# Patient Record
Sex: Female | Born: 1955 | Race: White | Hispanic: No | Marital: Married | Smoking: Former smoker
Health system: Southern US, Community
[De-identification: ages and names within clinical notes are randomized; demographics above are authoritative.]

## PROBLEM LIST (undated history)

## (undated) DIAGNOSIS — Z8719 Personal history of other diseases of the digestive system: Secondary | ICD-10-CM

## (undated) DIAGNOSIS — K529 Noninfective gastroenteritis and colitis, unspecified: Secondary | ICD-10-CM

## (undated) DIAGNOSIS — J40 Bronchitis, not specified as acute or chronic: Secondary | ICD-10-CM

## (undated) DIAGNOSIS — E785 Hyperlipidemia, unspecified: Secondary | ICD-10-CM

## (undated) HISTORY — PX: MOHS SURGERY: SUR867

## (undated) HISTORY — DX: Bronchitis, not specified as acute or chronic: J40

## (undated) HISTORY — DX: Personal history of other diseases of the digestive system: Z87.19

## (undated) HISTORY — DX: Hyperlipidemia, unspecified: E78.5

## (undated) HISTORY — PX: APPENDECTOMY: SHX54

---
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
## 1981-04-11 HISTORY — PX: HERNIA REPAIR: SHX51

## 1994-04-11 HISTORY — PX: EYE SURGERY: SHX253

## 1999-06-09 ENCOUNTER — Other Ambulatory Visit: Admission: RE | Admit: 1999-06-09 | Discharge: 1999-06-09 | Payer: Self-pay | Admitting: Obstetrics and Gynecology

## 2000-06-14 ENCOUNTER — Other Ambulatory Visit: Admission: RE | Admit: 2000-06-14 | Discharge: 2000-06-14 | Payer: Self-pay | Admitting: Obstetrics and Gynecology

## 2003-01-29 ENCOUNTER — Encounter: Admission: RE | Admit: 2003-01-29 | Discharge: 2003-01-29 | Payer: Self-pay

## 2004-04-19 ENCOUNTER — Encounter: Admission: RE | Admit: 2004-04-19 | Discharge: 2004-04-19 | Payer: Self-pay

## 2004-07-28 ENCOUNTER — Encounter (INDEPENDENT_AMBULATORY_CARE_PROVIDER_SITE_OTHER): Payer: Self-pay | Admitting: Specialist

## 2004-07-28 ENCOUNTER — Ambulatory Visit (HOSPITAL_BASED_OUTPATIENT_CLINIC_OR_DEPARTMENT_OTHER): Admission: RE | Admit: 2004-07-28 | Discharge: 2004-07-28 | Payer: Self-pay | Admitting: General Surgery

## 2004-07-28 ENCOUNTER — Ambulatory Visit (HOSPITAL_COMMUNITY): Admission: RE | Admit: 2004-07-28 | Discharge: 2004-07-28 | Payer: Self-pay | Admitting: General Surgery

## 2004-10-13 ENCOUNTER — Other Ambulatory Visit: Admission: RE | Admit: 2004-10-13 | Discharge: 2004-10-13 | Payer: Self-pay | Admitting: *Deleted

## 2005-05-11 ENCOUNTER — Encounter: Admission: RE | Admit: 2005-05-11 | Discharge: 2005-05-11 | Payer: Self-pay | Admitting: *Deleted

## 2005-11-15 ENCOUNTER — Other Ambulatory Visit: Admission: RE | Admit: 2005-11-15 | Discharge: 2005-11-15 | Payer: Self-pay | Admitting: *Deleted

## 2006-04-11 HISTORY — PX: DILATION AND CURETTAGE OF UTERUS: SHX78

## 2006-08-04 ENCOUNTER — Ambulatory Visit: Payer: Self-pay | Admitting: Internal Medicine

## 2006-08-04 LAB — CONVERTED CEMR LAB
ALT: 14 units/L (ref 0–40)
AST: 18 units/L (ref 0–37)
Basophils Relative: 2.1 % — ABNORMAL HIGH (ref 0.0–1.0)
Bilirubin, Direct: 0.1 mg/dL (ref 0.0–0.3)
CO2: 31 meq/L (ref 19–32)
Chloride: 108 meq/L (ref 96–112)
Eosinophils Absolute: 0.1 10*3/uL (ref 0.0–0.6)
Eosinophils Relative: 1.7 % (ref 0.0–5.0)
GFR calc Af Amer: 113 mL/min
GFR calc non Af Amer: 93 mL/min
Glucose, Bld: 90 mg/dL (ref 70–99)
HCT: 36.9 % (ref 36.0–46.0)
Lymphocytes Relative: 30.8 % (ref 12.0–46.0)
MCV: 84.2 fL (ref 78.0–100.0)
Neutro Abs: 3.6 10*3/uL (ref 1.4–7.7)
Neutrophils Relative %: 58.4 % (ref 43.0–77.0)
Sodium: 144 meq/L (ref 135–145)
TSH: 1.24 microintl units/mL (ref 0.35–5.50)
Tissue Transglutaminase Ab, IgA: <3 units (ref <5)
Total Protein: 6.7 g/dL (ref 6.0–8.3)
WBC: 6.1 10*3/uL (ref 4.5–10.5)

## 2006-08-09 ENCOUNTER — Ambulatory Visit: Payer: Self-pay | Admitting: Internal Medicine

## 2006-08-09 ENCOUNTER — Encounter: Payer: Self-pay | Admitting: Internal Medicine

## 2006-08-15 ENCOUNTER — Ambulatory Visit: Payer: Self-pay | Admitting: Cardiology

## 2006-08-25 ENCOUNTER — Ambulatory Visit: Payer: Self-pay | Admitting: Internal Medicine

## 2007-11-25 ENCOUNTER — Emergency Department (HOSPITAL_COMMUNITY): Admission: EM | Admit: 2007-11-25 | Discharge: 2007-11-25 | Payer: Self-pay | Admitting: Emergency Medicine

## 2007-11-26 ENCOUNTER — Encounter (INDEPENDENT_AMBULATORY_CARE_PROVIDER_SITE_OTHER): Payer: Self-pay | Admitting: Surgery

## 2007-11-26 ENCOUNTER — Ambulatory Visit (HOSPITAL_COMMUNITY): Admission: EM | Admit: 2007-11-26 | Discharge: 2007-11-27 | Payer: Self-pay | Admitting: Emergency Medicine

## 2007-11-26 ENCOUNTER — Encounter: Admission: RE | Admit: 2007-11-26 | Discharge: 2007-11-26 | Payer: Self-pay | Admitting: Gastroenterology

## 2007-12-31 ENCOUNTER — Encounter: Admission: RE | Admit: 2007-12-31 | Discharge: 2007-12-31 | Payer: Self-pay | Admitting: Gastroenterology

## 2009-05-12 ENCOUNTER — Encounter: Admission: RE | Admit: 2009-05-12 | Discharge: 2009-05-12 | Payer: Self-pay | Admitting: Internal Medicine

## 2010-05-02 ENCOUNTER — Encounter: Payer: Self-pay | Admitting: *Deleted

## 2010-05-03 ENCOUNTER — Encounter: Payer: Self-pay | Admitting: *Deleted

## 2010-08-24 NOTE — Op Note (Signed)
Nancy Smith, Nancy Smith NO.:  [id number]   MEDICAL RECORD NO.:  [id number]          PATIENT TYPE:  OIB   LOCATION:  5123                         FACILITY:  [facility]   PHYSICIAN:  Ardeth Sportsman, MD     DATE OF BIRTH:  1956-01-14   DATE OF PROCEDURE:  DATE OF DISCHARGE:  11/27/2007                               OPERATIVE REPORT   PRIMARY CARE PHYSICIAN:  None.   GASTROENTEROLOGIST:  Jordan Hawks. Elnoria Howard, MD   SURGEON:  Ardeth Sportsman, MD   ASSISTANT:  None.   PREOPERATIVE DIAGNOSIS:  Acute appendicitis.   SECONDARY PREOPERATIVE DIAGNOSES:  1. Chronic upper abdominal pain.  2. Benign liver cysts.   POSTOPERATIVE DIAGNOSIS:  Acute perforated appendicitis with  suppuration.   SECONDARY POSTOPERATIVE DIAGNOSES:  1. Chronic upper abdominal pain.  2. Benign liver cysts.   PROCEDURE PERFORMED:  1. Diagnostic laparoscopy.  2. Laparoscopic appendectomy.   ANESTHESIA:  1. General anesthesia.  2. Local anesthetic and a field block on all port sites.   SPECIMENS:  Appendix.   DRAINS:  None.   ESTIMATED BLOOD LOSS:  Less than 5 mL.   COMPLICATIONS:  None.   INDICATIONS:  Ms. Gao is a 55 year old female with chronic upper  abdominal pain, history of lymphocytic colitis in the past.  She had  some worsening abdominal pain and went to the emergency room.  Her  workup was initially negative including an negative upper ultrasound.  She was sent over to see Dr. Jeani Hawking with Gastroenterology.  He  evaluated her and was more concerned about how her pain seemed more  severe.  He sent for a CT scan of the abdomen, which was consistent with  appendicitis, and therefore a surgical consultation was made.   Her pain seems more focal in the right lower quadrant.  She is rather  anxious, but we have been able to control her pain and anxiety.  The  anatomy and physiology of appendix function was explained.  Pathophysiology of appendicitis was explained.  Risks,  benefits, and  alternatives were discussed and recommendation was made for diagnostic  laparoscopy with appendectomy.  Risks, benefits, and alternatives were  discussed.  Questions were answered and she agreed to proceed.   OPERATIVE FINDINGS:  She had obvious appendicitis with suppuration  concerned with perforation.  She had some murky fluid down in her pelvis  but not in her right upper quadrant.  Her ovaries, fallopian tube,  adnexa and uterus were normal.  Her small bowel was completely normal.  The colon was normal.  Her liver did have some benign hepatic cyst, but  these have been noted in the past and were stable.  She had no evidence  of any hiatal hernia.  There is no evidence of any adhesions or hernias.   DESCRIPTION OF PROCEDURE:  Informed consent was confirmed.  The patient  was already on IV Unasyn.  She received IV pain medications and IV  fluids.  She underwent general anesthesia without any difficulty.  She  had a Foley catheter sterilely placed.  She was positioned supine, both  arms tucked.  Her abdomen is clipped, prepped, and draped in sterile  fashion.   A 5-mm port was placed in right upper quadrant with the patient's deep  reversed in Trendelenburg and right side up using optical entry  technique.  Capnoperitoneum on 15 mmHg provided a good intraabdominal  insufflation.  Under direct visualization, the 5-mm port was placed  periumbilically and a 12-mm port was placed in the suprapubic region  above the tunneled cephalad.   Camera inspection revealed findings as above.  I was able to free some  omental adhesions that were over the right lower quadrant.  I was able  to roll the right colon in the lateral to medial fashion.  The appendix  could be seen.  I was able to elevate it.  The appendiceal mesentery was  isolated and divided using ultrasonic dissection till I got to a good  healthy base.  I went ahead and stapled off the appendix using a  laparoscopic  stapler taking a healthy cuff of cecum.   Copious irrigation was done with over 4 liters, especially in the right  lower quadrant, pelvis, and right upper quadrant, with clear return at  the end.  I ran the small bowel from the terminal ileum all the way to  the ligament of Treitz.  It was normal except for some mild inflammation  of the ileum in the right lower quadrant from the periappendicitis .  Small bowel mesentery appeared normal as well.  Camera inspection  revealed up in the upper quadrant I could see some obvious benign simple  liver cysts.  There was no evidence of any other liver lesions.  The  gallbladder appeared normal.  There was no evidence of any gallbladder  wall thickening or other abnormalities.  I elevated her liver anteriorly  and I was able to see esophageal hiatus with no evidence of any hiatal  hernia.  Her stomach appeared normal with no obvious masses.  There is  no anterior loop adhesions or intra-abdominal adhesions.  Her adnexa  looked completely normal including bilateral ovaries, fallopian tubes,  and uterus as well, and otherwise normal.  There was no Meckel  diverticulum.  There was no inflamed terminal ileum.  There was no  obvious colitis.   Capnoperitoneum was evacuated and ports removed.  The suprapubic fascial  defect barely allowed my finger to pass and closed with a 0 Vicryl  stitch to the fascia.  The skin was closed using 4-0 Monocryl stitch.  Sterile dressing was applied.  The patient was extubated and was taken  to recovery room in stable condition.   I explained the operative findings to the patient's husband.      Ardeth Sportsman, MD  Electronically Signed     SCG/MEDQ  D:  11/26/2007  T:  11/27/2007  Job:  [id number]   cc:   Jordan Hawks. Elnoria Howard, MD

## 2010-08-24 NOTE — H&P (Signed)
NAMEALBERTIA, CARVIN NO.:  [id number]   MEDICAL RECORD NO.:  [id number]          PATIENT TYPE:  INP   LOCATION:  5123                         FACILITY:  [facility]   PHYSICIAN:  Ardeth Sportsman, MD     DATE OF BIRTH:  1956-01-27   DATE OF ADMISSION:  11/26/2007  DATE OF DISCHARGE:                              HISTORY & PHYSICAL   CHIEF COMPLAINT:  Right lower quadrant abdominal pain.   HISTORY OF PRESENT ILLNESS:  Ms. Caputi is a 55 year old white lady who  has a history of chronic epigastric and left upper quadrant abdominal  pain.  She states that she has had this for years.  She came to the  emergency department yesterday due to this pain, however, had a negative  workup with a negative abdominal ultrasound and negative lab.  At that  time, it was felt that she needed an upper endoscopy, and therefore, was  sent to see Dr. Elnoria Howard, to see with first GI doctor they could get her in  to see for an EGD.  However, today when she presented to his office  apparently, she was having quite a bit more abdominal pain, and at this  time, it was located more at her right lower quadrant.  Therefore, at  this time, he felt that she needed to have CT scan done to rule out  appendicitis.  The patient went to have the CT scan done, and at that  time, she was found to have acute appendicitis with no perforation at  that time.  Therefore, she was sent over here to [facility] Emergency  Department again, and we were asked to see the patient.  Currently at  this time, the patient has labs pending.  Also of note, while here on  initial exam, the patient was having some right lower quadrant abdominal  pain, however, was not in very much distress.  However, throughout the  interview process and after the abdominal exam, the patient began to  have severe pain and became hyperventilatory and severe nausea.   REVIEW OF SYSTEMS:  See HPI.  Otherwise, the patient is currently  complaining of headache  and severe nausea.  She also complains of some  chronic epigastric pain and left upper quadrant abdominal pain with an  unknown etiology.  Otherwise, all other systems are currently negative.   FAMILY HISTORY:  Noncontributory.   PAST MEDICAL HISTORY:  1. Lymphocytic colitis.  2. Melanoma of the leg and arm.  3. Chronic epigastric/left upper quadrant abdominal pain.   PAST SURGICAL HISTORY:  1. The patient has had a recent D&C performed.  2. Right inguinal hernia repair in 1986, I believe.  3. Tonsillectomy.  4. Excision of melanoma on the patient's left upper extremity.   SOCIAL HISTORY:  The patient is married.  She does not have any children  of her own; however, she does have step kids, as well as grandchildren.  Otherwise, she is a retired Engineer, site; however, she has foregone  retirement, and is now teaching again.  Otherwise, she denies any  alcohol, tobacco, or illicit drug  use.   ALLERGIES:  NKDA.   MEDICATIONS:  None.   PHYSICAL EXAMINATION:  GENERAL:  Ms. Ellett is a 55 year old white  female who originally was in no acute distress; however, as patient's  interview continued, she began to get in moderate distress and  hyperventilating due to pain and nausea.  HEENT:  Eyes, sclerae noninjected.  Pupils are equal, round, and  reactive to light.  Ears, nose, mouth, and throat:  Ears and nose with  no obvious masses or lesions.  No rhinorrhea.  Mouth is pink and moist.  Throat shows no exudate.  NECK:  Supple.  Trachea is midline.  No thyromegaly.  HEART:  Regular rate and rhythm.  Normal S1 and S2.  No murmurs,  gallops, or rubs were noted.  Plus 2 carotid, pedal, and regular pulses  bilaterally.  LUNGS:  Clear to auscultation bilaterally with no wheezes, rhonchi, or  rales noted.  Respiratory effort at that time on exam was nonlabored and  is currently still unlabored; however, she is somewhat tachypneic due to  pain and anxiety.  CHEST:  Symmetrical.   ABDOMEN:  Soft, nondistended, however, is very tender in the right lower  quadrant with positive rebound tenderness.  She has currently decreased  bowel sounds with a negative psoas and obturator signs.  Otherwise, she  does not have any obvious scars, masses, or hernias on her abdomen.  MUSCULOSKELETAL:  All 4 extremities are symmetrical with no cyanosis,  clubbing, or edema.  SKIN:  Warm and dry with no obvious masses, lesions, or rashes.  NEUROLOGIC:  Cranial nerves II through XII appeared to be grossly  intact.  Deep tendon reflex exam is deferred at this time.  PSYCH:  The patient is alert and oriented x3 with an appropriate affect.   LABORATORY DATA AND DIAGNOSTICS:  All labs are currently pending.  We  have ordered a CBC with this and a BMET.  CT scan of the abdomen and  pelvis shows acute appendicitis with periappendiceal inflammatory  stranding; however, it does not appear to be ruptured at this time.   IMPRESSION:  1. Acute appendicitis.  2. Lymphocytic colitis.  3. Melanoma, which has been excised.   PLAN:  At this time, we will plan to admit this patient and start her on  p.r.n. medicines such as Zofran, Phenergan, and morphine, as well as  Unasyn 3 grams IV q.6 h.  Otherwise, we will also start her on IV  fluids.  At this time, the patient has been informed that she will need  her appendix out.  I have explained to her about the laparoscopic  approach for an  appendectomy.  She understands this and wishes to  proceed.  I have also at this time discussed this patient with Dr.  Michaell Cowing, and he feels as well that this patient will need her appendix  out.  Otherwise, we have ordered labs to be drawn, and we will further  await those results.     Letha Cape, PA      Ardeth Sportsman, MD  Electronically Signed   KEO/MEDQ  D:  11/26/2007  T:  11/27/2007  Job:  [id number]

## 2010-08-24 NOTE — Assessment & Plan Note (Signed)
[facility]                         GASTROENTEROLOGY OFFICE NOTE   Nancy Smith, Nancy Smith                       MRN:          [id number]  DATE:08/25/2006                            DOB:          [birth year]    HISTORY:  Nancy Smith presents today for a followup. Nancy Smith is accompanied  by Nancy Smith husband. Nancy Smith is a 55 year old with a history of superficial  melanoma who was evaluated as a new patient August 04, 2006 regarding  chronic diarrhea and persistent right upper quadrant abdominal  discomfort (please see that dictation for details). Nancy Smith underwent  multiple laboratories including CBC, comprehensive metabolic panel,  thyroid stimulation hormone, and tissue transglutaminase antibody. These  were all normal. Nancy Smith also underwent stool studies which were negative  for enteric pathogens, ova and parasites, white blood cells and fecal  fat. Also, no evidence of clostridium difficile. Colonoscopy and upper  endoscopy were then performed. Colonoscopy including intubation of the  terminal ileum was entirely normal. However random biopsies of the colon  revealed evidence of lymphocytic colitis. I reviewed these with Dr. Jimmy Picket. Upper endoscopy was entirely normal. Duodenal biopsies were  normal. Due to the pathology results being known, Nancy Smith was placed on  metronidazole 250 mg q.i.d. for 1 week. Nancy Smith completed therapy. Nancy Smith was  set up for office followup at this time. It should also be noted, that  Nancy Smith underwent contrast enhanced CT scan of the abdomen and pelvis to  evaluate Nancy Smith persistent right upper quadrant pain as well as followup on  liver lesions seen on an outside ultrasound in October 2007.  Parenthetically, there was no evidence of gallbladder disease at that  time. 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 The hemangioma  measured 2.4 cm while the cyst measured up to 2.6 cm. It  was also noted  that Nancy Smith had a 1.1 cm left renal lesion that could not be characterized  further. MRI recommended. No intraabdominal pathology to explain pain.  The patient is accompanied today by Nancy Smith husband. Nancy Smith is quite anxious  and worried. First Nancy Smith tells me that after completing a course of  antibiotics Nancy Smith felt that maybe Nancy Smith diarrhea was better. Nancy Smith describes  three loose bowel movements in the morning, none at night. Cramping  seems to be better as well though Nancy Smith did say Nancy Smith was having more  diarrhea this morning. Nancy Smith also reports to me that Nancy Smith had vaginal  spotting today. Nancy Smith states Nancy Smith has not had a period for 2 years. This  concerns Nancy Smith. I reviewed the above findings with Nancy Smith. With regards to  the kidney lesion, Nancy Smith tells me that Nancy Smith has seen Dr. Laverle Patter before and  apparently is noted to have some lesion on Nancy Smith left kidney. I told Nancy Smith  that we will ask Nancy Smith to followup with him regarding this question.  Finally, a long discussion today regarding problems with anxieties and  worries. Nancy Smith has apparently been on Paxil and Lexapro in the past and  done well in terms of how they made Nancy Smith  feel. However, there was some  concern that Nancy Smith maybe gained weight on these agents is petrified of  gaining weight. This is interesting in that one of Nancy Smith chief complaints  is ongoing weight loss that concerns Nancy Smith. Nancy Smith has fears about all sorts  of different problems and continues to ask if this could be a problem  or that could be a problem. Nancy Smith continues with the fullness discomfort  in the right side. Nancy Smith is currently on no medications. Nancy Smith takes Xanax  at night rarely to help with insomnia.   PHYSICAL EXAMINATION:  GENERAL:  Anxious, depressed-appearing female in  no acute distress.  VITAL SIGNS:  Blood pressure is 104/64, heart rate 72, weight is 119.8  pounds (decreased 1.4 pounds).  HEENT:  Sclera anicteric, conjunctiva pink. Oral mucosa intact. No  adenopathy.  LUNGS:  Clear.  HEART:   Regular.  ABDOMEN:  Soft without tenderness, mass or hernia. Good bowel sounds  heard.  EXTREMITIES:  Without edema.   IMPRESSION:  1. A 6 month history of diarrhea due to mild lymphocytic (a variety of      microscopic colitis). Workup for other problems entirely negative.  2. Chronic right upper quadrant discomfort. No organic cause found.      Ongoing.  3. Lesion on left kidney as described.  4. New problem with vaginal spotting.  5. Significant anxiety with probable element of depression.   RECOMMENDATIONS:  1. Prescribed Levbid 1/2 to 1 p.o. b.i.d. as needed for cramping and      loose stools.  2. Prescribed Zoloft 50 mg daily for anxiety with depression. We      discussed the pros and cons. Nancy Smith agreed.  3. See Dr. Randell Patient regarding vaginal spotting.  4. See Dr. Laverle Patter regarding left kidney lesion. A copy of Nancy Smith CT scan      report was handed to Nancy Smith.  5. Reassurance.  6. GI office followup in 6 weeks. Nancy Smith may contact the office in the      interim for questions or problems.     [name]. Marina Goodell, MD     JNP/MedQ  DD: 08/25/2006  DT: 08/25/2006  Job #: [id number]   cc:   Almedia Balls. Randell Patient, M.D.  Ronald Pippins, MD  Crecencio Mc, M.D.

## 2010-08-27 NOTE — Op Note (Signed)
NAMETALYIA, ALLENDE              ACCOUNT NO.:  [id number]   MEDICAL RECORD NO.:  [id number]          PATIENT TYPE:  AMB   LOCATION:  DSC                          FACILITY:  [facility]   PHYSICIAN:  Rose Phi. Maple Hudson, M.D.   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   OPERATION PERFORMED:  Excision of melanoma with primary closure.   SURGEON:  Rose Phi. Maple Hudson, M.D.   ANESTHESIA:  Local.   DESCRIPTION OF PROCEDURE:  Patient placed on the operating table and left  upper arm prepped and draped in the usual fashion.  An elliptical incision  around the previous biopsy site was then outlined with a marking pencil and  the area thoroughly infiltrated with 1% Xylocaine with Adrenalin.  Incision  was made and it was excised.  This left a defect of about 3 x 1.5 cm.  It  was closed in a single layer of interrupted 4-0 nylon.  Dressing applied.  Patient then allowed to go home.      PRY/MEDQ  D:  07/28/2004  T:  07/28/2004  Job:  [id number]

## 2010-08-27 NOTE — Assessment & Plan Note (Signed)
[facility]                         GASTROENTEROLOGY OFFICE NOTE   Smith, Nancy                       MRN:          [id number]  DATE:08/04/2006                            DOB:          May 29, 1955    REFERRING PHYSICIAN:  Almedia Balls. Randell Patient, M.D.   REASON FOR CONSULTATION:  1. Chronic diarrhea.  2. Persistent right upper quadrant abdominal discomfort.   HISTORY:  This is a 55 year old white female with history of superficial  melanoma who is otherwise healthy and was in her usual state of good  health until late November of 2007, when after eating a Lance Muss sandwich  developed moderately severe mid abdominal pain.  Thereafter, she had  symptoms of early satiety with mild weight loss.  In December she  developed recurrent epigastric and mid abdominal pain.  Subsequently,  problems with daily diarrhea which is described as watery.  Occasionally  oily, occasionally floating, mostly sinking stools.  Symptoms may or may  not be exacerbated by food.  She has awoken at least once per week  during the night with cramping and the need to defecate.  She describes  the abdominal cramping as severe as well as the diarrhea.  Overall, she  has lost approximately 7 pounds.  She denies presence of blood.  She  also reports constant chronic nagging discomfort in the right upper  quadrant which is not affected by meals, activities or change in body  position.  Her only evaluation and workup was an abdominal ultrasound  obtained in 2007.  This revealed several small lesions in the liver said  to be consistent with cysts, as well as a 1.3 cm lesion in the right  liver said to be hemangioma.  Followup in 3-4 months recommended.  Gallbladder was said to be normal.  No otherwise abnormalities noted.  The patient denies fevers, chills, rash, joint aches.  No perirectal  complaints.  No prior history of similar problems.  No travel history  outside of the [place].  She  cannot tell that dietary change affects  her symptoms.  The patient did report taking Imodium on one occasion.  This did stop her diarrhea but resulted in significant bloating with  discomfort.   PAST MEDICAL HISTORY:  1. Superficial melanoma on the arm and leg on 2 separate occasions      within the past several years.  2. Status post repair of femoral hernia in 1986.  3. Tonsillectomy as a child.   ALLERGIES:  No known drug allergies.   CURRENT MEDICATIONS:  None.   FAMILY HISTORY:  Negative for gastrointestinal malignancy or  inflammatory bowel disease.  Parents with heart disease, mother with  alcoholism.   SOCIAL HISTORY:  The patient is married.  She has stepchildren and step-  grandchildren but no children of her own.  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  She does no smoke or use alcohol.   REVIEW OF SYSTEMS:  Per diagnostic evaluation form.   PHYSICAL EXAMINATION:  Well-appearing female in no acute distress.  Blood pressure 118/62, heart rate 72 and regular.  Weight is 121.2  pound.  She is 5 feet 5 inches in height.  HEENT:  Sclerae are anicteric, conjunctiva are pink, oral mucosa intact,  no adenopathy.  LUNGS:  Clear.  HEART:  Regular.  ABDOMEN:  Soft, with mild discomfort to palpation in the epigastric and  right upper quadrant region.  Good bowel sounds heard, no mass felt.  The extremities without edema.  RECTAL:  Deferred.  NEUROLOGICALLY:  She is intact.  SKIN:  Unremarkable except for solar injury.   IMPRESSION:  1. Chronic diarrhea with weight loss.  This persists now for 6 months.      No workup to date.  Multiple possible etiologies including      infectious, malabsorptive or infiltrative processes.  2. Chronic right upper quadrant pain.  3. Reported abnormalities on abdominal ultrasound in October of 2007      as discussed above.  This needs followup.    RECOMMENDATIONS:  1. Multiple laboratories today including CBC, thyroid studies,      comprehensive metabolic panel, and tissue transaminase antibody.  2. Multiple stool studies including stool for fat, leukocytes,      infectious agents, and occult blood.  3. Schedule colonoscopy with biopsies to rule out microscopic colitis,      and upper endoscopy with biopsies to rule out villous atrophy.  The      nature of the procedures as well as the risks, benefits, and      alternatives were discussed in detail.  She understood and agreed      to proceed.  4. We will plan followup CT scan of the abdomen and pelvis after the      above workup initiated.  This to evaluate pain but also follow up      hepatic abnormalities as discussed above.     Nancy Smith. Marina Goodell, MD  Electronically Signed    JNP/MedQ  DD: 08/04/2006  DT: 08/04/2006  Job #: [id number]   cc:   Almedia Balls. Randell Patient, M.D.  [address], [address], [place], [place] [address] Dr. Ronald Pippins,  [address]

## 2011-06-01 ENCOUNTER — Other Ambulatory Visit: Payer: Self-pay | Admitting: Dermatology

## 2012-06-05 ENCOUNTER — Other Ambulatory Visit: Payer: Self-pay | Admitting: Dermatology

## 2012-12-17 ENCOUNTER — Other Ambulatory Visit: Payer: Self-pay | Admitting: Dermatology

## 2013-04-24 ENCOUNTER — Other Ambulatory Visit: Payer: Self-pay

## 2013-04-24 ENCOUNTER — Ambulatory Visit
Admission: RE | Admit: 2013-04-24 | Discharge: 2013-04-24 | Disposition: A | Discharge status: Home / Self Care | Payer: BC Managed Care – PPO | Source: Ambulatory Visit | Attending: Nurse Practitioner | Admitting: Nurse Practitioner

## 2013-04-24 ENCOUNTER — Other Ambulatory Visit: Payer: Self-pay | Admitting: Nurse Practitioner

## 2013-04-24 DIAGNOSIS — Z1231 Encounter for screening mammogram for malignant neoplasm of breast: Secondary | ICD-10-CM

## 2014-05-01 ENCOUNTER — Other Ambulatory Visit: Payer: Self-pay | Admitting: Dermatology

## 2015-05-25 ENCOUNTER — Other Ambulatory Visit: Payer: Self-pay

## 2015-05-25 DIAGNOSIS — Z1231 Encounter for screening mammogram for malignant neoplasm of breast: Secondary | ICD-10-CM

## 2015-05-27 ENCOUNTER — Ambulatory Visit
Admission: RE | Admit: 2015-05-27 | Discharge: 2015-05-27 | Disposition: A | Discharge status: Home / Self Care | Payer: BC Managed Care – PPO | Source: Ambulatory Visit

## 2015-05-27 DIAGNOSIS — Z1231 Encounter for screening mammogram for malignant neoplasm of breast: Secondary | ICD-10-CM

## 2015-05-29 ENCOUNTER — Other Ambulatory Visit: Payer: Self-pay | Admitting: Internal Medicine

## 2015-05-29 DIAGNOSIS — R928 Other abnormal and inconclusive findings on diagnostic imaging of breast: Secondary | ICD-10-CM

## 2015-06-04 ENCOUNTER — Ambulatory Visit
Admission: RE | Admit: 2015-06-04 | Discharge: 2015-06-04 | Disposition: A | Discharge status: Home / Self Care | Payer: BC Managed Care – PPO | Source: Ambulatory Visit | Attending: Internal Medicine | Admitting: Internal Medicine

## 2015-06-04 DIAGNOSIS — R928 Other abnormal and inconclusive findings on diagnostic imaging of breast: Secondary | ICD-10-CM

## 2016-06-07 ENCOUNTER — Encounter: Payer: Self-pay | Admitting: Internal Medicine

## 2017-11-30 ENCOUNTER — Ambulatory Visit
Admission: RE | Admit: 2017-11-30 | Discharge: 2017-11-30 | Disposition: A | Discharge status: Home / Self Care | Payer: BC Managed Care – PPO | Source: Ambulatory Visit | Attending: Internal Medicine | Admitting: Internal Medicine

## 2017-11-30 ENCOUNTER — Other Ambulatory Visit: Payer: Self-pay | Admitting: Internal Medicine

## 2017-11-30 DIAGNOSIS — Z1231 Encounter for screening mammogram for malignant neoplasm of breast: Secondary | ICD-10-CM

## 2018-01-02 ENCOUNTER — Encounter: Payer: Self-pay | Admitting: Family Medicine

## 2018-01-02 ENCOUNTER — Ambulatory Visit (INDEPENDENT_AMBULATORY_CARE_PROVIDER_SITE_OTHER): Payer: BC Managed Care – PPO

## 2018-01-02 ENCOUNTER — Ambulatory Visit: Payer: BC Managed Care – PPO | Admitting: Family Medicine

## 2018-01-02 ENCOUNTER — Other Ambulatory Visit: Payer: Self-pay

## 2018-01-02 VITALS — BP 116/70 | HR 66 | Temp 99.2°F | Ht 65.0 in | Wt 112.8 lb

## 2018-01-02 DIAGNOSIS — R05 Cough: Secondary | ICD-10-CM

## 2018-01-02 DIAGNOSIS — J22 Unspecified acute lower respiratory infection: Secondary | ICD-10-CM

## 2018-01-02 DIAGNOSIS — R059 Cough, unspecified: Secondary | ICD-10-CM

## 2018-01-02 DIAGNOSIS — J9801 Acute bronchospasm: Secondary | ICD-10-CM | POA: Diagnosis not present

## 2018-01-02 MED ORDER — AZITHROMYCIN 250 MG PO TABS: ORAL_TABLET | ORAL | 0 refills | Status: DC

## 2018-01-02 MED ORDER — PREDNISONE 20 MG PO TABS: 40.0000 mg | ORAL_TABLET | Freq: Every day | ORAL | 0 refills | Status: DC

## 2018-01-02 MED ORDER — ALBUTEROL SULFATE (2.5 MG/3ML) 0.083% IN NEBU
2.5000 mg | INHALATION_SOLUTION | Freq: Once | RESPIRATORY_TRACT | Status: AC
  Administered 2018-01-02: 2.5 mg via RESPIRATORY_TRACT

## 2018-01-02 MED ORDER — ALBUTEROL SULFATE 108 (90 BASE) MCG/ACT IN AEPB: 1.0000 | INHALATION_SPRAY | RESPIRATORY_TRACT | 0 refills | Status: DC | PRN

## 2018-01-02 NOTE — Patient Instructions (Addendum)
Start prednisone and azithromycin for cough and possible bronchitis.  Albuterol inhaler up to every 4 hours as needed for wheezing shortness of breath.  If you are not improving in the next 48 to 72 hours, recheck.  Return to the clinic or go to the nearest emergency room if any of your symptoms worsen or new symptoms occur.  Thank you for coming in today.   Bronchospasm, Adult Bronchospasm is a tightening of the airways going into the lungs. During an episode, it may be harder to breathe. You may cough, and you may make a whistling sound when you breathe (wheeze). This condition often affects people with asthma. What are the causes? This condition is caused by swelling and irritation in the airways. It can be triggered by:  An infection (common).  Seasonal allergies.  An allergic reaction.  Exercise.  Irritants. These include pollution, cigarette smoke, strong odors, aerosol sprays, and paint fumes.  Weather changes. Winds increase molds and pollens in the air. Cold air may cause swelling.  Stress and emotional upset.  What are the signs or symptoms? Symptoms of this condition include:  Wheezing. If the episode was triggered by an allergy, wheezing may start right away or hours later.  Nighttime coughing.  Frequent or severe coughing with a simple cold.  Chest tightness.  Shortness of breath.  Decreased ability to exercise.  How is this diagnosed? This condition is usually diagnosed with a review of your medical history and a physical exam. Tests, such as lung function tests, are sometimes done to look for other conditions. The need for a chest X-ray depends on where the wheezing occurs and whether it is the first time you have wheezed. How is this treated? This condition may be treated with:  Inhaled medicines. These open up the airways and help you breathe. They can be taken with an inhaler or a nebulizer device.  Corticosteroid medicines. These may be given for  severe bronchospasm, usually when it is associated with asthma.  Avoiding triggers, such as irritants, infection, or allergies.  Follow these instructions at home: Medicines  Take over-the-counter and prescription medicines only as told by your health care provider.  If you need to use an inhaler or nebulizer to take your medicine, ask your health care provider to explain how to use it correctly. If you were given a spacer, always use it with your inhaler. Lifestyle  Reduce the number of triggers in your home. To do this: ? Change your heating and air conditioning filter at least once a month. ? Limit your use of fireplaces and wood stoves. ? Do not smoke. Do not allow smoking in your home. ? Avoid using perfumes and fragrances. ? Get rid of pests, such as roaches and mice, and their droppings. ? Remove any mold from your home. ? Keep your house clean and dust free. Use unscented cleaning products. ? Replace carpet with wood, tile, or vinyl flooring. Carpet can trap dander and dust. ? Use allergy-proof pillows, mattress covers, and box spring covers. ? Wash bed sheets and blankets every week in hot water. Dry them in a dryer. ? Use blankets that are made of polyester or cotton. ? Wash your hands often. ? Do not allow pets in your bedroom.  Avoid breathing in cold air when you exercise. General instructions  Have a plan for seeking medical care. Know when to call your health care provider and local emergency services, and where to get emergency care.  Stay up to date  on your immunizations.  When you have an episode of bronchospasm, stay calm. Try to relax and breathe more slowly.  If you have asthma, make sure you have an asthma action plan.  Keep all follow-up visits as told by your health care provider. This is important. Contact a health care provider if:  You have muscle aches.  You have chest pain.  The mucus that you cough up (sputum) changes from clear or white to  yellow, green, gray, or bloody.  You have a fever.  Your sputum gets thicker. Get help right away if:  Your wheezing and coughing get worse, even after you take your prescribed medicines.  It gets even harder to breathe.  You develop severe chest pain. Summary  Bronchospasm is a tightening of the airways going into the lungs.  During an episode of bronchospasm, you may have a harder time breathing. You may cough and make a whistling sound when you breathe (wheeze).  Avoid exposure to triggers such as smoke, dust, mold, animal dander, and fragrances.  When you have an episode of bronchospasm, stay calm. Try to relax and breathe more slowly. This information is not intended to replace advice given to you by your health care provider. Make sure you discuss any questions you have with your health care provider. Document Released: 03/31/2003 Document Revised: 03/24/2016 Document Reviewed: 03/24/2016 Elsevier Interactive Patient Education  2017 [employer]  Cough, Adult Coughing is a reflex that clears your throat and your airways. Coughing helps to heal and protect your lungs. It is normal to cough occasionally, but a cough that happens with other symptoms or lasts a long time may be a sign of a condition that needs treatment. A cough may last only 2-3 weeks (acute), or it may last longer than 8 weeks (chronic). What are the causes? Coughing is commonly caused by:  Breathing in substances that irritate your lungs.  A viral or bacterial respiratory infection.  Allergies.  Asthma.  Postnasal drip.  Smoking.  Acid backing up from the stomach into the esophagus (gastroesophageal reflux).  Certain medicines.  Chronic lung problems, including COPD (or rarely, lung cancer).  Other medical conditions such as heart failure.  Follow these instructions at home: Pay attention to any changes in your symptoms. Take these actions to help with your discomfort:  Take medicines only  as told by your health care provider. ? If you were prescribed an antibiotic medicine, take it as told by your health care provider. Do not stop taking the antibiotic even if you start to feel better. ? Talk with your health care provider before you take a cough suppressant medicine.  Drink enough fluid to keep your urine clear or pale yellow.  If the air is dry, use a cold steam vaporizer or humidifier in your bedroom or your home to help loosen secretions.  Avoid anything that causes you to cough at work or at home.  If your cough is worse at night, try sleeping in a semi-upright position.  Avoid cigarette smoke. If you smoke, quit smoking. If you need help quitting, ask your health care provider.  Avoid caffeine.  Avoid alcohol.  Rest as needed.  Contact a health care provider if:  You have new symptoms.  You cough up pus.  Your cough does not get better after 2-3 weeks, or your cough gets worse.  You cannot control your cough with suppressant medicines and you are losing sleep.  You develop pain that is getting worse or pain  that is not controlled with pain medicines.  You have a fever.  You have unexplained weight loss.  You have night sweats. Get help right away if:  You cough up blood.  You have difficulty breathing.  Your heartbeat is very fast. This information is not intended to replace advice given to you by your health care provider. Make sure you discuss any questions you have with your health care provider. Document Released: 09/24/2010 Document Revised: 09/03/2015 Document Reviewed: 06/04/2014 Elsevier Interactive Patient Education  [employer].    If you have lab work done today you will be contacted with your lab results within the next 2 weeks.  If you have not heard from [place] then please contact us. The fastest way to get your results is to register for My Chart.   IF you received an x-ray today, you will receive an invoice from [facility]  Radiology. Please contact [facility] Radiology at [phone] with questions or concerns regarding your invoice.   IF you received labwork today, you will receive an invoice from [place]. Please contact [facility] at [phone] with questions or concerns regarding your invoice.   Our billing staff will not be able to assist you with questions regarding bills from these companies.  You will be contacted with the lab results as soon as they are available. The fastest way to get your results is to activate your My Chart account. Instructions are located on the last page of this paperwork. If you have not heard from [place] regarding the results in 2 weeks, please contact this office.

## 2018-01-02 NOTE — Progress Notes (Signed)
Subjective:  By signing my name below, I, Essence Howell, attest that this documentation has been prepared under the direction and in the presence of Nancy Agreste, MD Electronically Signed: Ladene Artist, ED Scribe 01/02/2018 at 12:33 PM.   Patient ID: Nancy Smith, female    DOB: 1955-06-15, 62 y.o.   MRN: [id number]  Chief Complaint  Patient presents with  . Cough  . Nasal Congestion    since saturday a week ago (10 days)   HPI [name] is a 62 y.o. female who presents to Primary Care at [facility] complaining of nasal congestion x 10 days. Pt reports associated symptoms of cough that has improved, wheezing, rhinorrhea, sinus pressure, HA, hot and cold spells today. She has tried Mucinex and hot showers without significant relief. Denies fever, sore throat, cp, h/o asthma, h/o cardiac issues. No sick contacts.  There are no active problems to display for this patient.  No past medical history on file.  No Known Allergies Prior to Admission medications   Not on File   Social History   Socioeconomic History  . Marital status: Married    Spouse name: Not on file  . Number of children: Not on file  . Years of education: Not on file  . Highest education level: Not on file  Occupational History  . Not on file  Social Needs  . Financial resource strain: Not on file  . Food insecurity:    Worry: Not on file    Inability: Not on file  . Transportation needs:    Medical: Not on file    Non-medical: Not on file  Tobacco Use  . Smoking status: Never Smoker  . Smokeless tobacco: Never Used  Substance and Sexual Activity  . Alcohol use: Not on file  . Drug use: Not on file  . Sexual activity: Not on file  Lifestyle  . Physical activity:    Days per week: Not on file    Minutes per session: Not on file  . Stress: Not on file  Relationships  . Social connections:    Talks on phone: Not on file    Gets together: Not on file    Attends religious service: Not on file     Active member of club or organization: Not on file    Attends meetings of clubs or organizations: Not on file    Relationship status: Not on file  . Intimate partner violence:    Fear of current or ex partner: Not on file    Emotionally abused: Not on file    Physically abused: Not on file    Forced sexual activity: Not on file  Other Topics Concern  . Not on file  Social History Narrative  . Not on file   Review of Systems  Constitutional: Positive for chills and diaphoresis. Negative for fever.  HENT: Positive for congestion, rhinorrhea and sinus pressure. Negative for sore throat.   Respiratory: Positive for cough and wheezing.   Cardiovascular: Negative for chest pain.  Neurological: Positive for headaches.      Objective:   Physical Exam  Constitutional: She is oriented to person, place, and time. She appears well-developed and well-nourished. No distress.  HENT:  Head: Normocephalic and atraumatic.  Right Ear: Hearing, tympanic membrane, external ear and ear canal normal.  Left Ear: Hearing, tympanic membrane, external ear and ear canal normal.  Nose: Left sinus exhibits frontal sinus tenderness.  Mouth/Throat: Oropharynx is clear and moist. No oropharyngeal exudate.  Eyes: Pupils are equal, round, and reactive to light. Conjunctivae and EOM are normal.  Cardiovascular: Normal rate, regular rhythm, normal heart sounds and intact distal pulses.  No murmur heard. Pulmonary/Chest: Effort normal. No respiratory distress. She has wheezes. She has no rhonchi.  Diffuse expiratory wheeze.  Neurological: She is alert and oriented to person, place, and time.  Skin: Skin is warm and dry. No rash noted.  Psychiatric: She has a normal mood and affect. Her behavior is normal.  Vitals reviewed.  Vitals:   01/02/18 1158  BP: 116/70  Pulse: 66  Temp: 99.2 F (37.3 C)  TempSrc: Oral  SpO2: 95%  Weight: 112 lb 12.8 oz (51.2 kg)  Height: 5\' 5"  (1.651 m)   Albuterol 2.5 mg neb  given, some improved aeration with some persistent wheeze after  Dg Chest 2 View  Result Date: 01/02/2018 CLINICAL DATA:  Cough and chills with wheezing EXAM: CHEST - 2 VIEW COMPARISON:  None. FINDINGS: There is no edema or consolidation. Heart size and pulmonary vascularity are normal. No adenopathy. There is lower thoracic dextroscoliosis. IMPRESSION: No edema or consolidation. Electronically Signed   By: Lowella Grip III M.D.   On: 01/02/2018 13:00       Assessment & Plan:    Nancy Smith is a 62 y.o. female Cough - Plan: DG Chest 2 View  Bronchospasm - Plan: albuterol (PROVENTIL) (2.5 MG/3ML) 0.083% nebulizer solution 2.5 mg, Albuterol Sulfate (PROAIR RESPICLICK) 409 (90 Base) MCG/ACT AEPB  LRTI (lower respiratory tract infection) - Plan: predniSONE (DELTASONE) 20 MG tablet, Albuterol Sulfate (PROAIR RESPICLICK) 735 (90 Base) MCG/ACT AEPB, azithromycin (ZITHROMAX) 250 MG tablet  Possible asthmatic bronchitis/reactive airway.  Less likely CAP with reassuring x-ray.  Some improvement with albuterol neb.  Persistent wheeze but vitals reassuring and did have some improvement  -Z-Pak #1 for atypical coverage, prednisone 40 mg daily x5 days, potential side effects and risk discussed, albuterol inhaler every 4 hours as needed with RTC precautions if frequent/persistent need in 48 to 72 hours.  RTC precautions if worsening sooner.   Meds ordered this encounter  Medications  . albuterol (PROVENTIL) (2.5 MG/3ML) 0.083% nebulizer solution 2.5 mg  . predniSONE (DELTASONE) 20 MG tablet    Sig: Take 2 tablets (40 mg total) by mouth daily with breakfast.    Dispense:  10 tablet    Refill:  0  . Albuterol Sulfate (PROAIR RESPICLICK) 329 (90 Base) MCG/ACT AEPB    Sig: Inhale 1 Inhaler into the lungs every 4 (four) hours as needed.    Dispense:  1 each    Refill:  0  . azithromycin (ZITHROMAX) 250 MG tablet    Sig: Take 2 pills by mouth on day 1, then 1 pill by mouth per day on days 2  through 5.    Dispense:  6 tablet    Refill:  0   Patient Instructions    Start prednisone and azithromycin for cough and possible bronchitis.  Albuterol inhaler up to every 4 hours as needed for wheezing shortness of breath.  If you are not improving in the next 48 to 72 hours, recheck.  Return to the clinic or go to the nearest emergency room if any of your symptoms worsen or new symptoms occur.  Thank you for coming in today.   Bronchospasm, Adult Bronchospasm is a tightening of the airways going into the lungs. During an episode, it may be harder to breathe. You may cough, and you may make a whistling sound when  you breathe (wheeze). This condition often affects people with asthma. What are the causes? This condition is caused by swelling and irritation in the airways. It can be triggered by:  An infection (common).  Seasonal allergies.  An allergic reaction.  Exercise.  Irritants. These include pollution, cigarette smoke, strong odors, aerosol sprays, and paint fumes.  Weather changes. Winds increase molds and pollens in the air. Cold air may cause swelling.  Stress and emotional upset.  What are the signs or symptoms? Symptoms of this condition include:  Wheezing. If the episode was triggered by an allergy, wheezing may start right away or hours later.  Nighttime coughing.  Frequent or severe coughing with a simple cold.  Chest tightness.  Shortness of breath.  Decreased ability to exercise.  How is this diagnosed? This condition is usually diagnosed with a review of your medical history and a physical exam. Tests, such as lung function tests, are sometimes done to look for other conditions. The need for a chest X-ray depends on where the wheezing occurs and whether it is the first time you have wheezed. How is this treated? This condition may be treated with:  Inhaled medicines. These open up the airways and help you breathe. They can be taken with an  inhaler or a nebulizer device.  Corticosteroid medicines. These may be given for severe bronchospasm, usually when it is associated with asthma.  Avoiding triggers, such as irritants, infection, or allergies.  Follow these instructions at home: Medicines  Take over-the-counter and prescription medicines only as told by your health care provider.  If you need to use an inhaler or nebulizer to take your medicine, ask your health care provider to explain how to use it correctly. If you were given a spacer, always use it with your inhaler. Lifestyle  Reduce the number of triggers in your home. To do this: ? Change your heating and air conditioning filter at least once a month. ? Limit your use of fireplaces and wood stoves. ? Do not smoke. Do not allow smoking in your home. ? Avoid using perfumes and fragrances. ? Get rid of pests, such as roaches and mice, and their droppings. ? Remove any mold from your home. ? Keep your house clean and dust free. Use unscented cleaning products. ? Replace carpet with wood, tile, or vinyl flooring. Carpet can trap dander and dust. ? Use allergy-proof pillows, mattress covers, and box spring covers. ? Wash bed sheets and blankets every week in hot water. Dry them in a dryer. ? Use blankets that are made of polyester or cotton. ? Wash your hands often. ? Do not allow pets in your bedroom.  Avoid breathing in cold air when you exercise. General instructions  Have a plan for seeking medical care. Know when to call your health care provider and local emergency services, and where to get emergency care.  Stay up to date on your immunizations.  When you have an episode of bronchospasm, stay calm. Try to relax and breathe more slowly.  If you have asthma, make sure you have an asthma action plan.  Keep all follow-up visits as told by your health care provider. This is important. Contact a health care provider if:  You have muscle aches.  You have  chest pain.  The mucus that you cough up (sputum) changes from clear or white to yellow, green, gray, or bloody.  You have a fever.  Your sputum gets thicker. Get help right away if:  Your wheezing  and coughing get worse, even after you take your prescribed medicines.  It gets even harder to breathe.  You develop severe chest pain. Summary  Bronchospasm is a tightening of the airways going into the lungs.  During an episode of bronchospasm, you may have a harder time breathing. You may cough and make a whistling sound when you breathe (wheeze).  Avoid exposure to triggers such as smoke, dust, mold, animal dander, and fragrances.  When you have an episode of bronchospasm, stay calm. Try to relax and breathe more slowly. This information is not intended to replace advice given to you by your health care provider. Make sure you discuss any questions you have with your health care provider. Document Released: 03/31/2003 Document Revised: 03/24/2016 Document Reviewed: 03/24/2016 Elsevier Interactive Patient Education  2017 [employer] Inc.  Cough, Adult Coughing is a reflex that clears your throat and your airways. Coughing helps to heal and protect your lungs. It is normal to cough occasionally, but a cough that happens with other symptoms or lasts a long time may be a sign of a condition that needs treatment. A cough may last only 2-3 weeks (acute), or it may last longer than 8 weeks (chronic). What are the causes? Coughing is commonly caused by:  Breathing in substances that irritate your lungs.  A viral or bacterial respiratory infection.  Allergies.  Asthma.  Postnasal drip.  Smoking.  Acid backing up from the stomach into the esophagus (gastroesophageal reflux).  Certain medicines.  Chronic lung problems, including COPD (or rarely, lung cancer).  Other medical conditions such as heart failure.  Follow these instructions at home: Pay attention to any changes in your  symptoms. Take these actions to help with your discomfort:  Take medicines only as told by your health care provider. ? If you were prescribed an antibiotic medicine, take it as told by your health care provider. Do not stop taking the antibiotic even if you start to feel better. ? Talk with your health care provider before you take a cough suppressant medicine.  Drink enough fluid to keep your urine clear or pale yellow.  If the air is dry, use a cold steam vaporizer or humidifier in your bedroom or your home to help loosen secretions.  Avoid anything that causes you to cough at work or at home.  If your cough is worse at night, try sleeping in a semi-upright position.  Avoid cigarette smoke. If you smoke, quit smoking. If you need help quitting, ask your health care provider.  Avoid caffeine.  Avoid alcohol.  Rest as needed.  Contact a health care provider if:  You have new symptoms.  You cough up pus.  Your cough does not get better after 2-3 weeks, or your cough gets worse.  You cannot control your cough with suppressant medicines and you are losing sleep.  You develop pain that is getting worse or pain that is not controlled with pain medicines.  You have a fever.  You have unexplained weight loss.  You have night sweats. Get help right away if:  You cough up blood.  You have difficulty breathing.  Your heartbeat is very fast. This information is not intended to replace advice given to you by your health care provider. Make sure you discuss any questions you have with your health care provider. Document Released: 09/24/2010 Document Revised: 09/03/2015 Document Reviewed: 06/04/2014 Elsevier Interactive Patient Education  [employer].    If you have lab work done today you will  be contacted with your lab results within the next 2 weeks.  If you have not heard from [place] then please contact us. The fastest way to get your results is to register for My  Chart.   IF you received an x-ray today, you will receive an invoice from [facility] Radiology. Please contact [facility] Radiology at [phone] with questions or concerns regarding your invoice.   IF you received labwork today, you will receive an invoice from [facility]. Please contact [facility] at [phone] with questions or concerns regarding your invoice.   Our billing staff will not be able to assist you with questions regarding bills from these companies.  You will be contacted with the lab results as soon as they are available. The fastest way to get your results is to activate your My Chart account. Instructions are located on the last page of this paperwork. If you have not heard from [place] regarding the results in 2 weeks, please contact this office.       I personally performed the services described in this documentation, which was scribed in my presence. The recorded information has been reviewed and considered for accuracy and completeness, addended by me as needed, and agree with information above.  Signed,   Merri Ray, MD [facility] at [facility].  01/02/18 1:45 PM

## 2018-05-08 ENCOUNTER — Encounter: Payer: Self-pay | Admitting: Physician Assistant

## 2018-05-08 LAB — HM COLONOSCOPY

## 2018-05-10 ENCOUNTER — Other Ambulatory Visit: Payer: Self-pay | Admitting: Gastroenterology

## 2018-05-10 DIAGNOSIS — R1011 Right upper quadrant pain: Secondary | ICD-10-CM

## 2018-05-29 ENCOUNTER — Ambulatory Visit (HOSPITAL_COMMUNITY): Payer: BC Managed Care – PPO

## 2018-05-29 ENCOUNTER — Encounter (HOSPITAL_COMMUNITY): Payer: Self-pay

## 2018-05-30 ENCOUNTER — Ambulatory Visit (HOSPITAL_COMMUNITY)
Admission: RE | Admit: 2018-05-30 | Discharge: 2018-05-30 | Disposition: A | Discharge status: Home / Self Care | Payer: BC Managed Care – PPO | Source: Ambulatory Visit | Attending: Gastroenterology | Admitting: Gastroenterology

## 2018-05-30 DIAGNOSIS — R1011 Right upper quadrant pain: Secondary | ICD-10-CM | POA: Insufficient documentation

## 2018-06-04 ENCOUNTER — Other Ambulatory Visit (HOSPITAL_COMMUNITY): Payer: Self-pay | Admitting: Psychiatry

## 2018-06-04 ENCOUNTER — Other Ambulatory Visit (HOSPITAL_COMMUNITY): Payer: Self-pay | Admitting: Gastroenterology

## 2018-06-04 DIAGNOSIS — K769 Liver disease, unspecified: Secondary | ICD-10-CM

## 2018-06-05 ENCOUNTER — Ambulatory Visit (HOSPITAL_COMMUNITY)
Admission: RE | Admit: 2018-06-05 | Discharge: 2018-06-05 | Disposition: A | Discharge status: Home / Self Care | Payer: BC Managed Care – PPO | Source: Ambulatory Visit | Attending: Gastroenterology | Admitting: Gastroenterology

## 2018-06-05 DIAGNOSIS — R1010 Upper abdominal pain, unspecified: Secondary | ICD-10-CM | POA: Diagnosis not present

## 2018-06-05 MED ORDER — TECHNETIUM TC 99M MEBROFENIN IV KIT
4.9000 | PACK | Freq: Once | INTRAVENOUS | Status: AC | PRN
  Administered 2018-06-05: 4.9 via INTRAVENOUS

## 2018-06-08 ENCOUNTER — Ambulatory Visit (HOSPITAL_COMMUNITY)
Admission: RE | Admit: 2018-06-08 | Discharge: 2018-06-08 | Disposition: A | Discharge status: Home / Self Care | Payer: BC Managed Care – PPO | Source: Ambulatory Visit | Attending: Gastroenterology | Admitting: Gastroenterology

## 2018-06-08 DIAGNOSIS — K769 Liver disease, unspecified: Secondary | ICD-10-CM | POA: Diagnosis not present

## 2018-06-08 DIAGNOSIS — R1010 Upper abdominal pain, unspecified: Secondary | ICD-10-CM | POA: Diagnosis not present

## 2018-06-08 LAB — POCT I-STAT CREATININE: Creatinine, Ser: 0.8 mg/dL (ref 0.44–1.00)

## 2018-06-08 MED ORDER — GADOBUTROL 1 MMOL/ML IV SOLN
5.0000 mL | Freq: Once | INTRAVENOUS | Status: AC | PRN
  Administered 2018-06-08: 5 mL via INTRAVENOUS

## 2018-09-29 NOTE — Progress Notes (Signed)
[facility]  10/01/2018     CHIEF COMPLAINT Patient presents for Flashes/floaters   HISTORY OF PRESENT ILLNESS: Nancy Smith is a 63 y.o. female who presents to the clinic today for:   HPI    Flashes/floaters    In right eye.  This started 4 weeks ago.  Duration Intermittant.  Since onset it is gradually improving.  Associated Symptoms Floaters.  I, the attending physician,  performed the HPI with the patient and updated documentation appropriately.          Comments    Patient states about a month ago, she suddenly noticed a new floater in her right eye.  She denies any change in vision or pain in the eye and denies any flashes of light.  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       Last edited by Bernarda Caffey, MD on 10/01/2018  1:41 PM. (History)    pt states she saw Dr. Glennon Hamilton in [place], [place] for floaters OD, she states her and her husband split their time between [place] and [facility], she states her general ophthalmologist is in [place], [place], she states she saw him prior to seeing Dr. Glennon Hamilton, she states she woke up one morning and her vision was okay, she states as the day went on she started to see a lot of floaters and black spots and her vision was impaired, she states her vision has improved some since she saw Dr. 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 Kirstie Mirza, MD  HISTORICAL INFORMATION:   Selected notes from the MEDICAL RECORD NUMBER Referred by Dr. Kirstie Mirza for hemorrhagic PVD OD LEE: 06.01.20 Jearld Lesch) [BCVA: OD: 20/40+1 OS: 20/20-2] Ocular Hx-pterygium removal OS, cataracts, PVD OD, vitreous heme OD PMH-   CURRENT MEDICATIONS: No current outpatient medications on file.  (Ophthalmic Drugs)   No current facility-administered medications for this visit.  (Ophthalmic Drugs)   Current Outpatient Medications (Other)  Medication Sig  . Albuterol Sulfate (PROAIR RESPICLICK) 185 (90 Base) MCG/ACT AEPB Inhale 1 Inhaler into the lungs every 4 (four) hours as needed.  Marland Kitchen azithromycin (ZITHROMAX) 250 MG tablet Take 2 pills by mouth on day 1, then 1 pill by mouth per day on days 2 through 5.  . predniSONE (DELTASONE) 20 MG tablet Take 2 tablets (40 mg total) by mouth daily with breakfast.  . traZODone (DESYREL) 100 MG tablet TK 1 T PO HS   No current facility-administered medications for this visit.  (Other)      REVIEW OF SYSTEMS: ROS    Positive for: Eyes   Negative for: Constitutional, Gastrointestinal, Neurological, Skin, Genitourinary, Musculoskeletal, HENT, Endocrine, Cardiovascular, Respiratory, Psychiatric, Allergic/Imm, Heme/Lymph   Last edited by Doneen Poisson on 10/01/2018  9:35 AM. (History)       ALLERGIES No Known Allergies  PAST MEDICAL HISTORY History reviewed. No pertinent past medical history. History reviewed. No pertinent surgical history.  FAMILY HISTORY History reviewed. No pertinent family history.  SOCIAL HISTORY Social History   Tobacco Use  . Smoking status: Never Smoker  . Smokeless tobacco: Never Used  Substance Use Topics  . Alcohol use: Not on file  . Drug use: Not on file  OPHTHALMIC EXAM:  Base Eye Exam    Visual Acuity (Snellen - Linear)      Right Left   Dist cc 20/20 -1 20/20   Correction: Glasses       Tonometry (Tonopen, 9:46 AM)      Right Left   Pressure 11 14       Pupils      Dark Light Shape React APD   Right 4 3 Round Brisk 0   Left 4 3 Round Brisk 0       Extraocular Movement      Right Left    Full Full       Neuro/Psych    Oriented x3: Yes   Mood/Affect: Normal       Dilation    Both eyes: 1.0% Mydriacyl, 2.5% Phenylephrine @ 9:46 AM        Slit Lamp and  Fundus Exam    Slit Lamp Exam      Right Left   Lids/Lashes Dermatochalasis - upper lid, stray lashes lateral canthus upper lid Dermatochalasis - upper lid, stray lashes lateral canthus upper lid   Conjunctiva/Sclera Nasal and temporal Pinguecula Regressed pterygium nasal   Cornea 1+ Punctate epithelial erosions 1+ Punctate epithelial erosions   Anterior Chamber Deep and quiet Deep and quiet   Iris Round and dilated Round and dilated   Lens 2+ Nuclear sclerosis, 2+ Cortical cataract 2+ Nuclear sclerosis, 2+ Cortical cataract   Vitreous Vitreous syneresis, no pigment Vitreous syneresis, no pigment       Fundus Exam      Right Left   Disc Pink and Sharp Pink and Sharp   C/D Ratio 0.2 0.3   Macula Flat, Blunted foveal reflex, Retinal pigment epithelial mottling, No heme or edema Flat, Blunted foveal reflex, Retinal pigment epithelial mottling, trace Epiretinal membrane, No heme or edema   Vessels Mild Vascular attenuation, mild Tortuous, mild AV crossing changes Mild Vascular attenuation, mild Tortuous   Periphery Attached; no RT/RD Attached; no RT/RD        Refraction    Wearing Rx      Sphere Cylinder Axis Add   Right +0.00 +0.75 010 +2.50   Left +0.00 +0.75 168 +2.50   Age: 17 yr   Type: PROG       Manifest Refraction      Sphere Cylinder Axis Dist VA Add   Right +0.00 +0.75 010 20/20 +2.50   Left +0.00 +0.50 170 20/20 +2.50          IMAGING AND PROCEDURES  Imaging and Procedures for @TODAY @  OCT, Retina - OU - Both Eyes       Right Eye Quality was good. Central Foveal Thickness: 269. Progression has no prior data. Findings include normal foveal contour, no IRF, no SRF (Trace ERM, trace vitreous opacities).   Left Eye Quality was good. Central Foveal Thickness: 279. Progression has no prior data. Findings include no IRF, normal foveal contour, no SRF, vitreomacular adhesion .   Notes *Images captured and stored on drive  Diagnosis / Impression:  NFP, no IRF/SRF  OU  Clinical management:  See below  Abbreviations: NFP - Normal foveal profile. CME - cystoid macular edema. PED - pigment epithelial detachment. IRF - intraretinal fluid. SRF - subretinal fluid. EZ - ellipsoid zone. ERM - epiretinal membrane. ORA - outer retinal atrophy. ORT - outer retinal tubulation. SRHM - subretinal hyper-reflective material  ASSESSMENT/PLAN:    ICD-10-CM   1. Posterior vitreous detachment of right eye  H43.811   2. Vitreous hemorrhage of right eye ([place])  H43.11   3. Retinal edema  H35.81 OCT, Retina - OU - Both Eyes  4. Combined forms of age-related cataract of both eyes  H25.813     1,2. Hemorrhagic PVD OD  - onset ~September 10, 2018 (saw Dr. Glennon Hamilton in [place], [place])  - VH component appears to be resolved; pt reports persistent symptomatic floaters  - Discussed findings and prognosis  - No RT or RD on 360 peripheral exam  - Reviewed s/s of RT/RD  - Strict return precautions for any such RT/RD signs/symptoms  - f/u 3-4 weeks  3. No retinal edema on exam or OCT  4. Mixed form age related cataract OU  - The symptoms of cataract, surgical options, and treatments and risks were discussed with patient.  - discussed diagnosis and progression  - not yet visually significant  - monitor for now   Ophthalmic Meds Ordered this visit:  No orders of the defined types were placed in this encounter.      Return for f/u 3-4 weeks, hemorrhagic PVD OD, DFE, OCT.  There are no Patient Instructions on file for this visit.   Explained the diagnoses, plan, and follow up with the patient and they expressed understanding.  Patient expressed understanding of the importance of proper follow up care.   This document serves as a record of services personally performed by Gardiner Sleeper, MD, PhD. It was created on their behalf by Ernest Mallick, OA, an ophthalmic assistant. The creation of this record is the provider's dictation and/or activities during the  visit.    Electronically signed by: Ernest Mallick, OA  06.20.2020 5:30 PM    Gardiner Sleeper, M.D., Ph.D. Diseases & Surgery of the Retina and Vitreous [facility]  I have reviewed the above documentation for accuracy and completeness, and I agree with the above. Gardiner Sleeper, M.D., Ph.D. 10/01/18 5:30 PM    Abbreviations: M myopia (nearsighted); A astigmatism; H hyperopia (farsighted); P presbyopia; Mrx spectacle prescription;  CTL contact lenses; OD right eye; OS left eye; OU both eyes  XT exotropia; ET esotropia; PEK punctate epithelial keratitis; PEE punctate epithelial erosions; DES dry eye syndrome; MGD meibomian gland dysfunction; ATs artificial tears; PFAT's preservative free artificial tears; [place] nuclear sclerotic cataract; PSC posterior subcapsular cataract; ERM epi-retinal membrane; PVD posterior vitreous detachment; RD retinal detachment; DM diabetes mellitus; DR diabetic retinopathy; NPDR non-proliferative diabetic retinopathy; PDR proliferative diabetic retinopathy; CSME clinically significant macular edema; DME diabetic macular edema; dbh dot blot hemorrhages; CWS cotton wool spot; POAG primary open angle glaucoma; C/D cup-to-disc ratio; HVF humphrey visual field; GVF goldmann visual field; OCT optical coherence tomography; IOP intraocular pressure; BRVO Branch retinal vein occlusion; CRVO central retinal vein occlusion; CRAO central retinal artery occlusion; BRAO branch retinal artery occlusion; RT retinal tear; SB scleral buckle; PPV pars plana vitrectomy; VH Vitreous hemorrhage; PRP panretinal laser photocoagulation; IVK intravitreal kenalog; VMT vitreomacular traction; MH Macular hole;  NVD neovascularization of the disc; NVE neovascularization elsewhere; AREDS age related eye disease study; ARMD age related macular degeneration; POAG primary open angle glaucoma; EBMD epithelial/anterior basement membrane dystrophy; ACIOL anterior chamber intraocular lens;  IOL intraocular lens; PCIOL posterior chamber intraocular lens; Phaco/IOL phacoemulsification with intraocular lens placement; [place] photorefractive keratectomy; LASIK laser assisted in situ keratomileusis; HTN hypertension; DM diabetes mellitus; COPD chronic obstructive pulmonary disease

## 2018-10-01 ENCOUNTER — Ambulatory Visit (INDEPENDENT_AMBULATORY_CARE_PROVIDER_SITE_OTHER): Payer: BC Managed Care – PPO | Admitting: Ophthalmology

## 2018-10-01 ENCOUNTER — Encounter (INDEPENDENT_AMBULATORY_CARE_PROVIDER_SITE_OTHER): Payer: Self-pay | Admitting: Ophthalmology

## 2018-10-01 ENCOUNTER — Other Ambulatory Visit: Payer: Self-pay

## 2018-10-01 DIAGNOSIS — H3581 Retinal edema: Secondary | ICD-10-CM

## 2018-10-01 DIAGNOSIS — H4311 Vitreous hemorrhage, right eye: Secondary | ICD-10-CM

## 2018-10-01 DIAGNOSIS — H43811 Vitreous degeneration, right eye: Secondary | ICD-10-CM

## 2018-10-01 DIAGNOSIS — H25813 Combined forms of age-related cataract, bilateral: Secondary | ICD-10-CM

## 2018-10-21 NOTE — Progress Notes (Signed)
[facility]  10/22/2018     CHIEF COMPLAINT Patient presents for Retina Follow Up   HISTORY OF PRESENT ILLNESS: Nancy Smith is a 63 y.o. female who presents to the clinic today for:   HPI    Retina Follow Up    Patient presents with  Other.  In right eye.  This started 2 months ago.  Severity is mild.  Since onset it is gradually improving.  I, the attending physician,  performed the HPI with the patient and updated documentation appropriately.          Comments    F/U Hem. PVD OD. Patient states her vision is "getting better, floaters are becoming less" OD. Pt reports having lashes od growing inward that are bothering her, she would like to have them removed.       Last edited by Bernarda Caffey, MD on 10/23/2018 11:55 PM. (History)    pt states her floaters have cleared up, she states she only sees one every once in awhile  Referring physician: Dr. Kirstie Mirza, MD  HISTORICAL INFORMATION:   Selected notes from the MEDICAL RECORD NUMBER Referred by Dr. Kirstie Mirza for hemorrhagic PVD OD LEE: 06.01.20 Jearld Lesch) [BCVA: OD: 20/40+1 OS: 20/20-2] Ocular Hx-pterygium removal OS, cataracts, PVD OD, vitreous heme OD PMH-   CURRENT MEDICATIONS: No current outpatient medications on file. (Ophthalmic Drugs)   No current facility-administered medications for this visit.  (Ophthalmic Drugs)   Current Outpatient Medications (Other)  Medication Sig  . traZODone (DESYREL) 100 MG tablet TK 1 T PO HS  . Albuterol Sulfate (PROAIR RESPICLICK) 573 (90 Base) MCG/ACT AEPB Inhale 1 Inhaler into the lungs every 4 (four) hours as needed. (Patient not taking: Reported on 10/22/2018)  . azithromycin (ZITHROMAX) 250 MG tablet Take 2 pills by mouth on day 1, then 1 pill by mouth per day on days 2 through 5. (Patient not taking: Reported on 10/22/2018)  . predniSONE (DELTASONE) 20 MG tablet Take 2 tablets (40 mg total) by mouth daily with breakfast. (Patient not taking:  Reported on 10/22/2018)   No current facility-administered medications for this visit.  (Other)      REVIEW OF SYSTEMS: ROS    Positive for: Eyes   Negative for: Constitutional, Gastrointestinal, Neurological, Skin, Genitourinary, Musculoskeletal, HENT, Endocrine, Cardiovascular, Respiratory, Psychiatric, Allergic/Imm, Heme/Lymph   Last edited by Zenovia Jordan, LPN on 05/31/2540  7:06 AM. (History)       ALLERGIES No Known Allergies  PAST MEDICAL HISTORY History reviewed. No pertinent past medical history. History reviewed. No pertinent surgical history.  FAMILY HISTORY History reviewed. No pertinent family history.  SOCIAL HISTORY Social History   Tobacco Use  . Smoking status: Never Smoker  . Smokeless tobacco: Never Used  Substance Use Topics  . Alcohol use: Not on file  . Drug use: Not on file         OPHTHALMIC EXAM:  Base Eye Exam    Visual Acuity (Snellen - Linear)      Right Left   Dist cc 20/20 20/20   Dist ph cc NI NI   Correction: Glasses       Tonometry (Tonopen, 8:21 AM)      Right Left   Pressure 12 12       Pupils      Dark Light Shape React APD   Right 3 2 Round Brisk None   Left 3 2 Round Brisk None  Visual Fields (Counting fingers)      Left Right    Full Full       Extraocular Movement      Right Left    Full, Ortho Full, Ortho       Neuro/Psych    Oriented x3: Yes   Mood/Affect: Normal       Dilation    Both eyes: 1.0% Mydriacyl, 2.5% Phenylephrine @ 8:21 AM        Slit Lamp and Fundus Exam    Slit Lamp Exam      Right Left   Lids/Lashes Dermatochalasis - upper lid, stray lashes lateral canthus upper lid Dermatochalasis - upper lid, stray lashes lateral canthus upper lid   Conjunctiva/Sclera Nasal and temporal Pinguecula Regressed pterygium nasal   Cornea 1+ Punctate epithelial erosions 1+ Punctate epithelial erosions   Anterior Chamber Deep and quiet Deep and quiet   Iris Round and dilated Round and  dilated   Lens 2+ Nuclear sclerosis, 2+ Cortical cataract 2+ Nuclear sclerosis, 2+ Cortical cataract   Vitreous Vitreous syneresis, no pigment Vitreous syneresis, no pigment       Fundus Exam      Right Left   Disc Pink and Sharp Pink and Sharp   C/D Ratio 0.2 0.3   Macula Flat, Blunted foveal reflex, Retinal pigment epithelial mottling, No heme or edema Flat, Blunted foveal reflex, Retinal pigment epithelial mottling, trace Epiretinal membrane, No heme or edema   Vessels Mild Vascular attenuation, mild Tortuous, mild AV crossing changes Mild Vascular attenuation, mild Tortuous   Periphery Attached; no RT/RD Attached; no RT/RD          IMAGING AND PROCEDURES  Imaging and Procedures for @TODAY @  OCT, Retina - OU - Both Eyes       Right Eye Quality was good. Central Foveal Thickness: 266. Progression has been stable. Findings include normal foveal contour, no IRF, no SRF (Trace ERM, trace vitreous opacities).   Left Eye Quality was good. Central Foveal Thickness: 270. Progression has been stable. Findings include no IRF, normal foveal contour, no SRF, vitreomacular adhesion .   Notes *Images captured and stored on drive  Diagnosis / Impression:  NFP, no IRF/SRF OU  Clinical management:  See below  Abbreviations: NFP - Normal foveal profile. CME - cystoid macular edema. PED - pigment epithelial detachment. IRF - intraretinal fluid. SRF - subretinal fluid. EZ - ellipsoid zone. ERM - epiretinal membrane. ORA - outer retinal atrophy. ORT - outer retinal tubulation. SRHM - subretinal hyper-reflective material                 ASSESSMENT/PLAN:    ICD-10-CM   1. Posterior vitreous detachment of right eye  H43.811   2. Vitreous hemorrhage of right eye ([place])  H43.11   3. Retinal edema  H35.81 OCT, Retina - OU - Both Eyes  4. 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 Hemorrhagic PVD OD  - onset ~September 10, 2018 (saw Dr. Glennon Hamilton in [place], [place])  - VH component appears to be stably resolved on exam; pt reports persistent symptomatic floaters  - Discussed findings and prognosis  - No RT or RD on 360 peripheral exam  - Reviewed s/s of RT/RD  - Strict return precautions for any  such RT/RD signs/symptoms  - f/u 6 months  3. No retinal edema on exam or OCT  4. Mixed form age related cataract OU  - The symptoms of cataract, surgical options, and treatments and risks were discussed with patient.  - discussed diagnosis and progression  - not yet visually significant  - monitor for now  5. Trichiasis OU  - thicker, misdirected lashes in alteral canthus OU, rubbing and irritating lower lashes OU  - discussed findings and treatment options including forceps epilation  - pt wishes to have   - RBA of procedure discussed, questions answered  - misdirected lashes removed at slit lamp via jeweler's forceps  - f/u 2-4 weeks   Ophthalmic Meds Ordered this visit:  No orders of the defined types were placed in this encounter.      Return for f/u 2-4 weeks, distichiasis , DFE, OCT.  There are no Patient Instructions on file for this visit.   Explained the diagnoses, plan, and follow up with the patient and they expressed understanding.  Patient expressed understanding of the importance of proper follow up care.   This document serves as a record of services personally performed by Gardiner Sleeper, MD, PhD. It was created on their behalf by Ernest Mallick, OA, an ophthalmic assistant. The creation of this record is the provider's dictation and/or activities during the visit.    Electronically signed by: Ernest Mallick, OA 07.12.2020 11:56 PM    Gardiner Sleeper, M.D., Ph.D. Diseases & Surgery of the Retina and Vitreous [facility]  I have reviewed the above  documentation for accuracy and completeness, and I agree with the above. Gardiner Sleeper, M.D., Ph.D. 10/24/18 12:00 AM    Abbreviations: M myopia (nearsighted); A astigmatism; H hyperopia (farsighted); P presbyopia; Mrx spectacle prescription;  CTL contact lenses; OD right eye; OS left eye; OU both eyes  XT exotropia; ET esotropia; PEK punctate epithelial keratitis; PEE punctate epithelial erosions; DES dry eye syndrome; MGD meibomian gland dysfunction; ATs artificial tears; PFAT's preservative free artificial tears; [place] nuclear sclerotic cataract; PSC posterior subcapsular cataract; ERM epi-retinal membrane; PVD posterior vitreous detachment; RD retinal detachment; DM diabetes mellitus; DR diabetic retinopathy; NPDR non-proliferative diabetic retinopathy; PDR proliferative diabetic retinopathy; CSME clinically significant macular edema; DME diabetic macular edema; dbh dot blot hemorrhages; CWS cotton wool spot; POAG primary open angle glaucoma; C/D cup-to-disc ratio; HVF humphrey visual field; GVF goldmann visual field; OCT optical coherence tomography; IOP intraocular pressure; BRVO Branch retinal vein occlusion; CRVO central retinal vein occlusion; CRAO central retinal artery occlusion; BRAO branch retinal artery occlusion; RT retinal tear; SB scleral buckle; PPV pars plana vitrectomy; VH Vitreous hemorrhage; PRP panretinal laser photocoagulation; IVK intravitreal kenalog; VMT vitreomacular traction; MH Macular hole;  NVD neovascularization of the disc; NVE neovascularization elsewhere; AREDS age related eye disease study; ARMD age related macular degeneration; POAG primary open angle glaucoma; EBMD epithelial/anterior basement membrane dystrophy; ACIOL anterior chamber intraocular lens; IOL intraocular lens; PCIOL posterior chamber intraocular lens; Phaco/IOL phacoemulsification with intraocular lens placement; [place] photorefractive keratectomy; LASIK laser assisted in situ keratomileusis; HTN hypertension;  DM diabetes mellitus; COPD chronic obstructive pulmonary disease

## 2018-10-22 ENCOUNTER — Ambulatory Visit (INDEPENDENT_AMBULATORY_CARE_PROVIDER_SITE_OTHER): Payer: BC Managed Care – PPO | Admitting: Ophthalmology

## 2018-10-22 ENCOUNTER — Encounter (INDEPENDENT_AMBULATORY_CARE_PROVIDER_SITE_OTHER): Payer: Self-pay | Admitting: Ophthalmology

## 2018-10-22 ENCOUNTER — Other Ambulatory Visit: Payer: Self-pay

## 2018-10-22 DIAGNOSIS — H43811 Vitreous degeneration, right eye: Secondary | ICD-10-CM | POA: Diagnosis not present

## 2018-10-22 DIAGNOSIS — H4311 Vitreous hemorrhage, right eye: Secondary | ICD-10-CM

## 2018-10-22 DIAGNOSIS — H25813 Combined forms of age-related cataract, bilateral: Secondary | ICD-10-CM | POA: Diagnosis not present

## 2018-10-22 DIAGNOSIS — H02053 Trichiasis without entropian right eye, unspecified eyelid: Secondary | ICD-10-CM

## 2018-10-22 DIAGNOSIS — Q103 Other congenital malformations of eyelid: Secondary | ICD-10-CM

## 2018-10-22 DIAGNOSIS — H3581 Retinal edema: Secondary | ICD-10-CM

## 2018-10-22 DIAGNOSIS — H02056 Trichiasis without entropian left eye, unspecified eyelid: Secondary | ICD-10-CM

## 2018-10-22 DIAGNOSIS — H0289 Other specified disorders of eyelid: Secondary | ICD-10-CM

## 2018-10-30 ENCOUNTER — Telehealth (INDEPENDENT_AMBULATORY_CARE_PROVIDER_SITE_OTHER): Payer: Medicare Other | Admitting: Registered Nurse

## 2018-10-30 ENCOUNTER — Other Ambulatory Visit: Payer: Self-pay

## 2018-10-30 DIAGNOSIS — Z20822 Contact with and (suspected) exposure to covid-19: Secondary | ICD-10-CM

## 2018-10-30 DIAGNOSIS — J029 Acute pharyngitis, unspecified: Secondary | ICD-10-CM

## 2018-10-30 DIAGNOSIS — J302 Other seasonal allergic rhinitis: Secondary | ICD-10-CM

## 2018-10-30 DIAGNOSIS — Z20828 Contact with and (suspected) exposure to other viral communicable diseases: Secondary | ICD-10-CM

## 2018-10-30 MED ORDER — AMOXICILLIN-POT CLAVULANATE 875-125 MG PO TABS: 1.0000 | ORAL_TABLET | Freq: Two times a day (BID) | ORAL | 0 refills | Status: DC

## 2018-10-30 MED ORDER — CETIRIZINE HCL 10 MG PO TABS: 5.0000 mg | ORAL_TABLET | Freq: Every day | ORAL | 11 refills | Status: DC

## 2018-10-30 MED ORDER — FLUTICASONE PROPIONATE 50 MCG/ACT NA SUSP: 2.0000 | Freq: Every day | NASAL | 6 refills | Status: DC

## 2018-10-30 NOTE — Progress Notes (Signed)
Telemedicine Encounter- SOAP NOTE Established Patient  This telephone encounter was conducted with the patient's (or proxy's) verbal consent via audio telecommunications: yes  Patient was instructed to have this encounter in a suitably private space; and to only have persons present to whom they give permission to participate. In addition, patient identity was confirmed by use of name plus two identifiers (DOB and address).  I discussed the limitations, risks, security and privacy concerns of performing an evaluation and management service by telephone and the availability of in person appointments. I also discussed with the patient that there may be a patient responsible charge related to this service. The patient expressed understanding and agreed to proceed.  I spent a total of 12 minutes talking with the patient or their proxy.  No chief complaint on file.   Subjective   Nancy Smith is a 63 y.o. established patient. Telephone visit today for sore throat  HPI Symptoms include sore throat, tonsillar lymph node tenderness, ear fullness/pressure/pain, fatigue  Onset one week prior to visit, constant duration Denies agg/allev factors Has not tried treatment.  Denies fever - highest temp has been 60F. Has been able to maintain ADLs. Denies SHOB. Denies change in taste/smell. Denies GI symptoms.  Pt has history of seasonal allergies.   There are no active problems to display for this patient.   No past medical history on file.  Current Outpatient Medications  Medication Sig Dispense Refill  . buPROPion (WELLBUTRIN XL) 300 MG 24 hr tablet Take 300 mg by mouth daily.    . sertraline (ZOLOFT) 100 MG tablet Take 100 mg by mouth daily.    . traZODone (DESYREL) 100 MG tablet TK 1 T PO HS  0   No current facility-administered medications for this visit.     No Known Allergies  Social History   Socioeconomic History  . Marital status: Married    Spouse name: Not on file  .  Number of children: Not on file  . Years of education: Not on file  . Highest education level: Not on file  Occupational History  . Not on file  Social Needs  . Financial resource strain: Not on file  . Food insecurity    Worry: Not on file    Inability: Not on file  . Transportation needs    Medical: Not on file    Non-medical: Not on file  Tobacco Use  . Smoking status: Never Smoker  . Smokeless tobacco: Never Used  Substance and Sexual Activity  . Alcohol use: Not on file  . Drug use: Not on file  . Sexual activity: Not on file  Lifestyle  . Physical activity    Days per week: Not on file    Minutes per session: Not on file  . Stress: Not on file  Relationships  . Social Herbalist on phone: Not on file    Gets together: Not on file    Attends religious service: Not on file    Active member of club or organization: Not on file    Attends meetings of clubs or organizations: Not on file    Relationship status: Not on file  . Intimate partner violence    Fear of current or ex partner: Not on file    Emotionally abused: Not on file    Physically abused: Not on file    Forced sexual activity: Not on file  Other Topics Concern  . Not on file  Social History  Narrative  . Not on file    ROS Per hpi   Objective   Vitals as reported by the patient: There were no vitals filed for this visit.  Diagnoses and all orders for this visit:  Sore throat  PLAN:  Given duration of symptoms and patient's age, choosing to go ahead and give an antibiotic at this time: Augmentin 875mg  PO bid for 5 days.   Also going to provide treatment for allergies: Certrizine 10mg  PO qd and Fluticasone 1 spray each nostril bid.  Suggested pt proceed to [facility] for COVID 19 testing - if her test is negative, this will allow [place] to see her in the office for future problems.  Pt to return to clinic in 1 week if symptoms worsen or fail to improve.  Patient encouraged to  call clinic with any questions, comments, or concerns.   I discussed the assessment and treatment plan with the patient. The patient was provided an opportunity to ask questions and all were answered. The patient agreed with the plan and demonstrated an understanding of the instructions.   The patient was advised to call back or seek an in-person evaluation if the symptoms worsen or if the condition fails to improve as anticipated.  I provided 12 minutes of non-face-to-face time during this encounter.  Maximiano Coss, NP  Primary [facility] at [facility]

## 2018-10-30 NOTE — Progress Notes (Signed)
Spoke with pt about concerns and symptom she has been having x 1 week. She states she has been having sore throat with hoarseness , headache, with some ear pain on the left side of her jaw. She states she also has been very fatigue x 1 weeks with little to no energy.

## 2018-11-01 LAB — NOVEL CORONAVIRUS, NAA: SARS-CoV-2, NAA: NOT DETECTED

## 2018-11-18 NOTE — Progress Notes (Addendum)
[facility] Note  11/19/2018     CHIEF COMPLAINT Patient presents for Retina Follow Up   HISTORY OF PRESENT ILLNESS: Nancy Smith is a 63 y.o. female who presents to the clinic today for:   HPI    Retina Follow Up    Patient presents with  PVD.  In right eye.  Severity is mild.  Duration of 4 weeks.  Since onset it is stable.  I, the attending physician,  performed the HPI with the patient and updated documentation appropriately.          Comments    Patient states has a few floaters in am OD, not new; occasional flash OD. Vision the same, seems good OU.       Last edited by Bernarda Caffey, MD on 11/19/2018  9:09 AM. (History)    pt states   Referring physician: Dr. Kirstie Mirza, MD  HISTORICAL INFORMATION:   Selected notes from the MEDICAL RECORD NUMBER Referred by Dr. Kirstie Mirza for hemorrhagic PVD OD LEE: 06.01.20 Jearld Lesch) [BCVA: OD: 20/40+1 OS: 20/20-2] Ocular Hx-pterygium removal OS, cataracts, PVD OD, vitreous heme OD PMH-   CURRENT MEDICATIONS: No current outpatient medications on file. (Ophthalmic Drugs)   No current facility-administered medications for this visit.  (Ophthalmic Drugs)   Current Outpatient Medications (Other)  Medication Sig  . amoxicillin-clavulanate (AUGMENTIN) 875-125 MG tablet Take 1 tablet by mouth 2 (two) times daily.  Marland Kitchen buPROPion (WELLBUTRIN XL) 300 MG 24 hr tablet Take 300 mg by mouth daily.  . cetirizine (ZYRTEC) 10 MG tablet Take 0.5 tablets (5 mg total) by mouth daily.  . fluticasone (FLONASE) 50 MCG/ACT nasal spray Place 2 sprays into both nostrils daily.  . sertraline (ZOLOFT) 100 MG tablet Take 100 mg by mouth daily.  . traZODone (DESYREL) 100 MG tablet TK 1 T PO HS   No current facility-administered medications for this visit.  (Other)      REVIEW OF SYSTEMS: ROS    Positive for: Eyes   Negative for: Constitutional, Gastrointestinal, Neurological, Skin, Genitourinary, Musculoskeletal, HENT,  Endocrine, Cardiovascular, Respiratory, Psychiatric, Allergic/Imm, Heme/Lymph   Last edited by Roselee Nova D on 11/19/2018  8:36 AM. (History)       ALLERGIES No Known Allergies  PAST MEDICAL HISTORY History reviewed. No pertinent past medical history. History reviewed. No pertinent surgical history.  FAMILY HISTORY History reviewed. No pertinent family history.  SOCIAL HISTORY Social History   Tobacco Use  . Smoking status: Never Smoker  . Smokeless tobacco: Never Used  Substance Use Topics  . Alcohol use: Not on file  . Drug use: Not on file         OPHTHALMIC EXAM:  Base Eye Exam    Visual Acuity (Snellen - Linear)      Right Left   Dist cc 20/20 -1 20/20   Correction: Glasses       Tonometry (Tonopen, 8:41 AM)      Right Left   Pressure 17 14       Pupils      Dark Light Shape React APD   Right 3 2 Round Brisk None   Left 3 2 Round Brisk None       Visual Fields (Counting fingers)      Left Right    Full Full       Extraocular Movement      Right Left    Full, Ortho Full, Ortho       Neuro/Psych  Oriented x3: Yes   Mood/Affect: Normal       Dilation    Both eyes: 1.0% Mydriacyl, 2.5% Phenylephrine @ 8:41 AM        Slit Lamp and Fundus Exam    Slit Lamp Exam      Right Left   Lids/Lashes Dermatochalasis - upper lid, stray lashes lateral canthus upper lid Dermatochalasis - upper lid, stray lashes lateral canthus upper lid   Conjunctiva/Sclera Nasal and temporal Pinguecula Regressed pterygium nasal   Cornea 1+ Punctate epithelial erosions 1+ Punctate epithelial erosions   Anterior Chamber Deep and quiet Deep and quiet   Iris Round and dilated Round and dilated   Lens 2+ Nuclear sclerosis, 2+ Cortical cataract 2+ Nuclear sclerosis, 2+ Cortical cataract   Vitreous Vitreous syneresis, no pigment Vitreous syneresis, no pigment       Fundus Exam      Right Left   Disc Pink and Sharp Pink and Sharp   C/D Ratio 0.2 0.3   Macula Flat,  Blunted foveal reflex, Retinal pigment epithelial mottling, No heme or edema Flat, Blunted foveal reflex, Retinal pigment epithelial mottling, trace Epiretinal membrane, No heme or edema   Vessels Mild Vascular attenuation, mild Tortuous, mild AV crossing changes Mild Vascular attenuation, mild Tortuous   Periphery Attached; no RT/RD Attached; no RT/RD        Refraction    Wearing Rx      Sphere Cylinder Axis Add   Right +0.00 +0.75 010 +2.50   Left +0.00 +0.75 168 +2.50   Type: PROG          IMAGING AND PROCEDURES  Imaging and Procedures for @TODAY @  OCT, Retina - OU - Both Eyes       Right Eye Quality was good. Central Foveal Thickness: 264. Progression has been stable. Findings include normal foveal contour, no IRF, no SRF (Trace ERM, interval improvement in vitreous opacitites).   Left Eye Quality was good. Central Foveal Thickness: 274. Progression has been stable. Findings include no IRF, normal foveal contour, no SRF, vitreomacular adhesion .   Notes *Images captured and stored on drive  Diagnosis / Impression:  NFP, no IRF/SRF OU  Clinical management:  See below  Abbreviations: NFP - Normal foveal profile. CME - cystoid macular edema. PED - pigment epithelial detachment. IRF - intraretinal fluid. SRF - subretinal fluid. EZ - ellipsoid zone. ERM - epiretinal membrane. ORA - outer retinal atrophy. ORT - outer retinal tubulation. SRHM - subretinal hyper-reflective material                 ASSESSMENT/PLAN:    ICD-10-CM   1. Posterior vitreous detachment of right eye  H43.811   2. Vitreous hemorrhage of right eye ([place])  H43.11   3. Retinal edema  H35.81 OCT, Retina - OU - Both Eyes  4. Combined forms of age-related cataract of both eyes  H25.813   5. Trichiasis of eyelid of both eyes  H02.053    H02.056     1,2. Hemorrhagic PVD OD  - onset ~September 10, 2018 (saw Dr. Glennon Hamilton [place])  - VH component appears to be stably resolved on exam; pt reports tr  residual symptomatic floaters  - Discussed findings and prognosis  - No RT or RD on 360 peripheral exam  - Reviewed s/s of RT/RD  - Strict return precautions for any such RT/RD signs/symptoms  - pt is clear from a retina standpoint for regular f/u with primary eye care provider, Dr. Purcell Nails  3. No retinal edema on exam or OCT  4. Mixed form age related cataract OU  - The symptoms of cataract, surgical options, and treatments and risks were discussed with patient.  - discussed diagnosis and progression  - not yet visually significant  - monitor for now  5. Trichiasis OU  - thicker, misdirected lashes in lateral canthus OU, symptomatic rubbing and irritating lower lashes OD  - s/p forceps epilation OD 7.13.2020 -- symptoms now resolved  - monitor  Ophthalmic Meds Ordered this visit:  No orders of the defined types were placed in this encounter.      Return if symptoms worsen or fail to improve.  There are no Patient Instructions on file for this visit.   Explained the diagnoses, plan, and follow up with the patient and they expressed understanding.  Patient expressed understanding of the importance of proper follow up care.   This document serves as a record of services personally performed by Gardiner Sleeper, MD, PhD. It was created on their behalf by Ernest Mallick, OA, an ophthalmic assistant. The creation of this record is the provider's dictation and/or activities during the visit.    Electronically signed by: Ernest Mallick, OA  08.09.2020 9:42 AM    Gardiner Sleeper, M.D., Ph.D. Diseases & Surgery of the Retina and Vitreous [facility]  I have reviewed the above documentation for accuracy and completeness, and I agree with the above. Gardiner Sleeper, M.D., Ph.D. 11/19/18 9:42 AM   Abbreviations: M myopia (nearsighted); A astigmatism; H hyperopia (farsighted); P presbyopia; Mrx spectacle prescription;  CTL contact lenses; OD right eye; OS left  eye; OU both eyes  XT exotropia; ET esotropia; PEK punctate epithelial keratitis; PEE punctate epithelial erosions; DES dry eye syndrome; MGD meibomian gland dysfunction; ATs artificial tears; PFAT's preservative free artificial tears; [place] nuclear sclerotic cataract; PSC posterior subcapsular cataract; ERM epi-retinal membrane; PVD posterior vitreous detachment; RD retinal detachment; DM diabetes mellitus; DR diabetic retinopathy; NPDR non-proliferative diabetic retinopathy; PDR proliferative diabetic retinopathy; CSME clinically significant macular edema; DME diabetic macular edema; dbh dot blot hemorrhages; CWS cotton wool spot; POAG primary open angle glaucoma; C/D cup-to-disc ratio; HVF humphrey visual field; GVF goldmann visual field; OCT optical coherence tomography; IOP intraocular pressure; BRVO Branch retinal vein occlusion; CRVO central retinal vein occlusion; CRAO central retinal artery occlusion; BRAO branch retinal artery occlusion; RT retinal tear; SB scleral buckle; PPV pars plana vitrectomy; VH Vitreous hemorrhage; PRP panretinal laser photocoagulation; IVK intravitreal kenalog; VMT vitreomacular traction; MH Macular hole;  NVD neovascularization of the disc; NVE neovascularization elsewhere; AREDS age related eye disease study; ARMD age related macular degeneration; POAG primary open angle glaucoma; EBMD epithelial/anterior basement membrane dystrophy; ACIOL anterior chamber intraocular lens; IOL intraocular lens; PCIOL posterior chamber intraocular lens; Phaco/IOL phacoemulsification with intraocular lens placement; [place] photorefractive keratectomy; LASIK laser assisted in situ keratomileusis; HTN hypertension; DM diabetes mellitus; COPD chronic obstructive pulmonary disease

## 2018-11-19 ENCOUNTER — Encounter (INDEPENDENT_AMBULATORY_CARE_PROVIDER_SITE_OTHER): Payer: Self-pay | Admitting: Ophthalmology

## 2018-11-19 ENCOUNTER — Ambulatory Visit (INDEPENDENT_AMBULATORY_CARE_PROVIDER_SITE_OTHER): Payer: Medicare Other | Admitting: Ophthalmology

## 2018-11-19 ENCOUNTER — Other Ambulatory Visit: Payer: Self-pay

## 2018-11-19 DIAGNOSIS — H02053 Trichiasis without entropian right eye, unspecified eyelid: Secondary | ICD-10-CM

## 2018-11-19 DIAGNOSIS — H25813 Combined forms of age-related cataract, bilateral: Secondary | ICD-10-CM

## 2018-11-19 DIAGNOSIS — H4311 Vitreous hemorrhage, right eye: Secondary | ICD-10-CM

## 2018-11-19 DIAGNOSIS — H43811 Vitreous degeneration, right eye: Secondary | ICD-10-CM

## 2018-11-19 DIAGNOSIS — H3581 Retinal edema: Secondary | ICD-10-CM | POA: Diagnosis not present

## 2018-11-19 DIAGNOSIS — H02056 Trichiasis without entropian left eye, unspecified eyelid: Secondary | ICD-10-CM

## 2019-01-11 ENCOUNTER — Other Ambulatory Visit: Payer: Self-pay | Admitting: Internal Medicine

## 2019-01-11 DIAGNOSIS — Z780 Asymptomatic menopausal state: Secondary | ICD-10-CM

## 2019-01-11 DIAGNOSIS — Z1231 Encounter for screening mammogram for malignant neoplasm of breast: Secondary | ICD-10-CM

## 2019-02-12 ENCOUNTER — Encounter: Payer: Self-pay | Admitting: Physician Assistant

## 2019-02-12 LAB — VITAMIN B12: Vitamin B-12: 358

## 2019-02-20 ENCOUNTER — Emergency Department (HOSPITAL_COMMUNITY): Payer: Medicare Other

## 2019-02-20 ENCOUNTER — Other Ambulatory Visit: Payer: Self-pay

## 2019-02-20 ENCOUNTER — Emergency Department (HOSPITAL_COMMUNITY)
Admission: EM | Admit: 2019-02-20 | Discharge: 2019-02-20 | Disposition: A | Discharge status: Home / Self Care | Payer: Medicare Other | Attending: Emergency Medicine | Admitting: Emergency Medicine

## 2019-02-20 ENCOUNTER — Encounter (HOSPITAL_COMMUNITY): Payer: Self-pay

## 2019-02-20 DIAGNOSIS — R1084 Generalized abdominal pain: Secondary | ICD-10-CM

## 2019-02-20 DIAGNOSIS — R109 Unspecified abdominal pain: Secondary | ICD-10-CM | POA: Diagnosis present

## 2019-02-20 DIAGNOSIS — Z79899 Other long term (current) drug therapy: Secondary | ICD-10-CM | POA: Insufficient documentation

## 2019-02-20 HISTORY — DX: Noninfective gastroenteritis and colitis, unspecified: K52.9

## 2019-02-20 LAB — URINALYSIS, ROUTINE W REFLEX MICROSCOPIC
Bacteria, UA: NONE SEEN
Bilirubin Urine: NEGATIVE
Glucose, UA: NEGATIVE mg/dL
Ketones, ur: 5 mg/dL — AB
Leukocytes,Ua: NEGATIVE
Nitrite: NEGATIVE
Protein, ur: NEGATIVE mg/dL
Specific Gravity, Urine: 1.013 (ref 1.005–1.030)
pH: 6 (ref 5.0–8.0)

## 2019-02-20 LAB — COMPREHENSIVE METABOLIC PANEL
ALT: 13 U/L (ref 0–44)
AST: 17 U/L (ref 15–41)
Albumin: 4.1 g/dL (ref 3.5–5.0)
Alkaline Phosphatase: 45 U/L (ref 38–126)
Anion gap: 8 (ref 5–15)
BUN: 11 mg/dL (ref 8–23)
CO2: 24 mmol/L (ref 22–32)
Calcium: 8.9 mg/dL (ref 8.9–10.3)
Chloride: 104 mmol/L (ref 98–111)
Creatinine, Ser: 0.78 mg/dL (ref 0.44–1.00)
GFR calc Af Amer: >60 mL/min (ref >60)
GFR calc non Af Amer: >60 mL/min (ref >60)
Glucose, Bld: 88 mg/dL (ref 70–99)
Potassium: 3.7 mmol/L (ref 3.5–5.1)
Sodium: 136 mmol/L (ref 135–145)
Total Bilirubin: 0.8 mg/dL (ref 0.3–1.2)
Total Protein: 6.6 g/dL (ref 6.5–8.1)

## 2019-02-20 LAB — CBC
HCT: 38 % (ref 36.0–46.0)
Hemoglobin: 12.4 g/dL (ref 12.0–15.0)
MCH: 29.3 pg (ref 26.0–34.0)
MCHC: 32.6 g/dL (ref 30.0–36.0)
MCV: 89.8 fL (ref 80.0–100.0)
Platelets: 204 10*3/uL (ref 150–400)
RBC: 4.23 MIL/uL (ref 3.87–5.11)
RDW: 12.4 % (ref 11.5–15.5)
WBC: 6.1 10*3/uL (ref 4.0–10.5)
nRBC: 0 % (ref 0.0–0.2)

## 2019-02-20 LAB — LIPASE, BLOOD: Lipase: 29 U/L (ref 11–51)

## 2019-02-20 LAB — TSH: TSH: 1.847 u[IU]/mL (ref 0.350–4.500)

## 2019-02-20 MED ORDER — LIDOCAINE VISCOUS HCL 2 % MT SOLN
15.0000 mL | Freq: Once | OROMUCOSAL | Status: AC
  Administered 2019-02-20: 15 mL via ORAL
  Filled 2019-02-20: qty 15

## 2019-02-20 MED ORDER — SODIUM CHLORIDE 0.9% FLUSH: 3.0000 mL | Freq: Once | INTRAVENOUS | Status: DC

## 2019-02-20 MED ORDER — SODIUM CHLORIDE 0.9 % IV BOLUS
1000.0000 mL | Freq: Once | INTRAVENOUS | Status: AC
  Administered 2019-02-20: 1000 mL via INTRAVENOUS

## 2019-02-20 MED ORDER — IOHEXOL 300 MG/ML  SOLN
100.0000 mL | Freq: Once | INTRAMUSCULAR | Status: AC | PRN
  Administered 2019-02-20: 100 mL via INTRAVENOUS

## 2019-02-20 MED ORDER — HYOSCYAMINE SULFATE 0.125 MG SL SUBL
0.2500 mg | SUBLINGUAL_TABLET | Freq: Once | SUBLINGUAL | Status: AC
  Administered 2019-02-20: 15:00:00 0.25 mg via SUBLINGUAL
  Filled 2019-02-20: qty 2

## 2019-02-20 MED ORDER — ALUM & MAG HYDROXIDE-SIMETH 200-200-20 MG/5ML PO SUSP
30.0000 mL | Freq: Once | ORAL | Status: AC
  Administered 2019-02-20: 30 mL via ORAL
  Filled 2019-02-20: qty 30

## 2019-02-20 MED ORDER — SODIUM CHLORIDE (PF) 0.9 % IJ SOLN
INTRAMUSCULAR | Status: AC
  Filled 2019-02-20: qty 50

## 2019-02-20 NOTE — ED Triage Notes (Signed)
Patient c/o RUQ pain that radiates into the mid back x 1 week. Patient states she feels constipated, but had a BM yesterday. Patient also c/o nausea, but denies vomiting.

## 2019-02-20 NOTE — ED Provider Notes (Signed)
[facility] DEPT Provider Note   CSN: [id number] Arrival date & time: 02/20/19  1207     History   Chief Complaint Chief Complaint  Patient presents with  . Abdominal Pain    HPI Nancy Smith is a 63 y.o. female.     The history is provided by the patient.  Abdominal Pain Pain location:  Epigastric and RUQ Pain quality: aching, cramping, sharp and shooting   Pain radiation: radiates around the right ribs and into the back. Pain severity:  Severe Onset quality:  Sudden Duration:  1 week Timing:  Constant Progression:  Waxing and waning Chronicity:  Recurrent Context comment:  Started spontaneously but recent episode in feb of this year with similar.  at that time had normal RUQ U/S, HIDA and MRI of abd with unchanged liver cysts Relieved by:  Nothing Worsened by:  Eating and movement Ineffective treatments: tums. Associated symptoms: anorexia, constipation and nausea   Associated symptoms: no chest pain, no cough, no diarrhea, no dysuria, no fever, no hematuria and no vomiting   Risk factors comment:  Prior hx of appendectomy but not cholecystectomy   Past Medical History:  Diagnosis Date  . Colitis     There are no active problems to display for this patient.   Past Surgical History:  Procedure Laterality Date  . CHOLECYSTECTOMY       OB History   No obstetric history on file.      Home Medications    Prior to Admission medications   Medication Sig Start Date End Date Taking? Authorizing Provider  amoxicillin-clavulanate (AUGMENTIN) 875-125 MG tablet Take 1 tablet by mouth 2 (two) times daily. 10/30/18   Maximiano Coss, NP  buPROPion (WELLBUTRIN XL) 300 MG 24 hr tablet Take 300 mg by mouth daily.    [provider]  cetirizine (ZYRTEC) 10 MG tablet Take 0.5 tablets (5 mg total) by mouth daily. 10/30/18   Maximiano Coss, NP  fluticasone (FLONASE) 50 MCG/ACT nasal spray Place 2 sprays into both nostrils daily.  10/30/18   Maximiano Coss, NP  sertraline (ZOLOFT) 100 MG tablet Take 100 mg by mouth daily.    [provider]  traZODone (DESYREL) 100 MG tablet TK 1 T PO HS 10/23/17   [provider]    Family History Family History  Problem Relation Age of Onset  . Heart failure Mother   . Heart failure Father     Social History Social History   Tobacco Use  . Smoking status: Never Smoker  . Smokeless tobacco: Never Used  Substance Use Topics  . Alcohol use: Never    Frequency: Never  . Drug use: Never     Allergies   Patient has no known allergies.   Review of Systems Review of Systems  Constitutional: Negative for fever.  Respiratory: Negative for cough.   Cardiovascular: Negative for chest pain.  Gastrointestinal: Positive for abdominal pain, anorexia, constipation and nausea. Negative for diarrhea and vomiting.  Genitourinary: Negative for dysuria and hematuria.  All other systems reviewed and are negative.    Physical Exam Updated Vital Signs BP 136/79 (BP Location: Left Arm)   Pulse (!) 54   Temp 98.5 F (36.9 C) (Oral)   Resp 16   Ht 5\' 4"  (1.626 m)   Wt 52.2 kg   SpO2 98%   BMI 19.74 kg/m   Physical Exam Vitals signs and nursing note reviewed.  Constitutional:      General: She is not in acute  distress.    Appearance: She is well-developed.  HENT:     Head: Normocephalic and atraumatic.  Eyes:     Conjunctiva/sclera: Conjunctivae normal.     Pupils: Pupils are equal, round, and reactive to light.  Neck:     Musculoskeletal: Normal range of motion and neck supple.  Cardiovascular:     Rate and Rhythm: Normal rate and regular rhythm.     Heart sounds: No murmur.  Pulmonary:     Effort: Pulmonary effort is normal. No respiratory distress.     Breath sounds: Normal breath sounds. No wheezing or rales.  Abdominal:     General: Bowel sounds are normal. There is no distension.     Palpations: Abdomen is soft.     Tenderness: There is  abdominal tenderness in the right upper quadrant and epigastric area. There is guarding. There is no right CVA tenderness or rebound.  Musculoskeletal: Normal range of motion.        General: No tenderness.  Skin:    General: Skin is warm and dry.     Findings: No erythema or rash.  Neurological:     Mental Status: She is alert and oriented to person, place, and time.  Psychiatric:        Behavior: Behavior normal.      ED Treatments / Results  Labs (all labs ordered are listed, but only abnormal results are displayed) Labs Reviewed  LIPASE, BLOOD  COMPREHENSIVE METABOLIC PANEL  CBC  URINALYSIS, ROUTINE W REFLEX MICROSCOPIC  TSH    EKG None  Radiology No results found.  Procedures Procedures (including critical care time)  Medications Ordered in ED Medications  sodium chloride flush (NS) 0.9 % injection 3 mL (has no administration in time range)  alum & mag hydroxide-simeth (MAALOX/MYLANTA) 200-200-20 MG/5ML suspension 30 mL (has no administration in time range)    And  lidocaine (XYLOCAINE) 2 % viscous mouth solution 15 mL (has no administration in time range)  hyoscyamine (LEVSIN SL) SL tablet 0.25 mg (has no administration in time range)  sodium chloride 0.9 % bolus 1,000 mL (has no administration in time range)     Initial Impression / Assessment and Plan / ED Course  I have reviewed the triage vital signs and the nursing notes.  Pertinent labs & imaging results that were available during my care of the patient were reviewed by me and considered in my medical decision making (see chart for details).        63 y/o well appearing female presenting with 1 week of persistently bad epigastric and RUQ pain.  On exam pt has pain with palpation with mild guarding.  Also anorexia.  No urinary sx but some hx of decreased stools.  Pt has had HIDA, RUQ U/S, abd MRI without finding for pain in feb which pt states was similar.  Low suspicion for lung or cardiac cause.   CBC, CMP and lipase all wnl.  Will get CT as in the past pt's sx were not related gallbladder.  Pt given gi cocktail and levsin.  Also pt has hx of ruptured appy 10 years ago with similar location for pain.  Possible this is related to adhesions.   Final Clinical Impressions(s) / ED Diagnoses   Final diagnoses:  None    ED Discharge Orders    None       Blanchie Dessert, MD 02/21/19 [phone]

## 2019-02-20 NOTE — Discharge Instructions (Addendum)
The CAT scan did not show any serious problems.  It did show some enteritis findings of the small intestine, which are very nonspecific.  Sometimes this is inflammation from various causes, and other times it is something simple like a virus.  There is no sign of a bacterial infection, obstruction, or serious gastrointestinal abnormalities.  It is safe to go home and use Tylenol for pain.  Call your GI doctor for a follow-up appointment for further care and treatment.

## 2019-02-20 NOTE — ED Provider Notes (Signed)
4:50 PM-checkout from Dr. Maryan Rued to evaluate after return of CT scan.  Patient is being evaluated for recurrent abdominal pain.  CT scan showed possible enteritis, normal gallbladder and multiple hepatic cysts previously noticed on MRI imaging.  Ct Abdomen Pelvis W Contrast  Result Date: 02/20/2019 CLINICAL DATA:  Right upper quadrant pain radiating to the back for 1 week. Nausea. Cholecystitis suspected. EXAM: CT ABDOMEN AND PELVIS WITH CONTRAST TECHNIQUE: Multidetector CT imaging of the abdomen and pelvis was performed using the standard protocol following bolus administration of intravenous contrast. CONTRAST:  173mL OMNIPAQUE IOHEXOL 300 MG/ML  SOLN COMPARISON:  Right upper quadrant ultrasound 05/30/2018. Abdominal MRI 06/08/2018 FINDINGS: Lower chest: Minor hypoventilatory atelectasis in the lung bases, left greater than right. No confluent airspace disease. Pleural fluid. Hepatobiliary: Multiple cysts throughout the liver. Enhancing 19 mm lesion in the inferior right lobe of the liver corresponds to hemangioma as seen on prior MRI. Gallbladder physiologically distended, no calcified stone. No pericholecystic inflammation. No biliary dilatation. Pancreas: No ductal dilatation or inflammation. Spleen: Normal in size without focal abnormality. Adrenals/Urinary Tract: No adrenal nodule. No hydronephrosis or perinephric edema. Homogeneous renal enhancement with symmetric excretion on delayed phase imaging. 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 Additional small low-density lesion in the inferior left kidney is too small to accurately characterize. Urinary bladder is physiologically distended without wall thickening. Stomach/Bowel: Bowel evaluation is suboptimal in the absence of enteric contrast and paucity of intra-abdominal fat. The stomach is decompressed. Possible but not definite areas of wall thickening involving the proximal small bowel loops. No definite  mesenteric edema or mesenteric engorgement. No bowel obstruction. Small volume of stool throughout the colon. No colonic wall thickening or inflammatory change. Post appendectomy with surgical staple at the base of the cecum. Vascular/Lymphatic: Mild aorta bi-iliac atherosclerosis. No aneurysm. Portal vein is patent. No enlarged lymph nodes in the abdomen or pelvis. Reproductive: Retroverted uterus with small calcified fibroids in the fundus. No suspicious adnexal mass. Other: No free air, free fluid, or intra-abdominal fluid collection. Musculoskeletal: There are no acute or suspicious osseous abnormalities. Scattered degenerative change in the spine. IMPRESSION: 1. Possible but not definite areas of wall thickening involving the proximal small bowel loops, can be seen with enteritis. No bowel obstruction. 2. Normal CT appearance of the gallbladder. 3. Multiple hepatic cysts as well as right lobe hemangioma, previously characterized by MRI. Aortic Atherosclerosis (ICD10-I70.0). Electronically Signed   By: Keith Rake M.D.   On: 02/20/2019 16:43     Patient Vitals for the past 24 hrs:  BP Temp Temp src Pulse Resp SpO2 Height Weight  02/20/19 1740 135/80 - - (!) 57 16 100 % - -  02/20/19 1229 - - - - - - 5\' 4"  (1.626 m) 52.2 kg  02/20/19 1225 136/79 98.5 F (36.9 C) Oral (!) 54 16 98 % - -    6:15 PM Reevaluation with update and discussion. After initial assessment and treatment, an updated evaluation reveals after treatment with Maalox and Levsin, she states that she had no relief with these medications.  She states that she has tried multiple medications in the past without relief.  She describes having comprehensive evaluation including endoscopy, by her GI doctor without findings to solve her problem.  She states she has chronic constipation and has tried multiple medications but does not currently take anything for it.  She is not interested in trying any other medicine at this time, and is very  frustrated  about knowing what is wrong.  We discussed all the findings today and the lack of a diagnostic entity.  I explained it would be best to follow-up with her GI doctor in the meantime treat her symptoms with symptomatic care using over-the-counter medications.  Patient was agreeable and I answered all of her questions.Daleen Bo   Medical Decision Making: Chronic abdominal pain, with various symptoms.  Reassuring ED evaluation.  She does not require hospitalization at this time.  CRITICAL CARE-no Performed by: Daleen Bo   Nursing Notes Reviewed/ Care Coordinated Applicable Imaging Reviewed Interpretation of Laboratory Data incorporated into ED treatment  The patient appears reasonably screened and/or stabilized for discharge and I doubt any other medical condition or other [facility] requiring further screening, evaluation, or treatment in the ED at this time prior to discharge.  Plan: Home Medications-continue current use Tylenol for pain; Home Treatments-gradual advance diet; return here if the recommended treatment, does not improve the symptoms; Recommended follow up-PCP, as needed    Daleen Bo, MD 02/20/19 [phone]

## 2019-03-12 ENCOUNTER — Ambulatory Visit: Payer: Self-pay

## 2019-03-12 ENCOUNTER — Other Ambulatory Visit: Payer: Self-pay

## 2019-05-07 ENCOUNTER — Ambulatory Visit
Admission: RE | Admit: 2019-05-07 | Discharge: 2019-05-07 | Disposition: A | Discharge status: Home / Self Care | Payer: Medicare PPO | Source: Ambulatory Visit | Attending: Internal Medicine | Admitting: Internal Medicine

## 2019-05-07 ENCOUNTER — Other Ambulatory Visit: Payer: Self-pay

## 2019-05-07 DIAGNOSIS — Z780 Asymptomatic menopausal state: Secondary | ICD-10-CM

## 2019-05-07 DIAGNOSIS — Z1231 Encounter for screening mammogram for malignant neoplasm of breast: Secondary | ICD-10-CM

## 2019-05-07 LAB — HM MAMMOGRAPHY

## 2019-05-07 LAB — HM DEXA SCAN

## 2019-05-28 ENCOUNTER — Telehealth: Payer: Self-pay

## 2019-05-28 NOTE — Telephone Encounter (Signed)
Unfortunately not able to accept at this time due to volume

## 2019-05-28 NOTE — Telephone Encounter (Signed)
Patient called in stating that she is wanting to establish care with Dr. Birdie Riddle. States she is a friend of Sam Frederick Peers, who referred her to Dr. Birdie Riddle. Informed patient that Dr. Birdie Riddle is not currently accepting new patients and that I have to get prior approval to schedule any NP appts. Please advise

## 2019-06-02 ENCOUNTER — Ambulatory Visit: Payer: Medicare PPO | Attending: Internal Medicine

## 2019-06-02 DIAGNOSIS — Z23 Encounter for immunization: Secondary | ICD-10-CM

## 2019-06-02 NOTE — Progress Notes (Signed)
   Covid-19 Vaccination Clinic  Name:  Nancy Smith    MRN: [id number] DOB: June 05, 1955  06/02/2019  [name] was observed post Covid-19 immunization for 15 minutes without incidence. She was provided with Vaccine Information Sheet and instruction to access the V-Safe system.   [name] was instructed to call 911 with any severe reactions post vaccine: Marland Kitchen Difficulty breathing  . Swelling of your face and throat  . A fast heartbeat  . A bad rash all over your body  . Dizziness and weakness    Immunizations Administered    Name Date Dose VIS Date Route   Pfizer COVID-19 Vaccine 06/02/2019  9:35 AM 0.3 mL 03/22/2019 Intramuscular   Manufacturer: [place]   Lot: [id number]   [place]: [id number]

## 2019-06-03 NOTE — Telephone Encounter (Signed)
Pt has been made aware of this and she will est care with [facility]

## 2019-06-26 ENCOUNTER — Ambulatory Visit: Payer: Medicare PPO | Attending: Internal Medicine

## 2019-06-26 DIAGNOSIS — Z23 Encounter for immunization: Secondary | ICD-10-CM

## 2019-06-26 NOTE — Progress Notes (Signed)
   Covid-19 Vaccination Clinic  Name:  Nancy Smith    MRN: [id number] DOB: October 29, 1955  06/26/2019  [name] was observed post Covid-19 immunization for 15 minutes without incident. She was provided with Vaccine Information Sheet and instruction to access the V-Safe system.   [name] was instructed to call 911 with any severe reactions post vaccine: Marland Kitchen Difficulty breathing  . Swelling of face and throat  . A fast heartbeat  . A bad rash all over body  . Dizziness and weakness   Immunizations Administered    Name Date Dose VIS Date Route   Pfizer COVID-19 Vaccine 06/26/2019  8:25 AM 0.3 mL 03/22/2019 Intramuscular   Manufacturer: [place]   Lot: [id number]   [place]: [id number]

## 2019-08-23 ENCOUNTER — Encounter: Payer: Self-pay | Admitting: Physician Assistant

## 2019-08-23 ENCOUNTER — Ambulatory Visit (INDEPENDENT_AMBULATORY_CARE_PROVIDER_SITE_OTHER): Payer: Medicare PPO | Admitting: Physician Assistant

## 2019-08-23 ENCOUNTER — Other Ambulatory Visit: Payer: Self-pay

## 2019-08-23 VITALS — BP 122/60 | HR 64 | Temp 99.0°F | Resp 16 | Ht 65.5 in | Wt 116.8 lb

## 2019-08-23 DIAGNOSIS — H1013 Acute atopic conjunctivitis, bilateral: Secondary | ICD-10-CM | POA: Diagnosis not present

## 2019-08-23 DIAGNOSIS — T7840XA Allergy, unspecified, initial encounter: Secondary | ICD-10-CM | POA: Diagnosis not present

## 2019-08-23 DIAGNOSIS — F339 Major depressive disorder, recurrent, unspecified: Secondary | ICD-10-CM | POA: Diagnosis not present

## 2019-08-23 MED ORDER — AZELASTINE HCL 0.05 % OP SOLN: 1.0000 [drp] | Freq: Two times a day (BID) | OPHTHALMIC | 12 refills | Status: DC

## 2019-08-23 MED ORDER — BUPROPION HCL ER (XL) 150 MG PO TB24: 150.0000 mg | ORAL_TABLET | Freq: Every day | ORAL | 2 refills | Status: DC

## 2019-08-23 NOTE — Progress Notes (Signed)
Patient presents to clinic today to establish care.  Acute Concerns: Patient endorses a few weeks of intermittent episodes of redness, itching and swelling around her eyes bilaterally. Notes some increased watering of eyes without purulent drainage. Denies fever, chills. Denies any new soaps, lotions or detergents. Denies fever, chills. Denies vision changes. Notes skin is very dry. Denies history of eczema or asthma. Denies any other areas of skin changes of face or body.   Chronic Issues: Depression -- long-standing history. Currently on a regimen of Sertraline 100 mg QD, Wellbutrin XL 300 mg QD and Trazodone 50 mg QHS. Notes taking medications as directed. States since her dose of Wellbutrin was increased she has noted some anxiety with racing thoughts and inability to focus. Would like to discuss options for treatment. Denies SI/HI.   Health Maintenance: Colonoscopy -- UTD Mammogram -- UTD PAP --  Patient endorses up-to-date. Will obtain records.    Past Medical History:  Diagnosis Date  . Colitis     Past Surgical History:  Procedure Laterality Date  . APPENDECTOMY      Current Outpatient Medications on File Prior to Visit  Medication Sig Dispense Refill  . ALPRAZolam (XANAX) 0.5 MG tablet Take 0.5 mg by mouth daily as needed for anxiety.    Marland Kitchen buPROPion (WELLBUTRIN XL) 300 MG 24 hr tablet Take 300 mg by mouth daily.    . fluticasone (FLONASE) 50 MCG/ACT nasal spray Place 2 sprays into both nostrils daily. (Patient taking differently: Place 2 sprays into both nostrils daily as needed for allergies. ) 16 g 6  . omeprazole (PRILOSEC) 20 MG capsule     . sertraline (ZOLOFT) 100 MG tablet Take 100 mg by mouth daily.    . traZODone (DESYREL) 100 MG tablet Take 50 mg by mouth at bedtime.   0  . amoxicillin-clavulanate (AUGMENTIN) 875-125 MG tablet Take 1 tablet by mouth 2 (two) times daily. (Patient not taking: Reported on 02/20/2019) 10 tablet 0  . cetirizine (ZYRTEC) 10 MG tablet  Take 0.5 tablets (5 mg total) by mouth daily. (Patient not taking: Reported on 02/20/2019) 30 tablet 11   No current facility-administered medications on file prior to visit.    Allergies  Allergen Reactions  . Latex     Family History  Problem Relation Age of Onset  . Heart failure Mother   . Cancer Mother   . Heart failure Father   . Congestive Heart Failure Brother     Social History   Socioeconomic History  . Marital status: Married    Spouse name: Not on file  . Number of children: Not on file  . Years of education: Not on file  . Highest education level: Not on file  Occupational History  . Not on file  Tobacco Use  . Smoking status: Never Smoker  . Smokeless tobacco: Never Used  Substance and Sexual Activity  . Alcohol use: Never  . Drug use: Never  . Sexual activity: Not on file  Other Topics Concern  . Not on file  Social History Narrative  . Not on file   Social Determinants of Health   Financial Resource Strain:   . Difficulty of Paying Living Expenses:   Food Insecurity:   . Worried About Charity fundraiser in the Last Year:   . Arboriculturist in the Last Year:   Transportation Needs:   . Film/video editor (Medical):   Marland Kitchen Lack of Transportation (Non-Medical):   Physical Activity:   .  Days of Exercise per Week:   . Minutes of Exercise per Session:   Stress:   . Feeling of Stress :   Social Connections:   . Frequency of Communication with Friends and Family:   . Frequency of Social Gatherings with Friends and Family:   . Attends Religious Services:   . Active Member of Clubs or Organizations:   . Attends Archivist Meetings:   Marland Kitchen Marital Status:   Intimate Partner Violence:   . Fear of Current or Ex-Partner:   . Emotionally Abused:   Marland Kitchen Physically Abused:   . Sexually Abused:     ROS  Pertinent ROS are listed in the HPI.  BP 122/60   Pulse 64   Temp 99 F (37.2 C) (Temporal)   Resp 16   Ht 5' 5.5" (1.664 m)   Wt 116  lb 12.8 oz (53 kg)   SpO2 98%   BMI 19.14 kg/m   Physical Exam Vitals reviewed.  Constitutional:      Appearance: Normal appearance.  HENT:     Head: Normocephalic and atraumatic.     Right Ear: Tympanic membrane normal.     Left Ear: Tympanic membrane normal.  Eyes:     General: Lids are normal.        Right eye: No foreign body, discharge or hordeolum.        Left eye: No foreign body, discharge or hordeolum.     Extraocular Movements: Extraocular movements intact.     Conjunctiva/sclera:     Right eye: Right conjunctiva is injected. No chemosis or hemorrhage.    Left eye: Left conjunctiva is injected. No chemosis or hemorrhage.    Pupils: Pupils are equal, round, and reactive to light.     Comments: Skin of orbital regions bilaterally slightly erythematous with soft tissue swelling (mild) and xerosis. Some flaking noted.   Cardiovascular:     Rate and Rhythm: Normal rate and regular rhythm.     Pulses: Normal pulses.     Heart sounds: Normal heart sounds.  Pulmonary:     Effort: Pulmonary effort is normal.     Breath sounds: Normal breath sounds.  Musculoskeletal:     Cervical back: Neck supple. No tenderness.  Lymphadenopathy:     Cervical: No cervical adenopathy.  Neurological:     General: No focal deficit present.     Mental Status: She is alert and oriented to person, place, and time.  Psychiatric:        Mood and Affect: Mood normal.     Assessment/Plan: 1. Allergic reaction, initial encounter Recurring. Intermittent symptoms without known trigger. She has stopped all makeup use and denies change to products. No more systemic symptoms presenting thankfully. Rx Optivar to help with allergic inflammation. Benadryl and cold compresses as directed. Referral to allergy for further evaluation placed.  - azelastine (OPTIVAR) 0.05 % ophthalmic solution; Place 1 drop into both eyes 2 (two) times daily.  Dispense: 6 mL; Refill: 12  2. Allergic conjunctivitis of both  eyes Supportive measures reviewed. Rx Optivar as directed. Follow-up if not improving.  - azelastine (OPTIVAR) 0.05 % ophthalmic solution; Place 1 drop into both eyes 2 (two) times daily.  Dispense: 6 mL; Refill: 12  3. Depression, recurrent ([place]) Discussed options. Will decrease Wellbutrin back to 150 mg to see if she notes improvement in racing thoughts as these symptoms seemed to coincide with the increase in dosage by her previous provider. Also discussed adjusting other medications. She notes  she just wants to get things taken care of as she feels she has just continued to struggle. Reviewed Gene Sight testing which she would like to proceed with. Swab obtained today. Will follow-up when results are available and make further changes.  - buPROPion (WELLBUTRIN XL) 150 MG 24 hr tablet; Take 1 tablet (150 mg total) by mouth daily.  Dispense: 30 tablet; Refill: 2  This visit occurred during the SARS-CoV-2 public health emergency.  Safety protocols were in place, including screening questions prior to the visit, additional usage of staff PPE, and extensive cleaning of exam room while observing appropriate contact time as indicated for disinfecting solutions.     Leeanne Rio, PA-C

## 2019-08-23 NOTE — Patient Instructions (Signed)
Please continue chronic medications with the following exception: -- cut back to new dose of Bupropion XL (150 mg daily) I will call you as soon as we have the Gene Sight testing results.  For the eyes, you will be contacted to schedule an appointment with Allergy.  Please use the Astelin drops as directed. Also start a Claritin daily.  Avoid applying makeup to the eyes. Insurance will not pay for the Eucrisa I wanted to give you. As such please get OTC cortisone cream to apply to the skin around the eyes once daily for the next week.  Any acute worsening of symptoms or association with rash or difficulty breathing, you need ER evaluation.  It was very nice meeting you today. Welcome to [facility]!

## 2019-09-01 ENCOUNTER — Telehealth: Payer: Self-pay | Admitting: Physician Assistant

## 2019-09-01 NOTE — Telephone Encounter (Signed)
Received GeneSite results for patient. Would like to schedule a video visit to discuss results and next steps at patient's earliest convenience.

## 2019-09-02 ENCOUNTER — Encounter: Payer: Self-pay | Admitting: Physician Assistant

## 2019-09-02 ENCOUNTER — Telehealth (INDEPENDENT_AMBULATORY_CARE_PROVIDER_SITE_OTHER): Payer: Medicare PPO | Admitting: Physician Assistant

## 2019-09-02 ENCOUNTER — Other Ambulatory Visit: Payer: Self-pay

## 2019-09-02 DIAGNOSIS — K219 Gastro-esophageal reflux disease without esophagitis: Secondary | ICD-10-CM | POA: Insufficient documentation

## 2019-09-02 DIAGNOSIS — F419 Anxiety disorder, unspecified: Secondary | ICD-10-CM | POA: Insufficient documentation

## 2019-09-02 DIAGNOSIS — F339 Major depressive disorder, recurrent, unspecified: Secondary | ICD-10-CM

## 2019-09-02 MED ORDER — NORTRIPTYLINE HCL 10 MG PO CAPS: 10.0000 mg | ORAL_CAPSULE | Freq: Every day | ORAL | 1 refills | Status: DC

## 2019-09-02 MED ORDER — SERTRALINE HCL 25 MG PO TABS: 25.0000 mg | ORAL_TABLET | ORAL | 0 refills | Status: DC

## 2019-09-02 NOTE — Telephone Encounter (Signed)
Pt is scheduled for  An appt.

## 2019-09-02 NOTE — Patient Instructions (Signed)
Please continue the Wellbutrin at current dose. We are going to wean you off of the trazodone and Zoloft while we start another medication at low dose.  For the trazodone, take half a tablet every other night for 1 week. Then decrease to the new tablet (50 mg), taking half a tablet (25 mg) every other night for 1 week before stopping.  For the sertraline, again half a tablet (50 mg) daily for 2 weeks. Then take half tablet (50 mg) every other day for 1 week before stopping.  At the same time we will start the Pamelor 10 mg once each evening.  We will continue this dose for the next 2 weeks as you continue to wean off of other medications, increasing at that point.  If it anytime you notice any significant worsening of symptoms or any side effect to medication, please notify us immediately.

## 2019-09-02 NOTE — Progress Notes (Signed)
Virtual Visit via Video   I connected with patient on 09/02/19 at  2:00 PM EDT by a video enabled telemedicine application and verified that I am speaking with the correct person using two identifiers.  Location patient: Home Location provider: [facility] Office Persons participating in the virtual visit: Patient, Provider, [place] (Patina Moore)  I discussed the limitations of evaluation and management by telemedicine and the availability of in person appointments. The patient expressed understanding and agreed to proceed.  Subjective:   HPI:   Patient presents via [place] today to discuss her GeneSight testing report.  Patient with history of depression and anxiety.  Was having issue filling scattered.  As such her Wellbutrin was decreased from 300 mg to 150 daily.  She endorses taking as directed along with her Zoloft.  Tolerating change well overall.  Has had a mild headache today but unsure if this is related.  Is glad that her results are and so we can discuss and make further changes.  Denies new symptoms since last assessment..  ROS:   See pertinent positives and negatives per HPI.  Patient Active Problem List   Diagnosis Date Noted  . Depressive disorder 09/02/2019  . Gastroesophageal reflux disease 09/02/2019    Social History   Tobacco Use  . Smoking status: Never Smoker  . Smokeless tobacco: Never Used  Substance Use Topics  . Alcohol use: Never    Current Outpatient Medications:  .  ALPRAZolam (XANAX) 0.5 MG tablet, Take 0.5 mg by mouth 2 (two) times daily as needed for anxiety. , Disp: , Rfl:  .  azelastine (OPTIVAR) 0.05 % ophthalmic solution, Place 1 drop into both eyes 2 (two) times daily., Disp: 6 mL, Rfl: 12 .  buPROPion (WELLBUTRIN XL) 150 MG 24 hr tablet, Take 1 tablet (150 mg total) by mouth daily., Disp: 30 tablet, Rfl: 2 .  sertraline (ZOLOFT) 100 MG tablet, Take 100 mg by mouth daily., Disp: , Rfl:  .  traZODone (DESYREL) 100 MG tablet,  Take 50 mg by mouth at bedtime. , Disp: , Rfl: 0  Allergies  Allergen Reactions  . Latex     Objective:   There were no vitals taken for this visit.  Patient is well-developed, well-nourished in no acute distress.  Resting comfortably at home.  Head is normocephalic, atraumatic.  No labored breathing.  Speech is clear and coherent with logical content.  Patient is alert and oriented at baseline.   Assessment and Plan:   1. Depression, recurrent ([place]) Reviewed GeneSight report with patient.  Current regimen of Zoloft, Wellbutrin and trazodone are subtherapeutic.  Concern Wellbutrin was contributing to her scattered notes that she noticed this with increase in dose by her previous care provider.  All of her current medications are in the "yellow category" meaning that there is moderate gene to drug interactions present.  Discussed options for changing treatment.  Since she feels the trazodone does not help with sleep either we will have her continue current dose of bupropion daily, have her wean down off of the trazodone and sertraline over the next 3 weeks, while starting a low-dose of Pamelor once nightly (green category) in hopes that this will help with both mood and sleep.  Close follow-up to be scheduled within 3 to 4 weeks.  She will follow-up once weekly via MyChart or phone as well so we can keep a close eye on things.  Strict return precautions/ER precautions reviewed with patient.  Patient voiced understanding and agreement with  plan.  Medication sent to pharmacy.    Leeanne Rio, [place] 09/02/2019

## 2019-09-02 NOTE — Progress Notes (Signed)
I have discussed the procedure for the virtual visit with the patient who has given consent to proceed with assessment and treatment.   Patina S Moore, CMA     

## 2019-09-03 ENCOUNTER — Encounter: Payer: Self-pay | Admitting: Emergency Medicine

## 2019-09-09 ENCOUNTER — Encounter: Payer: Self-pay | Admitting: Physician Assistant

## 2019-09-09 DIAGNOSIS — H259 Unspecified age-related cataract: Secondary | ICD-10-CM | POA: Insufficient documentation

## 2019-09-09 DIAGNOSIS — E7800 Pure hypercholesterolemia, unspecified: Secondary | ICD-10-CM | POA: Insufficient documentation

## 2019-09-09 DIAGNOSIS — Z8669 Personal history of other diseases of the nervous system and sense organs: Secondary | ICD-10-CM | POA: Insufficient documentation

## 2019-09-09 DIAGNOSIS — K7689 Other specified diseases of liver: Secondary | ICD-10-CM | POA: Insufficient documentation

## 2019-09-09 DIAGNOSIS — Z85828 Personal history of other malignant neoplasm of skin: Secondary | ICD-10-CM | POA: Insufficient documentation

## 2019-09-09 DIAGNOSIS — D1803 Hemangioma of intra-abdominal structures: Secondary | ICD-10-CM | POA: Insufficient documentation

## 2019-09-09 DIAGNOSIS — M8580 Other specified disorders of bone density and structure, unspecified site: Secondary | ICD-10-CM | POA: Insufficient documentation

## 2019-09-09 DIAGNOSIS — E785 Hyperlipidemia, unspecified: Secondary | ICD-10-CM | POA: Insufficient documentation

## 2019-09-09 DIAGNOSIS — Z86010 Personal history of colonic polyps: Secondary | ICD-10-CM | POA: Insufficient documentation

## 2019-09-09 DIAGNOSIS — Z85820 Personal history of malignant melanoma of skin: Secondary | ICD-10-CM | POA: Insufficient documentation

## 2019-09-09 DIAGNOSIS — D1771 Benign lipomatous neoplasm of kidney: Secondary | ICD-10-CM | POA: Insufficient documentation

## 2019-09-09 DIAGNOSIS — R922 Inconclusive mammogram: Secondary | ICD-10-CM | POA: Insufficient documentation

## 2019-09-11 ENCOUNTER — Ambulatory Visit: Payer: Medicare PPO | Admitting: Physician Assistant

## 2019-09-12 ENCOUNTER — Other Ambulatory Visit: Payer: Self-pay | Admitting: Physician Assistant

## 2019-09-12 ENCOUNTER — Encounter: Payer: Self-pay | Admitting: Physician Assistant

## 2019-09-12 MED ORDER — TRAZODONE HCL 50 MG PO TABS
25.0000 mg | ORAL_TABLET | Freq: Every evening | ORAL | 0 refills | Status: DC | PRN
Start: 2019-09-12 — End: 2020-01-14

## 2019-09-18 ENCOUNTER — Encounter: Payer: Self-pay | Admitting: Physician Assistant

## 2019-09-18 ENCOUNTER — Ambulatory Visit: Payer: Medicare PPO | Admitting: Physician Assistant

## 2019-09-18 ENCOUNTER — Other Ambulatory Visit: Payer: Self-pay

## 2019-09-18 VITALS — BP 116/72 | HR 60 | Temp 98.2°F | Ht 65.5 in | Wt 117.0 lb

## 2019-09-18 DIAGNOSIS — F419 Anxiety disorder, unspecified: Secondary | ICD-10-CM

## 2019-09-18 DIAGNOSIS — F329 Major depressive disorder, single episode, unspecified: Secondary | ICD-10-CM | POA: Diagnosis not present

## 2019-09-18 NOTE — Patient Instructions (Signed)
So for now: (1) Keep taking the 150 mg dose of Bupropion (Wellbutrin) (2) Continue Trazodone (3) Starting tomorrow, resume the 100 mg dose of Sertraline  No more Pamelor.   Let's monitor things over the next 3-4 weeks and do another follow-up at that time. Can be video if you prefer.  Hang in there!

## 2019-09-18 NOTE — Progress Notes (Signed)
Patient presents to clinic today to discuss recent adjustments in her antidepressant regimen. Patient recently with GeneSight testing showing most of the medications she was taking were in the "yellow" category -- meaning there were single gene-drug interactions present. She was on a regimen of Wellbutrin XL 300 mg, Trazodone 100 mg, Sertraline 100 mg daily. While awaiting results we had decreased her Wellbutrin XL to 150 mg daily as it seemed the high dose was causing her issue with agitation and felling "all over the place". After reviewing GeneSight report and options she had elected to switch her SSRI and Trazodone to a regimen of Pamelor to help with both mood and sleep. Very slow cross-taper was started at that time. Notes having issue coming down off of the sertraline. Feels that mood has worsened. As such she stopped her Pamelor and titrated back up to 100 mg of Sertraline. Has restarted Trazodone and continued lower dose of Wellbutrin stating she has felt much better than she has in a very long time. Feels she would like to stick with just the reduced of Wellbutrin for now without further changes.   Past Medical History:  Diagnosis Date  . Colitis    Lymphocytic colitis; had 2 flares in her lifetime  . Hx of appendicitis     Current Outpatient Medications on File Prior to Visit  Medication Sig Dispense Refill  . ALPRAZolam (XANAX) 0.5 MG tablet Take 0.5 mg by mouth 2 (two) times daily as needed for anxiety.     Marland Kitchen azelastine (OPTIVAR) 0.05 % ophthalmic solution Place 1 drop into both eyes 2 (two) times daily. 6 mL 12  . buPROPion (WELLBUTRIN XL) 150 MG 24 hr tablet Take 1 tablet (150 mg total) by mouth daily. 30 tablet 2  . nortriptyline (PAMELOR) 10 MG capsule Take 1 capsule (10 mg total) by mouth at bedtime. 30 capsule 1  . sertraline (ZOLOFT) 25 MG tablet Take 1 tablet (25 mg total) by mouth every other day. For 1 week before stopping. 4 tablet 0  . traZODone (DESYREL) 50 MG tablet  Take 0.5 tablets (25 mg total) by mouth at bedtime as needed for sleep. 5 tablet 0   No current facility-administered medications on file prior to visit.    Allergies  Allergen Reactions  . Latex   . Strattera [Atomoxetine] Palpitations    Family History  Problem Relation Age of Onset  . Heart failure Mother   . Lung cancer Mother        Smoker  . CAD Mother        s/p bypass  . Osteoporosis Mother   . Heart failure Father   . Hyperlipidemia Father   . Heart disease Father   . Atrial fibrillation Father   . Congestive Heart Failure Brother     Social History   Socioeconomic History  . Marital status: Married    Spouse name: Not on file  . Number of children: Not on file  . Years of education: Not on file  . Highest education level: Not on file  Occupational History  . Not on file  Tobacco Use  . Smoking status: Never Smoker  . Smokeless tobacco: Never Used  Substance and Sexual Activity  . Alcohol use: Never  . Drug use: Never  . Sexual activity: Not on file  Other Topics Concern  . Not on file  Social History Narrative  . Not on file   Social Determinants of Health   Financial Resource Strain:   .  Difficulty of Paying Living Expenses:   Food Insecurity:   . Worried About Charity fundraiser in the Last Year:   . Arboriculturist in the Last Year:   Transportation Needs:   . Film/video editor (Medical):   Marland Kitchen Lack of Transportation (Non-Medical):   Physical Activity:   . Days of Exercise per Week:   . Minutes of Exercise per Session:   Stress:   . Feeling of Stress :   Social Connections:   . Frequency of Communication with Friends and Family:   . Frequency of Social Gatherings with Friends and Family:   . Attends Religious Services:   . Active Member of Clubs or Organizations:   . Attends Archivist Meetings:   Marland Kitchen Marital Status:     Review of Systems - See HPI.  All other ROS are negative.  BP 116/72   Pulse 60   Temp 98.2 F  (36.8 C)   Ht 5' 5.5" (1.664 m)   Wt 117 lb (53.1 kg)   SpO2 98%   BMI 19.17 kg/m   Physical Exam Vitals reviewed.  Constitutional:      Appearance: Normal appearance.  HENT:     Head: Normocephalic and atraumatic.  Eyes:     Conjunctiva/sclera: Conjunctivae normal.  Cardiovascular:     Rate and Rhythm: Normal rate and regular rhythm.     Pulses: Normal pulses.     Heart sounds: Normal heart sounds.  Musculoskeletal:     Cervical back: Neck supple.  Neurological:     Mental Status: She is alert.  Psychiatric:        Mood and Affect: Mood normal.     Assessment/Plan: 1. Anxiety and depression Will continue Sertraline and Trazodone at prior doses. Will continued reduced dose of Wellbutrin XL at 150 mg daily for the next 4-6 weeks. Will reassess at that time.   This visit occurred during the SARS-CoV-2 public health emergency.  Safety protocols were in place, including screening questions prior to the visit, additional usage of staff PPE, and extensive cleaning of exam room while observing appropriate contact time as indicated for disinfecting solutions.     Leeanne Rio, PA-C

## 2019-11-14 ENCOUNTER — Other Ambulatory Visit: Payer: Self-pay | Admitting: Emergency Medicine

## 2019-11-14 DIAGNOSIS — F339 Major depressive disorder, recurrent, unspecified: Secondary | ICD-10-CM

## 2019-11-14 MED ORDER — BUPROPION HCL ER (XL) 150 MG PO TB24: 150.0000 mg | ORAL_TABLET | Freq: Every day | ORAL | 2 refills | Status: DC

## 2019-12-09 ENCOUNTER — Telehealth: Payer: Self-pay | Admitting: Physician Assistant

## 2019-12-09 NOTE — Telephone Encounter (Signed)
Spoke with patient could not sched for tomorrow she was out of town and will call back Wednesday.

## 2019-12-18 ENCOUNTER — Other Ambulatory Visit: Payer: Self-pay

## 2019-12-18 ENCOUNTER — Ambulatory Visit: Payer: Medicare PPO | Admitting: Physician Assistant

## 2019-12-18 ENCOUNTER — Encounter: Payer: Self-pay | Admitting: Physician Assistant

## 2019-12-18 VITALS — BP 122/80 | HR 69 | Temp 98.0°F | Resp 14 | Ht 65.5 in | Wt 119.0 lb

## 2019-12-18 DIAGNOSIS — R5382 Chronic fatigue, unspecified: Secondary | ICD-10-CM | POA: Diagnosis not present

## 2019-12-18 DIAGNOSIS — M26653 Arthropathy of bilateral temporomandibular joint: Secondary | ICD-10-CM

## 2019-12-18 DIAGNOSIS — F419 Anxiety disorder, unspecified: Secondary | ICD-10-CM | POA: Diagnosis not present

## 2019-12-18 DIAGNOSIS — K219 Gastro-esophageal reflux disease without esophagitis: Secondary | ICD-10-CM | POA: Diagnosis not present

## 2019-12-18 DIAGNOSIS — F32A Depression, unspecified: Secondary | ICD-10-CM

## 2019-12-18 DIAGNOSIS — F329 Major depressive disorder, single episode, unspecified: Secondary | ICD-10-CM

## 2019-12-18 LAB — CBC WITH DIFFERENTIAL/PLATELET
Basophils Absolute: 0 10*3/uL (ref 0.0–0.1)
Basophils Relative: 0.6 % (ref 0.0–3.0)
Eosinophils Absolute: 0.1 10*3/uL (ref 0.0–0.7)
Eosinophils Relative: 2.3 % (ref 0.0–5.0)
HCT: 38.5 % (ref 36.0–46.0)
Hemoglobin: 12.8 g/dL (ref 12.0–15.0)
Lymphocytes Relative: 23.4 % (ref 12.0–46.0)
Lymphs Abs: 1.4 10*3/uL (ref 0.7–4.0)
MCHC: 33.2 g/dL (ref 30.0–36.0)
MCV: 89.8 fl (ref 78.0–100.0)
Monocytes Absolute: 0.5 10*3/uL (ref 0.1–1.0)
Monocytes Relative: 8.1 % (ref 3.0–12.0)
Neutro Abs: 3.9 10*3/uL (ref 1.4–7.7)
Neutrophils Relative %: 65.6 % (ref 43.0–77.0)
Platelets: 252 10*3/uL (ref 150.0–400.0)
RBC: 4.29 Mil/uL (ref 3.87–5.11)
RDW: 13.2 % (ref 11.5–15.5)
WBC: 5.9 10*3/uL (ref 4.0–10.5)

## 2019-12-18 LAB — TSH: TSH: 1.49 u[IU]/mL (ref 0.35–4.50)

## 2019-12-18 LAB — VITAMIN B12: Vitamin B-12: 804 pg/mL (ref 211–911)

## 2019-12-18 LAB — VITAMIN D 25 HYDROXY (VIT D DEFICIENCY, FRACTURES): VITD: 46.92 ng/mL (ref 30.00–100.00)

## 2019-12-18 MED ORDER — ALPRAZOLAM 0.5 MG PO TABS: 0.5000 mg | ORAL_TABLET | Freq: Two times a day (BID) | ORAL | 1 refills | Status: DC | PRN

## 2019-12-18 NOTE — Progress Notes (Signed)
Patient presents to clinic today for follow-up of depression and anxiety. Patient also with other ongoing issues she would like to discuss.   In regards to anxiety and depression, patient is currently on a combination of Sertraline and Wellbutrin XL. Also taking Xanax PRN. Notes great control over symptoms with current regimen. Sleeping well without daytime somnolence.   Patient has noted headache (R temporal), R ear pressure, ringing in the ear intermittently over the past several months. Denies pain with chewing or moving her jaw. Notes history of jaw popping with chewing however with diagnosis of TMJ. Denies nasal congestion and sinus pressure. Does have seasonal rhinitis with PRN use of Flonase.   Patient with chronic cough previously diagnosed as laryngopharyngeal reflux by ENT. Is on a regimen of Omeprazole 20 mg daily. Endorses taking most days. Is noting breakthrough symptoms with cough and globus mainly at night and early morning as well as as after drinking coffee. Has not follow-up with GI or ENT. Notes she looked at her medicine and saw it was supposed to be 40 mg daily. Has not increased dose at present. Denies change in diet. Denies abdominal pain, nausea or vomiting. Denies chest tightness, wheezing or SOB.   Past Medical History:  Diagnosis Date  . Colitis    Lymphocytic colitis; had 2 flares in her lifetime  . Hx of appendicitis     Current Outpatient Medications on File Prior to Visit  Medication Sig Dispense Refill  . ALPRAZolam (XANAX) 0.5 MG tablet Take 0.5 mg by mouth 2 (two) times daily as needed for anxiety.     Marland Kitchen buPROPion (WELLBUTRIN XL) 150 MG 24 hr tablet Take 1 tablet (150 mg total) by mouth daily. 90 tablet 2  . omeprazole (PRILOSEC) 20 MG capsule Take 1 capsule by mouth daily.    . sertraline (ZOLOFT) 25 MG tablet Take 1 tablet (25 mg total) by mouth every other day. For 1 week before stopping. 4 tablet 0  . traZODone (DESYREL) 50 MG tablet Take 0.5 tablets (25  mg total) by mouth at bedtime as needed for sleep. 5 tablet 0   No current facility-administered medications on file prior to visit.    Allergies  Allergen Reactions  . Latex   . Strattera [Atomoxetine] Palpitations    Family History  Problem Relation Age of Onset  . Heart failure Mother   . Lung cancer Mother        Smoker  . CAD Mother        s/p bypass  . Osteoporosis Mother   . Heart failure Father   . Hyperlipidemia Father   . Heart disease Father   . Atrial fibrillation Father   . Congestive Heart Failure Brother     Social History   Socioeconomic History  . Marital status: Married    Spouse name: Not on file  . Number of children: Not on file  . Years of education: Not on file  . Highest education level: Not on file  Occupational History  . Not on file  Tobacco Use  . Smoking status: Never Smoker  . Smokeless tobacco: Never Used  Vaping Use  . Vaping Use: Never used  Substance and Sexual Activity  . Alcohol use: Never  . Drug use: Never  . Sexual activity: Not on file  Other Topics Concern  . Not on file  Social History Narrative  . Not on file   Social Determinants of Health   Financial Resource Strain:   . Difficulty  of Paying Living Expenses: Not on file  Food Insecurity:   . Worried About Charity fundraiser in the Last Year: Not on file  . Ran Out of Food in the Last Year: Not on file  Transportation Needs:   . Lack of Transportation (Medical): Not on file  . Lack of Transportation (Non-Medical): Not on file  Physical Activity:   . Days of Exercise per Week: Not on file  . Minutes of Exercise per Session: Not on file  Stress:   . Feeling of Stress : Not on file  Social Connections:   . Frequency of Communication with Friends and Family: Not on file  . Frequency of Social Gatherings with Friends and Family: Not on file  . Attends Religious Services: Not on file  . Active Member of Clubs or Organizations: Not on file  . Attends Theatre manager Meetings: Not on file  . Marital Status: Not on file   Review of Systems - See HPI.  All other ROS are negative.  Resp 14   Ht 5' 5.5" (1.664 m)   Wt 119 lb (54 kg)   BMI 19.50 kg/m   Physical Exam Vitals reviewed.  Constitutional:      Appearance: Normal appearance.  HENT:     Head: Normocephalic and atraumatic.     Right Ear: Tympanic membrane normal.     Left Ear: Tympanic membrane normal.     Ears:     Comments: Significant bilateral clicking, popping of TMJ with R >>> L. TM within normal limits. No auricular adenopathy or mass. Some pain with ROM of jaw.    Nose: Nose normal.     Mouth/Throat:     Mouth: Mucous membranes are moist.     Pharynx: Oropharynx is clear.  Eyes:     Conjunctiva/sclera: Conjunctivae normal.  Cardiovascular:     Rate and Rhythm: Normal rate and regular rhythm.     Pulses: Normal pulses.     Heart sounds: Normal heart sounds.  Pulmonary:     Effort: Pulmonary effort is normal.     Breath sounds: Normal breath sounds.  Abdominal:     General: Bowel sounds are normal. There is no distension.     Palpations: Abdomen is soft. There is no mass.     Tenderness: There is no abdominal tenderness.     Hernia: No hernia is present.  Musculoskeletal:     Cervical back: Neck supple.  Neurological:     General: No focal deficit present.     Mental Status: She is alert and oriented to person, place, and time.  Psychiatric:        Mood and Affect: Mood normal.    Assessment/Plan: 1. Anxiety and depression Doing very well. Continue current regimen. Refill of Xanax provided. PDMP reviewed -- no red flags.  - ALPRAZolam (XANAX) 0.5 MG tablet; Take 1 tablet (0.5 mg total) by mouth 2 (two) times daily as needed for anxiety.  Dispense: 60 tablet; Refill: 1  2. Gastroesophageal reflux disease without esophagitis Increase Omeprazole to 40 mg daily as this was prescribed dose. Start GERD diet. If not improving will get her back in with GI for  assessment and potentially EGD.   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 Avoid chewing on affected side. Apply topical Voltaren around ear and TMJ. Start night guard. OTC medications discussed. Recommend she follow-up with her oral/maxillofacial specialist.   4. Chronic fatigue Noted at end of visit. Exam unremarkable.  Mood stable. Will check labs today to further assess.  - CBC w/Diff - Comprehensive metabolic panel; Future - TSH - B12 - Vitamin D (25 hydroxy)  This visit occurred during the SARS-CoV-2 public health emergency.  Safety protocols were in place, including screening questions prior to the visit, additional usage of staff PPE, and extensive cleaning of exam room while observing appropriate contact time as indicated for disinfecting solutions.     Leeanne Rio, PA-C

## 2019-12-18 NOTE — Patient Instructions (Addendum)
Please go to the lab today for blood work.  I will call you with your results. We will alter treatment regimen(s) if indicated by your results.   Please increase the Omeprazole to 40 mg daily. Follow the diet below. If symptoms are not improving/resolving, please let me know and we can get you in with specialist for further evaluation.  Avoid chewing on affected side.  Apply topical Voltaren around the ear and TMJ joint. I would recommend a night guard in case you are grinding teeth at night. We may want to consider oral maxillofacial specialist again.   Continue current medications for depression and anxiety.   Hang in there!

## 2019-12-20 ENCOUNTER — Encounter: Payer: Self-pay | Admitting: Physician Assistant

## 2020-01-10 NOTE — Progress Notes (Signed)
Subjective:   Nancy Smith is a 64 y.o. female who presents for an Initial Medicare Annual Wellness Visit.  I connected with Shatisha today by telephone and verified that I am speaking with the correct person using two identifiers. Location patient: home Location provider: work Persons participating in the virtual visit: patient, Marine scientist.    I discussed the limitations, risks, security and privacy concerns of performing an evaluation and management service by telephone and the availability of in person appointments. I also discussed with the patient that there may be a patient responsible charge related to this service. The patient expressed understanding and verbally consented to this telephonic visit.    Interactive audio and video telecommunications were attempted between this provider and patient, however failed, due to patient having technical difficulties OR patient did not have access to video capability.  We continued and completed visit with audio only.  Some vital signs may be absent or patient reported.   Time Spent with patient on telephone encounter: 30 minutes  Review of Systems     Cardiac Risk Factors include: advanced age (>72men, >75 women);dyslipidemia;sedentary lifestyle     Objective:    Today's Vitals   01/13/20 1545  Weight: 119 lb (54 kg)  Height: 5\' 5"  (1.651 m)   Body mass index is 19.8 kg/m.  Advanced Directives 01/13/2020 02/20/2019  Does Patient Have a Medical Advance Directive? No No  Would patient like information on creating a medical advance directive? Yes (MAU/Ambulatory/Procedural Areas - Information given) No - Patient declined    Current Medications (verified) Outpatient Encounter Medications as of 01/13/2020  Medication Sig  . ALPRAZolam (XANAX) 0.5 MG tablet Take 1 tablet (0.5 mg total) by mouth 2 (two) times daily as needed for anxiety.  Marland Kitchen buPROPion (WELLBUTRIN XL) 150 MG 24 hr tablet Take 1 tablet (150 mg total) by mouth daily.  Marland Kitchen  omeprazole (PRILOSEC) 20 MG capsule Take 1 capsule by mouth daily.  . sertraline (ZOLOFT) 25 MG tablet Take 1 tablet (25 mg total) by mouth every other day. For 1 week before stopping.  . traZODone (DESYREL) 50 MG tablet Take 0.5 tablets (25 mg total) by mouth at bedtime as needed for sleep.   No facility-administered encounter medications on file as of 01/13/2020.    Allergies (verified) Latex and Strattera [atomoxetine]   History: Past Medical History:  Diagnosis Date  . Colitis    Lymphocytic colitis; had 2 flares in her lifetime  . Hx of appendicitis   . Hyperlipidemia    Past Surgical History:  Procedure Laterality Date  . APPENDECTOMY    . DILATION AND CURETTAGE OF UTERUS  2008   fiborids  . EYE SURGERY  1996  . HERNIA REPAIR  1983   Femoral  . MOHS SURGERY     for Melanoma  . TONSILLECTOMY  1960   Family History  Problem Relation Age of Onset  . Heart failure Mother   . Lung cancer Mother        Smoker  . CAD Mother        s/p bypass  . Osteoporosis Mother   . Heart failure Father   . Hyperlipidemia Father   . Heart disease Father   . Atrial fibrillation Father   . Congestive Heart Failure Brother    Social History   Socioeconomic History  . Marital status: Married    Spouse name: Not on file  . Number of children: Not on file  . Years of education: Not on file  .  Highest education level: Not on file  Occupational History  . Not on file  Tobacco Use  . Smoking status: Never Smoker  . Smokeless tobacco: Never Used  Vaping Use  . Vaping Use: Never used  Substance and Sexual Activity  . Alcohol use: Yes    Comment: occasionally  . Drug use: Never  . Sexual activity: Not on file  Other Topics Concern  . Not on file  Social History Narrative  . Not on file   Social Determinants of Health   Financial Resource Strain: Low Risk   . Difficulty of Paying Living Expenses: Not hard at all  Food Insecurity: No Food Insecurity  . Worried About Paediatric nurse in the Last Year: Never true  . Ran Out of Food in the Last Year: Never true  Transportation Needs: No Transportation Needs  . Lack of Transportation (Medical): No  . Lack of Transportation (Non-Medical): No  Physical Activity: Sufficiently Active  . Days of Exercise per Week: 7 days  . Minutes of Exercise per Session: 50 min  Stress: No Stress Concern Present  . Feeling of Stress : Not at all  Social Connections: Socially Integrated  . Frequency of Communication with Friends and Family: More than three times a week  . Frequency of Social Gatherings with Friends and Family: Once a week  . Attends Religious Services: 1 to 4 times per year  . Active Member of Clubs or Organizations: Yes  . Attends Archivist Meetings: 1 to 4 times per year  . Marital Status: Married    Tobacco Counseling Counseling given: Not Answered   Clinical Intake:  Pre-visit preparation completed: Yes  Pain : No/denies pain     Nutritional Status: BMI of 19-24  Normal Nutritional Risks: None Diabetes: No  How often do you need to have someone help you when you read instructions, pamphlets, or other written materials from your doctor or pharmacy?: 1 - Never What is the last grade level you completed in school?: Master's Degree  Diabetic?No  Interpreter Needed?: No  Information entered by :: Caroleen Hamman LPN   Activities of Daily Living In your present state of health, do you have any difficulty performing the following activities: 01/13/2020 08/23/2019  Hearing? Y N  Comment mild -  Vision? N N  Difficulty concentrating or making decisions? N N  Walking or climbing stairs? N N  Dressing or bathing? N N  Doing errands, shopping? N N  Preparing Food and eating ? N -  Using the Toilet? N -  In the past six months, have you accidently leaked urine? Y -  Comment occasionally -  Do you have problems with loss of bowel control? N -  Managing your Medications? N -  Managing  your Finances? N -  Housekeeping or managing your Housekeeping? N -  Some recent data might be hidden    Patient Care Team: Delorse Limber as PCP - General (Family Medicine) Bernarda Caffey, MD as Consulting Physician (Ophthalmology)  Indicate any recent Medical Services you may have received from other than [facility] providers in the past year (date may be approximate).     Assessment:   This is a routine wellness examination for Nancy Smith.  Hearing/Vision screen  Hearing Screening   125Hz  250Hz  500Hz  1000Hz  2000Hz  3000Hz  4000Hz  6000Hz  8000Hz   Right ear:           Left ear:           Comments:  Mild hearing loss  Vision Screening Comments: Wears glasses Last eye exam-11/2019-Dr. Delman Cheadle  Dietary issues and exercise activities discussed: Current Exercise Habits: Home exercise routine, Type of exercise: walking, Time (Minutes): 60, Frequency (Times/Week): 7, Weekly Exercise (Minutes/Week): 420, Intensity: Mild, Exercise limited by: None identified  Goals    . Patient Stated     Would like to start walking & get back to doing Yoga      Depression Screen PHQ 2/9 Scores 01/13/2020 12/18/2019 08/23/2019 10/30/2018 01/02/2018  PHQ - 2 Score 0 0 0 0 0  PHQ- 9 Score - 0 5 - -  Exception Documentation - - Patient refusal - -    Fall Risk Fall Risk  01/13/2020 09/18/2019 08/23/2019 10/30/2018 01/02/2018  Falls in the past year? 0 0 0 0 No  Number falls in past yr: 0 0 0 - -  Injury with Fall? 0 0 0 0 -  Follow up Falls prevention discussed - Falls evaluation completed - -    Any stairs in or around the home? Yes  If so, are there any without handrails? No  Home free of loose throw rugs in walkways, pet beds, electrical cords, etc? Yes  Adequate lighting in your home to reduce risk of falls? Yes   ASSISTIVE DEVICES UTILIZED TO PREVENT FALLS:  Life alert? No  Use of a cane, walker or w/c? No  Grab bars in the bathroom? No  Shower chair or bench in shower? No  Elevated toilet seat or a  handicapped toilet? No   TIMED UP AND GO:  Was the test performed? No . Phone visit   Cognitive Function:No cognitive impairment noted.     6CIT Screen 01/13/2020  What Year? 0 points  What month? 0 points  What time? 0 points  Count back from 20 0 points  Months in reverse 0 points  Repeat phrase 0 points  Total Score 0    Immunizations Immunization History  Administered Date(s) Administered  . Influenza-Unspecified 01/09/2020  . PFIZER SARS-COV-2 Vaccination 06/02/2019, 06/26/2019, 01/09/2020  . Pneumococcal Conjugate-13 02/22/2019  . Td 10/21/2010  . Zoster Recombinat (Shingrix) 02/16/2018, 05/12/2018    TDAP status: Up to date   Flu Vaccine status: Up to date   Pneumococcal vaccine status: Up to date   Covid-19 vaccine status: Completed vaccines  Qualifies for Shingles Vaccine? No   Zostavax completed No   Shingrix Completed?: Yes  Screening Tests Health Maintenance  Topic Date Due  . Hepatitis C Screening  08/22/2020 (Originally 1955/12/15)  . DTAP VACCINES (1) 07/09/2078 (Originally 12/19/1955)  . PNA vac Low Risk Adult (2 of 2 - PPSV23) 02/22/2020  . DTaP/Tdap/Td (2 - Tdap) 10/20/2020  . TETANUS/TDAP  10/20/2020  . MAMMOGRAM  05/06/2021  . COLONOSCOPY  04/11/2024  . INFLUENZA VACCINE  Completed  . DEXA SCAN  Completed  . COVID-19 Vaccine  Completed    Health Maintenance  There are no preventive care reminders to display for this patient.  Colorectal cancer screening: Completed 04/12/2019. Repeat every 5 years   Mammogram status: Completed 05/07/2019. Repeat every year   Bone Density status: Completed 05/07/2019. Results reflect: Bone density results: OSTEOPENIA. Repeat every 2 years.  Lung Cancer Screening: (Low Dose CT Chest recommended if Age 83-80 years, 30 pack-year currently smoking OR have quit w/in 15years.) does not qualify.     Additional Screening:  Hepatitis C Screening: does qualify. Discuss with PCP  Vision Screening: Recommended  annual ophthalmology exams for early detection of glaucoma and other  disorders of the eye. Is the patient up to date with their annual eye exam?  Yes  Who is the provider or what is the name of the office in which the patient attends annual eye exams? Dr. Delman Cheadle   Dental Screening: Recommended annual dental exams for proper oral hygiene  Community Resource Referral / Chronic Care Management: CRR required this visit?  No   CCM required this visit?  No      Plan:     I have personally reviewed and noted the following in the patient's chart:   . Medical and social history . Use of alcohol, tobacco or illicit drugs  . Current medications and supplements . Functional ability and status . Nutritional status . Physical activity . Advanced directives . List of other physicians . Hospitalizations, surgeries, and ER visits in previous 12 months . Vitals . Screenings to include cognitive, depression, and falls . Referrals and appointments  In addition, I have reviewed and discussed with patient certain preventive protocols, quality metrics, and best practice recommendations. A written personalized care plan for preventive services as well as general preventive health recommendations were provided to patient.    Due to this being a telephonic visit, the after visit summary with patients personalized plan was offered to patient via mail or my-chart.  Patient would like to access on my-chart.    Marta Antu, LPN   [id number]/06/[id number]  Nurse Health Advisor  Nurse Notes: Patient states she was seen recently for a cough & she is still coughing. Appt made with PCP.

## 2020-01-13 ENCOUNTER — Ambulatory Visit (INDEPENDENT_AMBULATORY_CARE_PROVIDER_SITE_OTHER): Payer: Medicare PPO

## 2020-01-13 VITALS — Ht 65.0 in | Wt 119.0 lb

## 2020-01-13 DIAGNOSIS — Z0000 Encounter for general adult medical examination without abnormal findings: Secondary | ICD-10-CM

## 2020-01-13 NOTE — Patient Instructions (Signed)
Nancy Smith , Thank you for taking time to complete your Medicare Wellness Visit. I appreciate your ongoing commitment to your health goals. Please review the following plan we discussed and let me know if I can assist you in the future.   Screening recommendations/referrals: Colonoscopy: Completed 04/12/2019- Due 04/11/2024 Mammogram: Completed 05/07/2019-Due 05/06/2020 Bone Density: Completed 05/07/2019- Due 05/06/2021 Recommended yearly ophthalmology/optometry visit for glaucoma screening and checkup Recommended yearly dental visit for hygiene and checkup  Vaccinations: Influenza vaccine: Up to Date Pneumococcal vaccine: Up to Date Tdap vaccine: Up to Date- Due 10/20/2020 Shingles vaccine: Completed vaccines  Covid-19:Completed vaccines  Advanced directives: Information mailed today  Conditions/risks identified: See problem list  Next appointment: Follow up in one year for your annual wellness visit    Preventive Care 65 Years and Older, Female Preventive care refers to lifestyle choices and visits with your health care provider that can promote health and wellness. What does preventive care include?  A yearly physical exam. This is also called an annual well check.  Dental exams once or twice a year.  Routine eye exams. Ask your health care provider how often you should have your eyes checked.  Personal lifestyle choices, including:  Daily care of your teeth and gums.  Regular physical activity.  Eating a healthy diet.  Avoiding tobacco and drug use.  Limiting alcohol use.  Practicing safe sex.  Taking low-dose aspirin every day.  Taking vitamin and mineral supplements as recommended by your health care provider. What happens during an annual well check? The services and screenings done by your health care provider during your annual well check will depend on your age, overall health, lifestyle risk factors, and family history of disease. Counseling  Your health care  provider may ask you questions about your:  Alcohol use.  Tobacco use.  Drug use.  Emotional well-being.  Home and relationship well-being.  Sexual activity.  Eating habits.  History of falls.  Memory and ability to understand (cognition).  Work and work Statistician.  Reproductive health. Screening  You may have the following tests or measurements:  Height, weight, and BMI.  Blood pressure.  Lipid and cholesterol levels. These may be checked every 5 years, or more frequently if you are over 18 years old.  Skin check.  Lung cancer screening. You may have this screening every year starting at age 78 if you have a 30-pack-year history of smoking and currently smoke or have quit within the past 15 years.  Fecal occult blood test (FOBT) of the stool. You may have this test every year starting at age 66.  Flexible sigmoidoscopy or colonoscopy. You may have a sigmoidoscopy every 5 years or a colonoscopy every 10 years starting at age 11.  Hepatitis C blood test.  Hepatitis B blood test.  Sexually transmitted disease (STD) testing.  Diabetes screening. This is done by checking your blood sugar (glucose) after you have not eaten for a while (fasting). You may have this done every 1-3 years.  Bone density scan. This is done to screen for osteoporosis. You may have this done starting at age 9.  Mammogram. This may be done every 1-2 years. Talk to your health care provider about how often you should have regular mammograms. Talk with your health care provider about your test results, treatment options, and if necessary, the need for more tests. Vaccines  Your health care provider may recommend certain vaccines, such as:  Influenza vaccine. This is recommended every year.  Tetanus, diphtheria, and  acellular pertussis (Tdap, Td) vaccine. You may need a Td booster every 10 years.  Zoster vaccine. You may need this after age 71.  Pneumococcal 13-valent conjugate (PCV13)  vaccine. One dose is recommended after age 60.  Pneumococcal polysaccharide (PPSV23) vaccine. One dose is recommended after age 45. Talk to your health care provider about which screenings and vaccines you need and how often you need them. This information is not intended to replace advice given to you by your health care provider. Make sure you discuss any questions you have with your health care provider. Document Released: 04/24/2015 Document Revised: 12/16/2015 Document Reviewed: 01/27/2015 Elsevier Interactive Patient Education  2017 [place] Prevention in the Home Falls can cause injuries. They can happen to people of all ages. There are many things you can do to make your home safe and to help prevent falls. What can I do on the outside of my home?  Regularly fix the edges of walkways and driveways and fix any cracks.  Remove anything that might make you trip as you walk through a door, such as a raised step or threshold.  Trim any bushes or trees on the path to your home.  Use bright outdoor lighting.  Clear any walking paths of anything that might make someone trip, such as rocks or tools.  Regularly check to see if handrails are loose or broken. Make sure that both sides of any steps have handrails.  Any raised decks and porches should have guardrails on the edges.  Have any leaves, snow, or ice cleared regularly.  Use sand or salt on walking paths during winter.  Clean up any spills in your garage right away. This includes oil or grease spills. What can I do in the bathroom?  Use night lights.  Install grab bars by the toilet and in the tub and shower. Do not use towel bars as grab bars.  Use non-skid mats or decals in the tub or shower.  If you need to sit down in the shower, use a plastic, non-slip stool.  Keep the floor dry. Clean up any water that spills on the floor as soon as it happens.  Remove soap buildup in the tub or shower  regularly.  Attach bath mats securely with double-sided non-slip rug tape.  Do not have throw rugs and other things on the floor that can make you trip. What can I do in the bedroom?  Use night lights.  Make sure that you have a light by your bed that is easy to reach.  Do not use any sheets or blankets that are too big for your bed. They should not hang down onto the floor.  Have a firm chair that has side arms. You can use this for support while you get dressed.  Do not have throw rugs and other things on the floor that can make you trip. What can I do in the kitchen?  Clean up any spills right away.  Avoid walking on wet floors.  Keep items that you use a lot in easy-to-reach places.  If you need to reach something above you, use a strong step stool that has a grab bar.  Keep electrical cords out of the way.  Do not use floor polish or wax that makes floors slippery. If you must use wax, use non-skid floor wax.  Do not have throw rugs and other things on the floor that can make you trip. What can I do with my stairs?  Do not leave any items on the stairs.  Make sure that there are handrails on both sides of the stairs and use them. Fix handrails that are broken or loose. Make sure that handrails are as long as the stairways.  Check any carpeting to make sure that it is firmly attached to the stairs. Fix any carpet that is loose or worn.  Avoid having throw rugs at the top or bottom of the stairs. If you do have throw rugs, attach them to the floor with carpet tape.  Make sure that you have a light switch at the top of the stairs and the bottom of the stairs. If you do not have them, ask someone to add them for you. What else can I do to help prevent falls?  Wear shoes that:  Do not have high heels.  Have rubber bottoms.  Are comfortable and fit you well.  Are closed at the toe. Do not wear sandals.  If you use a stepladder:  Make sure that it is fully  opened. Do not climb a closed stepladder.  Make sure that both sides of the stepladder are locked into place.  Ask someone to hold it for you, if possible.  Clearly mark and make sure that you can see:  Any grab bars or handrails.  First and last steps.  Where the edge of each step is.  Use tools that help you move around (mobility aids) if they are needed. These include:  Canes.  Walkers.  Scooters.  Crutches.  Turn on the lights when you go into a dark area. Replace any light bulbs as soon as they burn out.  Set up your furniture so you have a clear path. Avoid moving your furniture around.  If any of your floors are uneven, fix them.  If there are any pets around you, be aware of where they are.  Review your medicines with your doctor. Some medicines can make you feel dizzy. This can increase your chance of falling. Ask your doctor what other things that you can do to help prevent falls. This information is not intended to replace advice given to you by your health care provider. Make sure you discuss any questions you have with your health care provider. Document Released: 01/22/2009 Document Revised: 09/03/2015 Document Reviewed: 05/02/2014 Elsevier Interactive Patient Education  2017 [employer].

## 2020-01-14 ENCOUNTER — Encounter: Payer: Self-pay | Admitting: Physician Assistant

## 2020-01-14 ENCOUNTER — Other Ambulatory Visit: Payer: Self-pay | Admitting: Physician Assistant

## 2020-01-15 ENCOUNTER — Encounter: Payer: Self-pay | Admitting: Physician Assistant

## 2020-01-15 ENCOUNTER — Telehealth (INDEPENDENT_AMBULATORY_CARE_PROVIDER_SITE_OTHER): Payer: Medicare PPO | Admitting: Physician Assistant

## 2020-01-15 ENCOUNTER — Other Ambulatory Visit: Payer: Self-pay

## 2020-01-15 DIAGNOSIS — R053 Chronic cough: Secondary | ICD-10-CM | POA: Diagnosis not present

## 2020-01-15 MED ORDER — OMEPRAZOLE 20 MG PO CPDR: 40.0000 mg | DELAYED_RELEASE_CAPSULE | Freq: Every day | ORAL | 1 refills | Status: DC

## 2020-01-15 NOTE — Progress Notes (Signed)
Virtual Visit via Telephone Note  I connected with Nancy Smith on 01/15/20 at  8:00 AM EDT by telephone and verified that I am speaking with the correct person using two identifiers.  Location: Patient: Home Provider: [facility]   I discussed the limitations, risks, security and privacy concerns of performing an evaluation and management service by telephone and the availability of in person appointments. I also discussed with the patient that there may be a patient responsible charge related to this service. The patient expressed understanding and agreed to proceed.  History of Present Illness: Patient presents via phone today as she is unable to utilize video platform.  Patient wanting to discuss ongoing cough.  Patient with history of chronic cough, initially thought to be secondary to GERD symptoms.  She was noting heartburn and indigestion with worsening of the symptoms and cough after her morning coffee.  Was started on PPI --Prilosec 40 mg daily.  Endorses taking as directed and is noted resolution of GERD symptoms.  Notes early a.m. and nighttime cough is resolved but still has cough throughout the day that seems unaffected by what she is eating or by environment.  Does have a very remote history of smoking, 0.25 packs/day x 20 years.  Denies any prior diagnosis of COPD.  Patient denies fever, chills, sweats or weight loss.  Denies any colored phlegm.  Denies chest pain, chest tightness, wheezing or shortness of breath.   Observations/Objective: Patient is well-developed, well-nourished in no acute distress.  Resting comfortably at home.  Head is normocephalic, atraumatic.  No labored breathing.  Speech is clear and coherent with logical content.  Patient is alert and oriented at baseline.   Assessment and Plan: 1. Chronic cough Likely multifactorial. GERD under better control with current regimen. Nighttime/early AM cough resolved. Still with daytime cough. Will  proceed with CXR and referral to pulmonology for further evaluation and management.  - Ambulatory referral to Pulmonology - DG Chest 2 View; Future   Follow Up Instructions:  I discussed the assessment and treatment plan with the patient. The patient was provided an opportunity to ask questions and all were answered. The patient agreed with the plan and demonstrated an understanding of the instructions.   The patient was advised to call back or seek an in-person evaluation if the symptoms worsen or if the condition fails to improve as anticipated.  I provided 15 minutes of non-face-to-face time during this encounter.   Leeanne Rio, PA-C

## 2020-01-20 ENCOUNTER — Other Ambulatory Visit: Payer: Self-pay

## 2020-01-20 ENCOUNTER — Ambulatory Visit (INDEPENDENT_AMBULATORY_CARE_PROVIDER_SITE_OTHER)
Admission: RE | Admit: 2020-01-20 | Discharge: 2020-01-20 | Disposition: A | Discharge status: Home / Self Care | Payer: Medicare PPO | Source: Ambulatory Visit | Attending: Physician Assistant | Admitting: Physician Assistant

## 2020-01-20 DIAGNOSIS — R053 Chronic cough: Secondary | ICD-10-CM

## 2020-01-23 ENCOUNTER — Telehealth: Payer: Self-pay

## 2020-01-23 DIAGNOSIS — F32A Depression, unspecified: Secondary | ICD-10-CM

## 2020-01-23 DIAGNOSIS — F419 Anxiety disorder, unspecified: Secondary | ICD-10-CM

## 2020-01-23 MED ORDER — TRAZODONE HCL 50 MG PO TABS: 50.0000 mg | ORAL_TABLET | Freq: Every evening | ORAL | 1 refills | Status: DC | PRN

## 2020-01-23 NOTE — Telephone Encounter (Signed)
Rx sent to the preferred patient pharmacy  

## 2020-01-23 NOTE — Telephone Encounter (Signed)
..   LAST APPOINTMENT DATE: 01/15/2020   NEXT APPOINTMENT DATE:@11 /02/2020  MEDICATION: Trazodone 50 mg   PHARMACY: [facility]   **Let patient know to contact pharmacy at the end of the day to make sure medication is ready. **  ** Please notify patient to allow 48-72 hours to process**  **Encourage patient to contact the pharmacy for refills or they can request refills through [facility]**  CLINICAL FILLS OUT ALL BELOW:   LAST REFILL:   QTY:  REFILL DATE:    OTHER COMMENTS: There was a change completed on this med on 10/5.  I do not see confirmation from pharmacy when reviewing med.  Patient ran out of this med yesterday.    Okay for refill?  Please advise

## 2020-02-13 ENCOUNTER — Ambulatory Visit: Payer: Medicare PPO | Admitting: Pulmonary Disease

## 2020-02-13 ENCOUNTER — Encounter: Payer: Self-pay | Admitting: Pulmonary Disease

## 2020-02-13 ENCOUNTER — Other Ambulatory Visit: Payer: Self-pay

## 2020-02-13 VITALS — BP 128/72 | HR 76 | Temp 98.6°F | Ht 65.0 in | Wt 119.6 lb

## 2020-02-13 DIAGNOSIS — R059 Cough, unspecified: Secondary | ICD-10-CM | POA: Diagnosis not present

## 2020-02-13 MED ORDER — CETIRIZINE HCL 10 MG PO TABS: 10.0000 mg | ORAL_TABLET | Freq: Every day | ORAL | 3 refills | Status: DC

## 2020-02-13 NOTE — Progress Notes (Signed)
Nancy Smith    [id number]    [birth year]  Primary Care Physician:Martin, Luanna Cole, PA-C  Referring Physician: Brunetta Jeans, PA-C [address] [place],  [place] [address]  Chief complaint:   Patient seen for cough  HPI:  Cough worsened in the last month voice feeling better at present Has a cough on and off for up to 5 years  Cough is not limiting Still able to tolerate activities normally  Concern for allergies Concern for reflux -Reflux medications have not made a difference  She was placed on allergy medications and inhalers in the past  Had more problems about 2 years ago when she moved back to [place]  Following the episode a month ago Had a chest x-ray which showed hyperinflated lung fields  Smoked in the remote past quit in 1994 -Pack of cigarette may last a week  She does have a dog-symptoms not worse around a pet  No pertinent occupational history  Usually very active Used to be a runner  [place] Exposed to secondhand smoke as a child   Outpatient Encounter Medications as of 02/13/2020  Medication Sig  . ALPRAZolam (XANAX) 0.5 MG tablet Take 1 tablet (0.5 mg total) by mouth 2 (two) times daily as needed for anxiety.  Marland Kitchen buPROPion (WELLBUTRIN XL) 150 MG 24 hr tablet Take 1 tablet (150 mg total) by mouth daily.  . calcium carbonate (TUMS EX) 750 MG chewable tablet Chew 1 tablet by mouth daily.  Marland Kitchen omeprazole (PRILOSEC) 20 MG capsule Take 2 capsules (40 mg total) by mouth daily. (Patient taking differently: Take 20 mg by mouth as needed. )  . sertraline (ZOLOFT) 100 MG tablet Take 1 tablet (100 mg total) by mouth daily.  . traZODone (DESYREL) 50 MG tablet Take 1 tablet (50 mg total) by mouth at bedtime as needed for sleep.  . cetirizine (ZYRTEC ALLERGY) 10 MG tablet Take 1 tablet (10 mg total) by mouth daily.   No facility-administered encounter medications on file as of 02/13/2020.    Allergies as of 02/13/2020 - Review  Complete 02/13/2020  Allergen Reaction Noted  . Latex  08/23/2019  . Strattera [atomoxetine] Palpitations 09/09/2019    Past Medical History:  Diagnosis Date  . Colitis    Lymphocytic colitis; had 2 flares in her lifetime  . Hx of appendicitis   . Hyperlipidemia     Past Surgical History:  Procedure Laterality Date  . APPENDECTOMY    . EYE SURGERY  1996  . HERNIA REPAIR  1983   Femoral  . MOHS SURGERY     for Melanoma  . TONSILLECTOMY  1960    Family History  Problem Relation Age of Onset  . Heart failure Mother   . Lung cancer Mother        Smoker  . CAD Mother        s/p bypass  . Osteoporosis Mother   . Heart failure Father   . Hyperlipidemia Father   . Heart disease Father   . Atrial fibrillation Father   . Congestive Heart Failure Brother     Social History   Socioeconomic History  . Marital status: Married    Spouse name: Not on file  . Number of children: Not on file  . Years of education: Not on file  . Highest education level: Master's degree (e.g., MA, MS, MEng, MEd, MSW, MBA)  Occupational History  . Not on file  Tobacco Use  . Smoking status: Former Smoker    Packs/day: 0.25    Years: 15.00    Pack years: 3.75    Types: Cigarettes    Quit date: 1994    Years since quitting: 27.8  . Smokeless tobacco: Never Used  Vaping Use  . Vaping Use: Never used  Substance and Sexual Activity  . Alcohol use: Yes    Comment: occasionally  . Drug use: Never  . Sexual activity: Not on file  Other Topics Concern  . Not on file  Social History Narrative  . Not on file   Social Determinants of Health   Financial Resource Strain: Low Risk   . Difficulty of Paying Living Expenses: Not hard at all  Food Insecurity: No Food Insecurity  . Worried About Charity fundraiser in the Last Year: Never true  . Ran Out of Food in the Last Year: Never true  Transportation Needs: No Transportation Needs  . Lack of Transportation (Medical): No  . Lack of  Transportation (Non-Medical): No  Physical Activity: Sufficiently Active  . Days of Exercise per Week: 7 days  . Minutes of Exercise per Session: 50 min  Stress: No Stress Concern Present  . Feeling of Stress : Not at all  Social Connections: Socially Integrated  . Frequency of Communication with Friends and Family: More than three times a week  . Frequency of Social Gatherings with Friends and Family: Once a week  . Attends Religious Services: 1 to 4 times per year  . Active Member of Clubs or Organizations: Yes  . Attends Archivist Meetings: 1 to 4 times per year  . Marital Status: Married  Human resources officer Violence: Not At Risk  . Fear of Current or Ex-Partner: No  . Emotionally Abused: No  . Physically Abused: No  . Sexually Abused: No    Review of Systems  Respiratory: Positive for cough. Negative for shortness of breath.     Vitals:   02/13/20 0906  BP: 128/72  Pulse: 76  Temp: 98.6 F (37 C)  SpO2: 98%     Physical Exam Constitutional:      Appearance: She is obese.  HENT:     Head: Normocephalic.     Mouth/Throat:     Pharynx: No oropharyngeal exudate.  Eyes:     General:        Right eye: No discharge.        Left eye: No discharge.  Cardiovascular:     Rate and Rhythm: Normal rate and regular rhythm.     Heart sounds: No murmur heard.  No friction rub.  Pulmonary:     Effort: No respiratory distress.     Breath sounds: Normal breath sounds. No stridor. No wheezing or rhonchi.  Musculoskeletal:     Cervical back: No rigidity or tenderness.  Lymphadenopathy:     Cervical: No cervical adenopathy.  Neurological:     Mental Status: She is alert.  Psychiatric:        Mood and Affect: Mood normal.    Data Reviewed: Chest x-ray showed reviewed showing hyperinflated lung fields, no acute infiltrate Reviewed with the patient  Assessment:  Cough  Environmental allergies  Hyperinflated lung fields on chest  x-ray  Plan/Recommendations: Obtain pulmonary function test  Use of antihistamine -Zyrtec around-the-clock for at least 4 to 6 weeks to see if this helps the coughing  Cough likely related to allergies  Call with any significant concerns   Nancy Smith  Ander Slade MD [facility] 02/13/2020, 9:32 AM  CC: Brunetta Jeans, PA-C

## 2020-02-13 NOTE — Patient Instructions (Signed)
Cough -Most likely related to allergies -Use of Zyrtec on a daily basis for 4 to 6 weeks  We will obtain a breathing study -This checks out lung functions -Explained further the big lung volumes that we saw on the x-ray -Hyperactivity in your airway  Continue regular exercises  Call with significant concerns  I will follow-up with you in 3 months

## 2020-02-20 ENCOUNTER — Encounter: Payer: Self-pay | Admitting: Physician Assistant

## 2020-02-20 ENCOUNTER — Other Ambulatory Visit: Payer: Self-pay

## 2020-02-20 ENCOUNTER — Ambulatory Visit (INDEPENDENT_AMBULATORY_CARE_PROVIDER_SITE_OTHER): Payer: Medicare PPO | Admitting: Physician Assistant

## 2020-02-20 VITALS — BP 120/78 | HR 56 | Temp 98.0°F | Resp 14 | Ht 65.0 in | Wt 121.0 lb

## 2020-02-20 DIAGNOSIS — F419 Anxiety disorder, unspecified: Secondary | ICD-10-CM | POA: Diagnosis not present

## 2020-02-20 DIAGNOSIS — Z0000 Encounter for general adult medical examination without abnormal findings: Secondary | ICD-10-CM | POA: Diagnosis not present

## 2020-02-20 DIAGNOSIS — Z7689 Persons encountering health services in other specified circumstances: Secondary | ICD-10-CM

## 2020-02-20 DIAGNOSIS — E7800 Pure hypercholesterolemia, unspecified: Secondary | ICD-10-CM | POA: Diagnosis not present

## 2020-02-20 DIAGNOSIS — F32A Depression, unspecified: Secondary | ICD-10-CM

## 2020-02-20 LAB — COMPREHENSIVE METABOLIC PANEL
ALT: 10 U/L (ref 0–35)
AST: 17 U/L (ref 0–37)
Albumin: 4.2 g/dL (ref 3.5–5.2)
Alkaline Phosphatase: 48 U/L (ref 39–117)
BUN: 13 mg/dL (ref 6–23)
CO2: 30 mEq/L (ref 19–32)
Calcium: 9.2 mg/dL (ref 8.4–10.5)
Chloride: 102 mEq/L (ref 96–112)
Creatinine, Ser: 0.83 mg/dL (ref 0.40–1.20)
GFR: 73.5 mL/min (ref >60.00)
Glucose, Bld: 93 mg/dL (ref 70–99)
Potassium: 4.1 mEq/L (ref 3.5–5.1)
Sodium: 138 mEq/L (ref 135–145)
Total Bilirubin: 0.5 mg/dL (ref 0.2–1.2)
Total Protein: 6.1 g/dL (ref 6.0–8.3)

## 2020-02-20 LAB — CBC WITH DIFFERENTIAL/PLATELET
Basophils Absolute: 0.1 10*3/uL (ref 0.0–0.1)
Basophils Relative: 1.4 % (ref 0.0–3.0)
Eosinophils Absolute: 0.1 10*3/uL (ref 0.0–0.7)
Eosinophils Relative: 3.2 % (ref 0.0–5.0)
HCT: 37.6 % (ref 36.0–46.0)
Hemoglobin: 12.6 g/dL (ref 12.0–15.0)
Lymphocytes Relative: 33.1 % (ref 12.0–46.0)
Lymphs Abs: 1.4 10*3/uL (ref 0.7–4.0)
MCHC: 33.6 g/dL (ref 30.0–36.0)
MCV: 88.4 fl (ref 78.0–100.0)
Monocytes Absolute: 0.3 10*3/uL (ref 0.1–1.0)
Monocytes Relative: 8.2 % (ref 3.0–12.0)
Neutro Abs: 2.2 10*3/uL (ref 1.4–7.7)
Neutrophils Relative %: 54.1 % (ref 43.0–77.0)
Platelets: 214 10*3/uL (ref 150.0–400.0)
RBC: 4.25 Mil/uL (ref 3.87–5.11)
RDW: 13 % (ref 11.5–15.5)
WBC: 4.1 10*3/uL (ref 4.0–10.5)

## 2020-02-20 LAB — LIPID PANEL
Cholesterol: 208 mg/dL — ABNORMAL HIGH (ref 0–200)
HDL: 84.8 mg/dL (ref >39.00)
LDL Cholesterol: 115 mg/dL — ABNORMAL HIGH (ref 0–99)
NonHDL: 122.99
Total CHOL/HDL Ratio: 2
Triglycerides: 41 mg/dL (ref 0.0–149.0)
VLDL: 8.2 mg/dL (ref 0.0–40.0)

## 2020-02-20 LAB — HEMOGLOBIN A1C: Hgb A1c MFr Bld: 5.6 % (ref 4.6–6.5)

## 2020-02-20 MED ORDER — ALPRAZOLAM 0.5 MG PO TABS: 0.5000 mg | ORAL_TABLET | Freq: Two times a day (BID) | ORAL | 1 refills | Status: DC | PRN

## 2020-02-20 NOTE — Progress Notes (Signed)
Patient presents to clinic today for annual exam.  Patient is fasting for labs.  Acute Concerns: Denies acute concerns at today's visit  Chronic Issues: Hyperlipidemia -- Patient is diet-controlled. Notes well-balanced diet. Tries to walk her dog daily. Body mass index is 20.14 kg/m.  Anxiety/Depression -- Currently on a combination of Wellbutrin, Trazodone and Alprazolam. Doing very well. States she feels the best she has in years. Denies breakthrough symptoms. Notes stable appetite and good, restorative sleep.   Health Maintenance: Immunizations -- up-to-date Colon Cancer Screening -- up-to-date Mammogram -- up-to-date Bone Density -- up-to-date  Past Medical History:  Diagnosis Date   Colitis    Lymphocytic colitis; had 2 flares in her lifetime   Hx of appendicitis    Hyperlipidemia     Past Surgical History:  Procedure Laterality Date   [facility]   Femoral   MOHS SURGERY     for Melanoma   TONSILLECTOMY  1960    Current Outpatient Medications on File Prior to Visit  Medication Sig Dispense Refill   ALPRAZolam (XANAX) 0.5 MG tablet Take 1 tablet (0.5 mg total) by mouth 2 (two) times daily as needed for anxiety. 60 tablet 1   buPROPion (WELLBUTRIN XL) 150 MG 24 hr tablet Take 1 tablet (150 mg total) by mouth daily. 90 tablet 2   calcium carbonate (TUMS EX) 750 MG chewable tablet Chew 1 tablet by mouth daily.     cetirizine (ZYRTEC ALLERGY) 10 MG tablet Take 1 tablet (10 mg total) by mouth daily. 30 tablet 3   omeprazole (PRILOSEC) 20 MG capsule Take 2 capsules (40 mg total) by mouth daily. (Patient taking differently: Take 20 mg by mouth as needed. ) 60 capsule 1   sertraline (ZOLOFT) 100 MG tablet Take 1 tablet (100 mg total) by mouth daily. 30 tablet 3   traZODone (DESYREL) 50 MG tablet Take 1 tablet (50 mg total) by mouth at bedtime as needed for sleep. 90 tablet 1   No current facility-administered  medications on file prior to visit.    Allergies  Allergen Reactions   Latex    Strattera [Atomoxetine] Palpitations    Family History  Problem Relation Age of Onset   Heart failure Mother    Lung cancer Mother        Smoker   CAD Mother        s/p bypass   Osteoporosis Mother    Heart failure Father    Hyperlipidemia Father    Heart disease Father    Atrial fibrillation Father    Congestive Heart Failure Brother     Social History   Socioeconomic History   Marital status: Married    Spouse name: Not on file   Number of children: Not on file   Years of education: Not on file   Highest education level: Master's degree (e.g., MA, MS, MEng, MEd, MSW, MBA)  Occupational History   Not on file  Tobacco Use   Smoking status: Former Smoker    Packs/day: 0.25    Years: 15.00    Pack years: 3.75    Types: Cigarettes    Quit date: 1994    Years since quitting: 27.8   Smokeless tobacco: Never Used  Scientific laboratory technician Use: Never used  Substance and Sexual Activity   Alcohol use: Yes    Comment: occasionally   Drug use: Never   Sexual activity:  Not on file  Other Topics Concern   Not on file  Social History Narrative   Not on file   Social Determinants of Health   Financial Resource Strain: Low Risk    Difficulty of Paying Living Expenses: Not hard at all  Food Insecurity: No Food Insecurity   Worried About [place] in the Last Year: Never true   [place] in the Last Year: Never true  Transportation Needs: No Transportation Needs   Lack of Transportation (Medical): No   Lack of Transportation (Non-Medical): No  Physical Activity: Sufficiently Active   Days of Exercise per Week: 7 days   Minutes of Exercise per Session: 50 min  Stress: No Stress Concern Present   Feeling of Stress : Not at all  Social Connections: Socially Integrated   Frequency of Communication with Friends and Family: More than three times a  week   Frequency of Social Gatherings with Friends and Family: Once a week   Attends Religious Services: 1 to 4 times per year   Active Member of [employer] or Organizations: Yes   Attends Archivist Meetings: 1 to 4 times per year   Marital Status: Married  Human resources officer Violence: Not At Risk   Fear of Current or Ex-Partner: No   Emotionally Abused: No   Physically Abused: No   Sexually Abused: No   Review of Systems  Constitutional: Negative for fever and weight loss.  HENT: Negative for ear discharge, ear pain, hearing loss and tinnitus.   Eyes: Negative for blurred vision, double vision, photophobia and pain.  Respiratory: Negative for cough and shortness of breath.   Cardiovascular: Negative for chest pain and palpitations.  Gastrointestinal: Negative for abdominal pain, blood in stool, constipation, diarrhea, heartburn, melena, nausea and vomiting.  Genitourinary: Negative for dysuria, flank pain, frequency, hematuria and urgency.  Musculoskeletal: Negative for falls.  Neurological: Negative for dizziness, loss of consciousness and headaches.  Endo/Heme/Allergies: Negative for environmental allergies.  Psychiatric/Behavioral: Negative for depression, hallucinations, substance abuse and suicidal ideas. The patient is not nervous/anxious and does not have insomnia.     There were no vitals taken for this visit.  Physical Exam Vitals reviewed.  HENT:     Head: Normocephalic and atraumatic.     Right Ear: Tympanic membrane, ear canal and external ear normal.     Left Ear: Tympanic membrane, ear canal and external ear normal.     Nose: Nose normal. No mucosal edema.     Mouth/Throat:     Pharynx: Uvula midline. No oropharyngeal exudate or posterior oropharyngeal erythema.  Eyes:     Conjunctiva/sclera: Conjunctivae normal.     Pupils: Pupils are equal, round, and reactive to light.  Neck:     Thyroid: No thyromegaly.  Cardiovascular:     Rate and Rhythm:  Normal rate and regular rhythm.     Heart sounds: Normal heart sounds.  Pulmonary:     Effort: Pulmonary effort is normal. No respiratory distress.     Breath sounds: Normal breath sounds. No wheezing or rales.  Abdominal:     General: Bowel sounds are normal. There is no distension.     Palpations: Abdomen is soft. There is no mass.     Tenderness: There is no abdominal tenderness. There is no guarding or rebound.  Musculoskeletal:     Cervical back: Neck supple.  Lymphadenopathy:     Cervical: No cervical adenopathy.  Skin:    General: Skin is warm  and dry.     Findings: No rash.  Neurological:     Mental Status: She is alert and oriented to person, place, and time.     Cranial Nerves: No cranial nerve deficit.     Recent Results (from the past 2160 hour(s))  CBC w/Diff     Status: None   Collection Time: 12/18/19  2:17 PM  Result Value Ref Range   WBC 5.9 4.0 - 10.5 K/uL   RBC 4.29 3.87 - 5.11 Mil/uL   Hemoglobin 12.8 12.0 - 15.0 g/dL   HCT 38.5 36 - 46 %   MCV 89.8 78.0 - 100.0 fl   MCHC 33.2 30.0 - 36.0 g/dL   RDW 13.2 11.5 - 15.5 %   Platelets 252.0 150 - 400 K/uL   Neutrophils Relative % 65.6 43 - 77 %   Lymphocytes Relative 23.4 12 - 46 %   Monocytes Relative 8.1 3 - 12 %   Eosinophils Relative 2.3 0 - 5 %   Basophils Relative 0.6 0 - 3 %   Neutro Abs 3.9 1.4 - 7.7 K/uL   Lymphs Abs 1.4 0.7 - 4.0 K/uL   Monocytes Absolute 0.5 0.1 - 1.0 K/uL   Eosinophils Absolute 0.1 0.0 - 0.7 K/uL   Basophils Absolute 0.0 0.0 - 0.1 K/uL  TSH     Status: None   Collection Time: 12/18/19  2:17 PM  Result Value Ref Range   TSH 1.49 0.35 - 4.50 uIU/mL  B12     Status: None   Collection Time: 12/18/19  2:17 PM  Result Value Ref Range   Vitamin B-12 804 211 - 911 pg/mL  Vitamin D (25 hydroxy)     Status: None   Collection Time: 12/18/19  2:17 PM  Result Value Ref Range   VITD 46.92 30.00 - 100.00 ng/mL    Assessmnt/Plan: .1. Visit for preventive health  examination Depression screen negative. Health Maintenance reviewed. Preventive schedule discussed and handout given in AVS. Will obtain fasting labs today.  - Comprehensive metabolic panel - CBC with Differential/Platelet  2. Hypercholesterolemia Repeat fasting labs today. Dietary and exercise recommendations reviewed with the patient.  - Comprehensive metabolic panel - Hemoglobin A1c - Lipid panel  3. Anxiety and depression Stable. Continue current regimen. Follow-up 6 months.   4. Referral of patient Referral to GYN placed per patient request.  - Ambulatory referral to Gynecology  This visit occurred during the SARS-CoV-2 public health emergency.  Safety protocols were in place, including screening questions prior to the visit, additional usage of staff PPE, and extensive cleaning of exam room while observing appropriate contact time as indicated for disinfecting solutions.    Leeanne Rio, PA-C

## 2020-02-20 NOTE — Patient Instructions (Signed)
Please go to the lab for blood work.   Our office will call you with your results unless you have chosen to receive results via MyChart.  If your blood work is normal we will follow-up each year for physicals and as scheduled for chronic medical problems.  If anything is abnormal we will treat accordingly and get you in for a follow-up.  You will be contacted by Dr. Assunta Curtis office for a GYN appointment.  Let's plan to follow-up in 6 months for your mood.  Sooner if needed!   Preventive Care 70 Years and Older, Female Preventive care refers to lifestyle choices and visits with your health care provider that can promote health and wellness. This includes:  A yearly physical exam. This is also called an annual well check.  Regular dental and eye exams.  Immunizations.  Screening for certain conditions.  Healthy lifestyle choices, such as diet and exercise. What can I expect for my preventive care visit? Physical exam Your health care provider will check:  Height and weight. These may be used to calculate body mass index (BMI), which is a measurement that tells if you are at a healthy weight.  Heart rate and blood pressure.  Your skin for abnormal spots. Counseling Your health care provider may ask you questions about:  Alcohol, tobacco, and drug use.  Emotional well-being.  Home and relationship well-being.  Sexual activity.  Eating habits.  History of falls.  Memory and ability to understand (cognition).  Work and work Statistician.  Pregnancy and menstrual history. What immunizations do I need?  Influenza (flu) vaccine  This is recommended every year. Tetanus, diphtheria, and pertussis (Tdap) vaccine  You may need a Td booster every 10 years. Varicella (chickenpox) vaccine  You may need this vaccine if you have not already been vaccinated. Zoster (shingles) vaccine  You may need this after age 58. Pneumococcal conjugate (PCV13) vaccine  One dose  is recommended after age 58. Pneumococcal polysaccharide (PPSV23) vaccine  One dose is recommended after age 53. Measles, mumps, and rubella (MMR) vaccine  You may need at least one dose of MMR if you were born in 1957 or later. You may also need a second dose. Meningococcal conjugate (MenACWY) vaccine  You may need this if you have certain conditions. Hepatitis A vaccine  You may need this if you have certain conditions or if you travel or work in places where you may be exposed to hepatitis A. Hepatitis B vaccine  You may need this if you have certain conditions or if you travel or work in places where you may be exposed to hepatitis B. Haemophilus influenzae type b (Hib) vaccine  You may need this if you have certain conditions. You may receive vaccines as individual doses or as more than one vaccine together in one shot (combination vaccines). Talk with your health care provider about the risks and benefits of combination vaccines. What tests do I need? Blood tests  Lipid and cholesterol levels. These may be checked every 5 years, or more frequently depending on your overall health.  Hepatitis C test.  Hepatitis B test. Screening  Lung cancer screening. You may have this screening every year starting at age 75 if you have a 30-pack-year history of smoking and currently smoke or have quit within the past 15 years.  Colorectal cancer screening. All adults should have this screening starting at age 72 and continuing until age 80. Your health care provider may recommend screening at age 43 if you  are at increased risk. You will have tests every 1-10 years, depending on your results and the type of screening test.  Diabetes screening. This is done by checking your blood sugar (glucose) after you have not eaten for a while (fasting). You may have this done every 1-3 years.  Mammogram. This may be done every 1-2 years. Talk with your health care provider about how often you should  have regular mammograms.  BRCA-related cancer screening. This may be done if you have a family history of breast, ovarian, tubal, or peritoneal cancers. Other tests  Sexually transmitted disease (STD) testing.  Bone density scan. This is done to screen for osteoporosis. You may have this done starting at age 22. Follow these instructions at home: Eating and drinking  Eat a diet that includes fresh fruits and vegetables, whole grains, lean protein, and low-fat dairy products. Limit your intake of foods with high amounts of sugar, saturated fats, and salt.  Take vitamin and mineral supplements as recommended by your health care provider.  Do not drink alcohol if your health care provider tells you not to drink.  If you drink alcohol: ? Limit how much you have to 0-1 drink a day. ? Be aware of how much alcohol is in your drink. In the [place] one drink equals one 12 oz bottle of beer (355 mL), one 5 oz glass of wine (148 mL), or one 1 oz glass of hard liquor (44 mL). Lifestyle  Take daily care of your teeth and gums.  Stay active. Exercise for at least 30 minutes on 5 or more days each week.  Do not use any products that contain nicotine or tobacco, such as cigarettes, e-cigarettes, and chewing tobacco. If you need help quitting, ask your health care provider.  If you are sexually active, practice safe sex. Use a condom or other form of protection in order to prevent STIs (sexually transmitted infections).  Talk with your health care provider about taking a low-dose aspirin or statin. What's next?  Go to your health care provider once a year for a well check visit.  Ask your health care provider how often you should have your eyes and teeth checked.  Stay up to date on all vaccines. This information is not intended to replace advice given to you by your health care provider. Make sure you discuss any questions you have with your health care provider. Document Revised: 03/22/2018  Document Reviewed: 03/22/2018 Elsevier Patient Education  2020 [employer].

## 2020-02-20 NOTE — Addendum Note (Signed)
Addended by: Brunetta Jeans on: 02/20/2020 09:05 AM   Modules accepted: Orders

## 2020-03-27 ENCOUNTER — Other Ambulatory Visit: Payer: Self-pay

## 2020-03-27 ENCOUNTER — Encounter: Payer: Self-pay | Admitting: Physician Assistant

## 2020-03-27 ENCOUNTER — Telehealth (INDEPENDENT_AMBULATORY_CARE_PROVIDER_SITE_OTHER): Payer: Medicare PPO | Admitting: Physician Assistant

## 2020-03-27 DIAGNOSIS — K2900 Acute gastritis without bleeding: Secondary | ICD-10-CM

## 2020-03-27 DIAGNOSIS — R195 Other fecal abnormalities: Secondary | ICD-10-CM | POA: Diagnosis not present

## 2020-03-27 NOTE — Progress Notes (Signed)
I have discussed the procedure for the virtual visit with the patient who has given consent to proceed with assessment and treatment.   Londa Mackowski S Valla Pacey, CMA     

## 2020-03-27 NOTE — Progress Notes (Signed)
Virtual Visit via Video   I connected with patient on 03/27/20 at  2:30 PM EST by a video enabled telemedicine application and verified that I am speaking with the correct person using two identifiers.  Location patient: Home Location provider: [facility], Office Persons participating in the virtual visit: Patient, Provider, [place] (Patina Moore)  I discussed the limitations of evaluation and management by telemedicine and the availability of in person appointments. The patient expressed understanding and agreed to proceed.  Subjective:   HPI:   Patient presents via [place] today c/o upper abdominal pain mainly middle and LUQ starting Sunday. Has been noting gflare of heartburn recently for which she has been taking TUMS. Is prescribed Prilosec for GERD but stopped taking several weeks ago as she did not feel it was needed. Notes bloating and early fulness. Is still having BM 1-2 x daily without blood or tenesmus noted. Is concerned about mild constipation so she notes she took a Dulcolax. .  ROS:   See pertinent positives and negatives per HPI.  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   Osteopenia 09/09/2019   Dense breast tissue on mammogram 09/09/2019   Hypercholesterolemia 09/09/2019   History of colon polyps 09/09/2019   Hemangioma of liver 09/09/2019   Hepatic cyst 09/09/2019   History of posterior vitreous detachment 09/09/2019   Age-related cataract of both eyes 09/09/2019   Anxiety and depression 09/02/2019   Gastroesophageal reflux disease 09/02/2019    Social History   Tobacco Use   Smoking status: Former Smoker    Packs/day: 0.25    Years: 15.00    Pack years: 3.75    Types: Cigarettes    Quit date: 1994    Years since quitting: 27.9   Smokeless tobacco: Never Used  Substance Use Topics    Alcohol use: Yes    Comment: occasionally    Current Outpatient Medications:    ALPRAZolam (XANAX) 0.5 MG tablet, Take 1 tablet (0.5 mg total) by mouth 2 (two) times daily as needed for anxiety., Disp: 60 tablet, Rfl: 1   buPROPion (WELLBUTRIN XL) 150 MG 24 hr tablet, Take 1 tablet (150 mg total) by mouth daily., Disp: 90 tablet, Rfl: 2   calcium carbonate (TUMS EX) 750 MG chewable tablet, Chew 1 tablet by mouth daily., Disp: , Rfl:    cetirizine (ZYRTEC ALLERGY) 10 MG tablet, Take 1 tablet (10 mg total) by mouth daily., Disp: 30 tablet, Rfl: 3   omeprazole (PRILOSEC) 20 MG capsule, Take 2 capsules (40 mg total) by mouth daily. (Patient taking differently: Take 20 mg by mouth as needed.), Disp: 60 capsule, Rfl: 1   sertraline (ZOLOFT) 100 MG tablet, Take 1 tablet (100 mg total) by mouth daily., Disp: 30 tablet, Rfl: 3   traZODone (DESYREL) 50 MG tablet, Take 1 tablet (50 mg total) by mouth at bedtime as needed for sleep., Disp: 90 tablet, Rfl: 1  Allergies  Allergen Reactions   Latex    Strattera [Atomoxetine] Palpitations    Objective:   There were no vitals taken for this visit.  Patient is well-developed, well-nourished in no acute distress.  Resting comfortably at home.  Head is normocephalic, atraumatic.  No labored breathing.  Speech is clear and coherent with logical content.  Patient is alert and oriented at baseline.   Assessment and Plan:   1. Acute  gastritis without hemorrhage, unspecified gastritis type 2. Change in stool Suspected mild gastritis due to history of GERD and her recently stopping her PPI. Restart Prilosec. Start daily probiotic. Bowel regimen reviewed in case of mild constipation. In office follow-up discussed. Referral to new GI placed per patient preference.     Leeanne Rio, PA-C 03/27/2020

## 2020-03-27 NOTE — Patient Instructions (Signed)
Instructions sent to MyChart

## 2020-04-06 ENCOUNTER — Telehealth: Payer: Self-pay | Admitting: Internal Medicine

## 2020-04-06 NOTE — Telephone Encounter (Signed)
Good afternoon Dr. Marina Goodell, this patient was previously seen by you in 2008.  She was then seen by other GI specialist in 2020, but would like to transfer back to you since she is not happy with her care there.  Can you please advise on scheduling?  Thank you.

## 2020-04-07 NOTE — Telephone Encounter (Signed)
Keep appointment with Doug Sou, [facility], as already scheduled 05-07-20.

## 2020-04-20 ENCOUNTER — Other Ambulatory Visit: Payer: Self-pay | Admitting: Physician Assistant

## 2020-04-20 DIAGNOSIS — F419 Anxiety disorder, unspecified: Secondary | ICD-10-CM

## 2020-04-20 DIAGNOSIS — F32A Depression, unspecified: Secondary | ICD-10-CM

## 2020-04-28 ENCOUNTER — Telehealth: Payer: Self-pay | Admitting: Physician Assistant

## 2020-04-28 DIAGNOSIS — F419 Anxiety disorder, unspecified: Secondary | ICD-10-CM

## 2020-04-28 MED ORDER — SERTRALINE HCL 100 MG PO TABS: 100.0000 mg | ORAL_TABLET | Freq: Every day | ORAL | 2 refills | Status: DC

## 2020-04-28 NOTE — Telephone Encounter (Signed)
Rx sent to the preferred patient pharmacy  

## 2020-04-28 NOTE — Telephone Encounter (Signed)
Pt called in asking for a refill on the Sertraline pt needs this done asap since she needs to leave to go to [facility] since her brother had open heart surgery. Pt uses [facility] on [address].  Please advise

## 2020-05-05 ENCOUNTER — Telehealth: Payer: Self-pay

## 2020-05-05 DIAGNOSIS — F32A Depression, unspecified: Secondary | ICD-10-CM

## 2020-05-05 MED ORDER — ALPRAZOLAM 0.5 MG PO TABS: 0.5000 mg | ORAL_TABLET | Freq: Two times a day (BID) | ORAL | 1 refills | Status: DC | PRN

## 2020-05-05 NOTE — Telephone Encounter (Signed)
  LAST APPOINTMENT DATE: 02/20/20   NEXT APPOINTMENT DATE:@Visit  date not found  MEDICATION: ALPRAZolam Duanne Moron) 0.5 MG tablet  PHARMACY: [facility], Address: [address], [place], [place] [address]   Comments: Patient is out of town for [employer].    Please advise

## 2020-05-05 NOTE — Telephone Encounter (Signed)
Xanax last rx 02/20/20 #60 1 RF LOV: 02/20/20 CPE

## 2020-05-05 NOTE — Addendum Note (Signed)
Addended by: Brunetta Jeans on: 05/05/2020 11:29 AM   Modules accepted: Orders

## 2020-05-05 NOTE — Telephone Encounter (Signed)
Medication has been sent to the requested pharmacy. 

## 2020-05-07 ENCOUNTER — Ambulatory Visit: Payer: Medicare PPO | Admitting: Gastroenterology

## 2020-06-17 IMAGING — MR MR ABDOMEN WO/W CM
9 of 18 series · 21 of 48 positions shown · IV contrast (Yes)
Comparison: Ultrasound of 05/30/2018

CLINICAL DATA: Right abdominal discomfort radiating into the back.
Several nonspecific liver lesions were observed at sonography of
05/30/2018.

EXAM:
MRI ABDOMEN WITHOUT AND WITH CONTRAST
TECHNIQUE: Multiplanar multisequence MR imaging of the abdomen was performed
both before and after the administration of intravenous contrast.
CONTRAST:  5 cc Gadavist

[Series 3: T2 fat-sat · axial · 5.0mm · 0.78mm/px · z∈[-101,+164]mm · 2 of 54 slices shown]
[im 1/54]
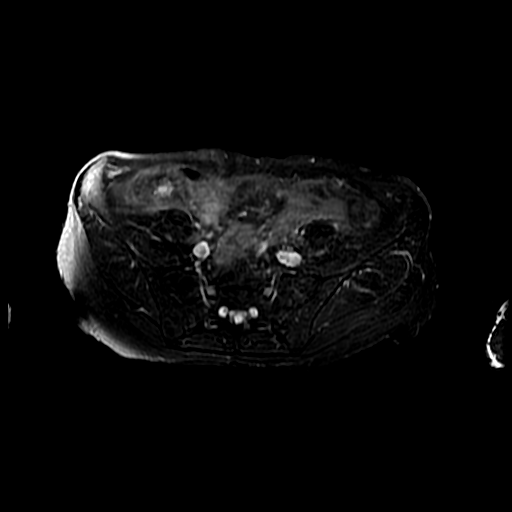
[im 54/54]
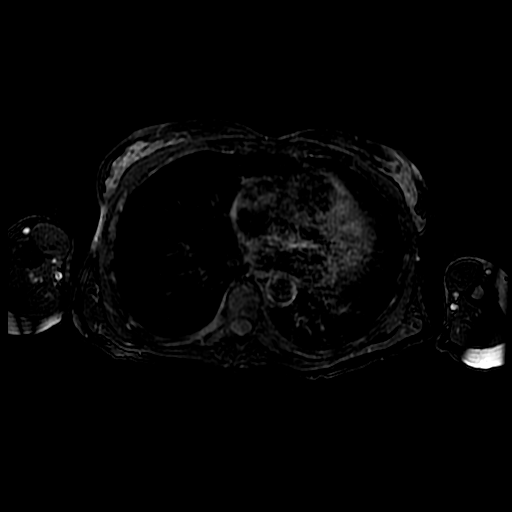

[Series 4: DWI b500 · axial · 6.0mm · 1.48mm/px · z∈[-99,+174]mm · 3 of 72 slices shown]
[im 1/72]
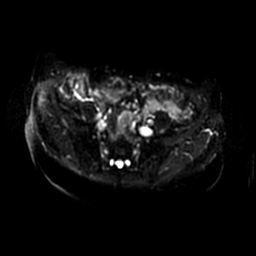
[im 36/72]
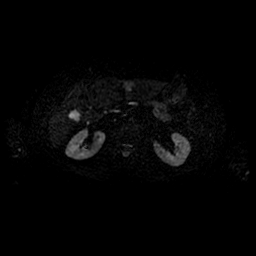
[im 72/72]
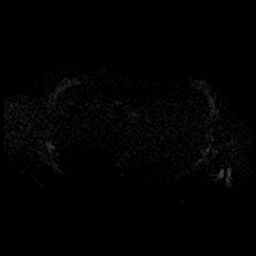

[Series 6: T2 · axial · 5.0mm · 0.78mm/px · z∈[-101,+164]mm · 2 of 54 slices shown (1 of 2)]
[im 1/54]
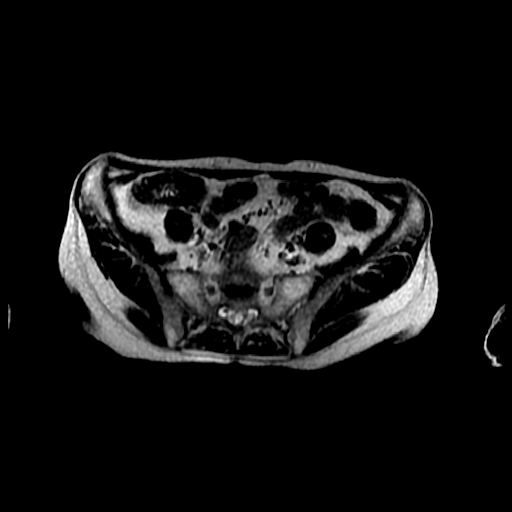
[im 54/54]
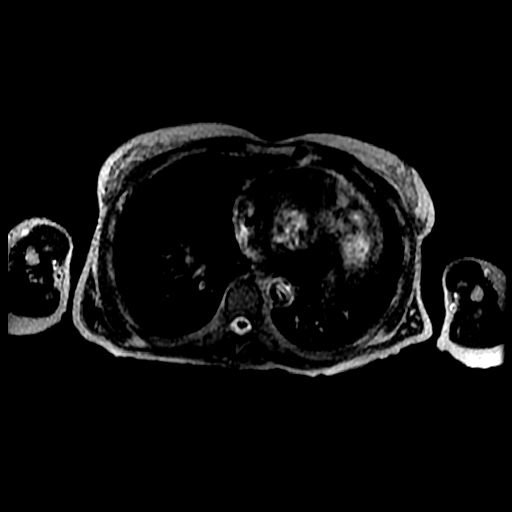

[Series 7: T2 · coronal · 5.0mm · 0.70mm/px · 1 of 36 slices shown (2 of 2)]
[im 1/36]
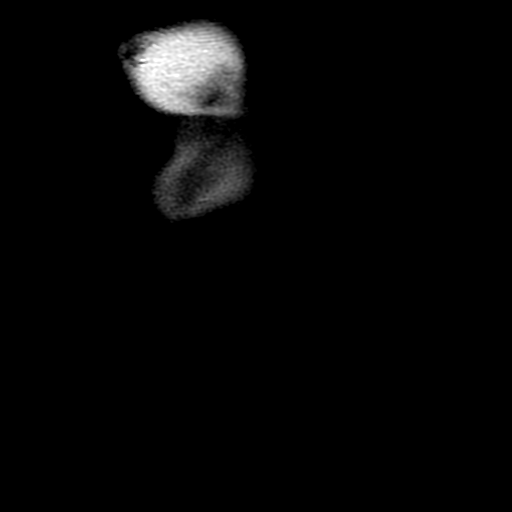

[Series 8: bSSFP · axial · 5.0mm · 0.78mm/px · z∈[-101,+164]mm · 2 of 54 slices shown]
[im 1/54]
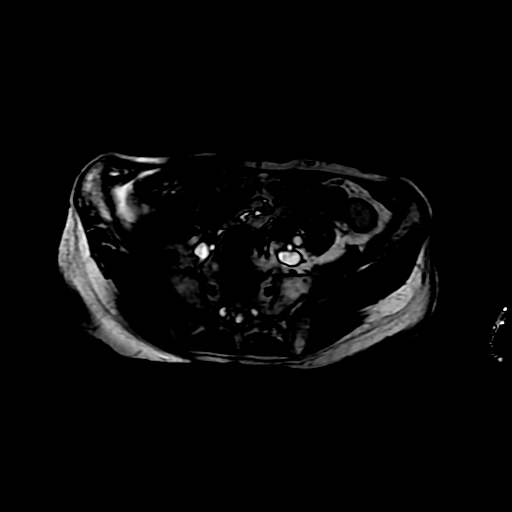
[im 54/54]
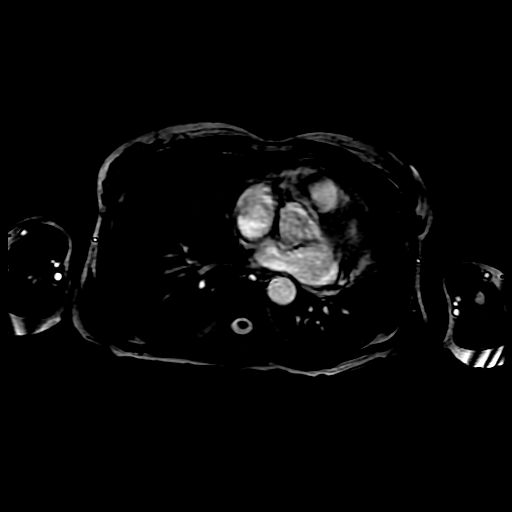

[Series 9: ax dualecho bh · axial · 5.0mm · 0.78mm/px · z∈[-101,+164]mm · 4 of 108 slices shown]
[im 1/108]
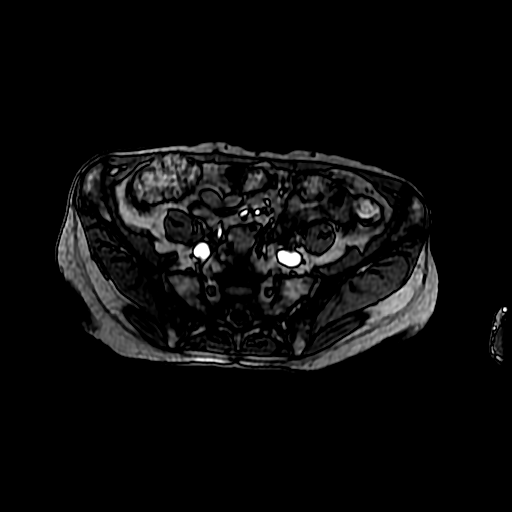
[im 36/108]
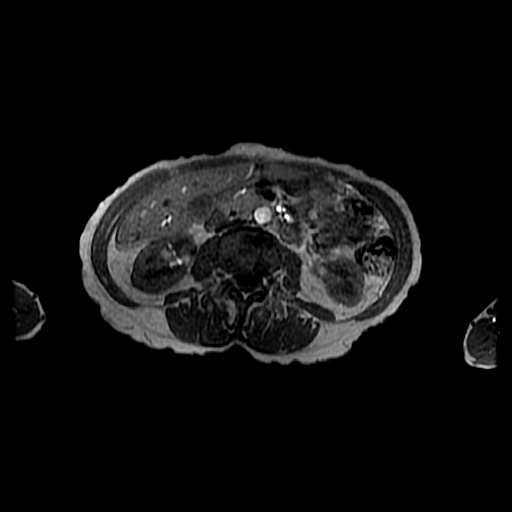
[im 72/108]
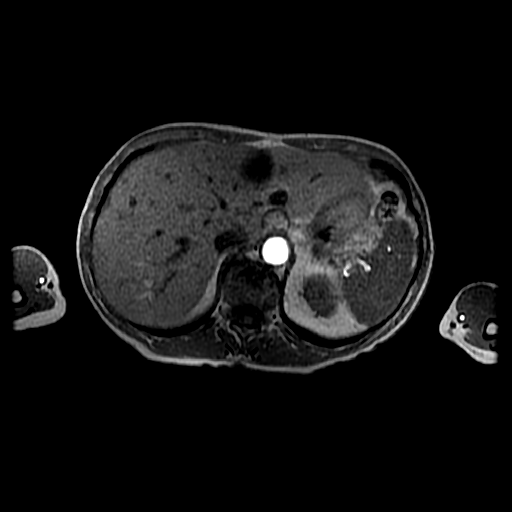
[im 108/108]
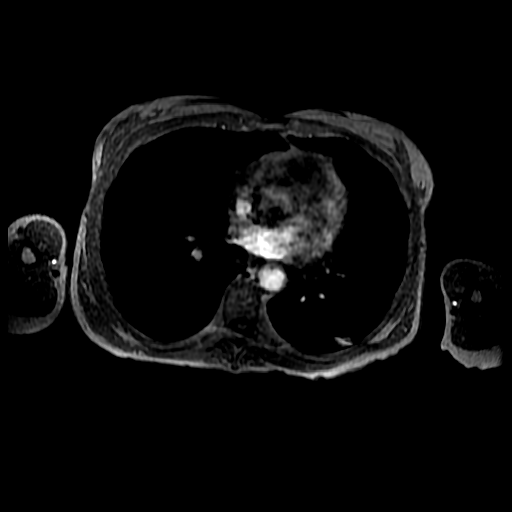

[Series 400: DWI · axial · 6.0mm · 1.48mm/px · 1 of 36 slices shown]
[im 1/36]
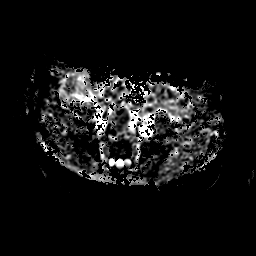

[Series 1000: T1 dynamic · axial · 6.0mm · 0.78mm/px · z∈[-91,+170]mm · 3 of 88 slices shown (1 of 2)]
[im 1/88]
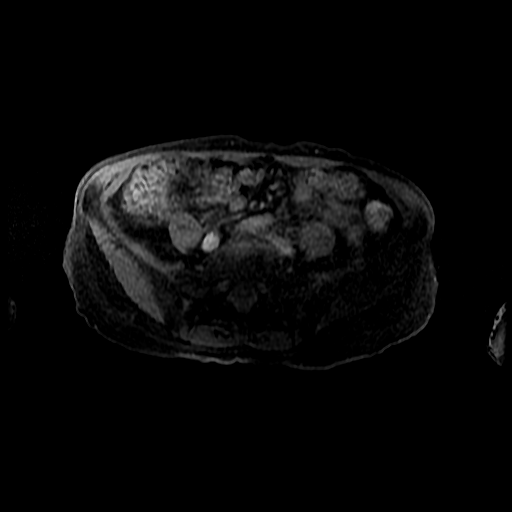
[im 44/88]
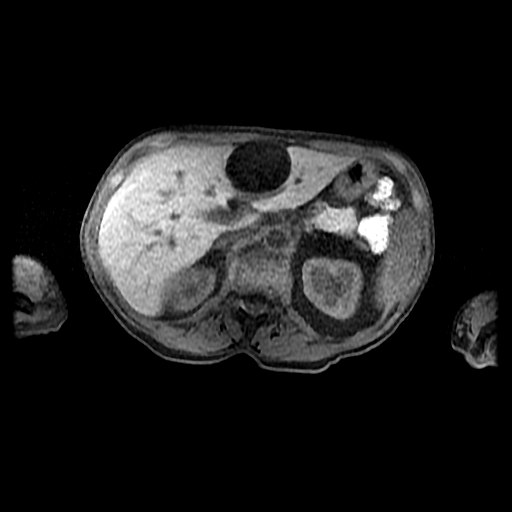
[im 88/88]
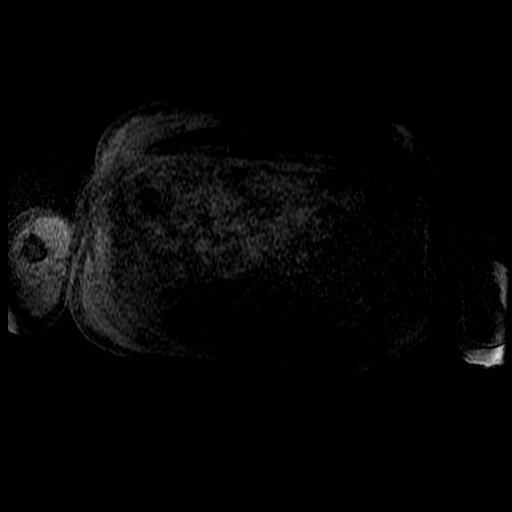

[Series 1001: T1 dynamic · axial · 6.0mm · 0.78mm/px · z∈[-91,+170]mm · 3 of 88 slices shown (2 of 2)]
[im 1/88]
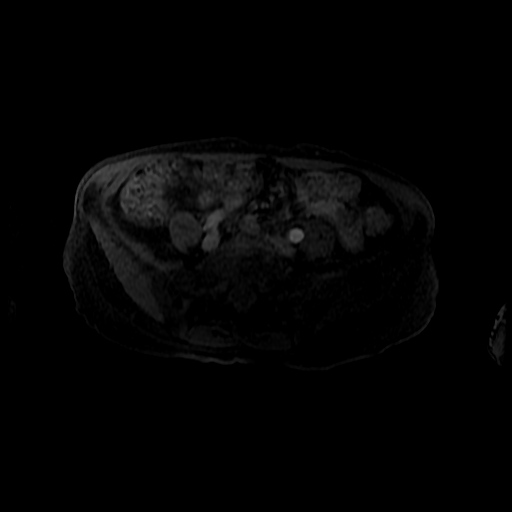
[im 44/88]
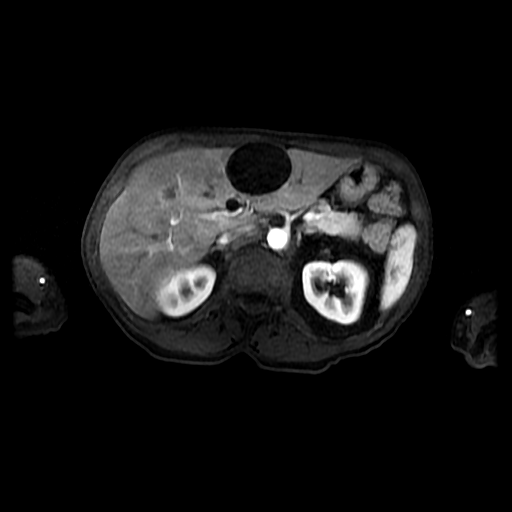
[im 88/88]
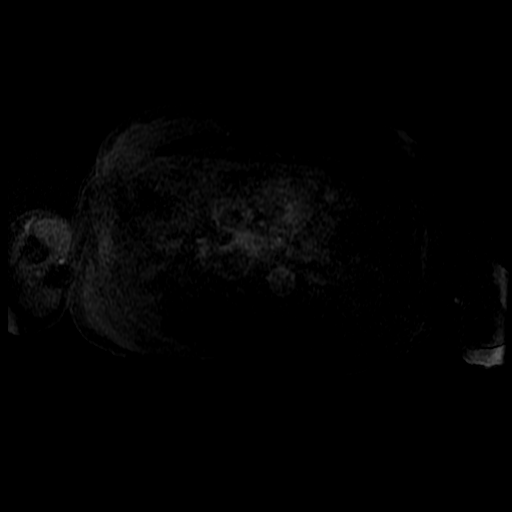

[21 of 48 positions shown; findings below may reference images not displayed]

FINDINGS: Lower chest: Unremarkable

Hepatobiliary: Multiple hepatic cysts are present in some of these
are septated. No appreciable mural nodularity. A 3.8 by 1.6 cm
lesion in segment 8 of the liver has several internal septations.

Inferiorly in the right hepatic lobe a 1.9 by 1.6 cm hemangioma is
present, demonstrating early arterial phase peripheral enhancement
and subsequent delayed phase diffuse enhancement. This lesion has
the characteristic high T2 and low T1 signal characteristics of a
hemangioma.

The gallbladder appears unremarkable.

Pancreas:  Unremarkable

Spleen:  Unremarkable

Adrenals/Urinary Tract:  Unremarkable

Stomach/Bowel: Unremarkable

Vascular/Lymphatic: Aortoiliac atherosclerotic vascular disease. No
adenopathy.

Other:  No supplemental non-categorized findings.

Musculoskeletal: Mild lumbar spondylosis and degenerative disc
disease.
IMPRESSION: 1. Benign hemangioma inferiorly in the right hepatic lobe.
2. Multiple hepatic cysts, some of which are septated. No internal
nodularity. No debris or fluid levels. No wall thickening or
definite daughter cyst appearance. This may be a manifestation of
mild polycystic liver disease. We do not demonstrate the degree of
complexity typically seen in biliary cystadenoma, and surveillance
follow up is considered optional. If elected, I would suggest follow
up CT, MRI, or ultrasound in 12 months time.
3. Suspected atherosclerosis.
4. Mild lumbar spondylosis and degenerative disc disease.

## 2020-07-22 ENCOUNTER — Telehealth: Payer: Self-pay | Admitting: Physician Assistant

## 2020-07-22 DIAGNOSIS — F419 Anxiety disorder, unspecified: Secondary | ICD-10-CM

## 2020-07-22 NOTE — Telephone Encounter (Signed)
..  Medication Refills  Last OV:  Medication:  Xanax - alprazolam .5 Pharmacy:  [facility] on [address] Let patient know to contact pharmacy at the end of the day to make sure medication is ready.   Please notify patient to allow 48-72 hours to process.  Encourage patient to contact the pharmacy for refills or they can request refills through [place] out below:   Last refill:  QTY:  Refill Date:    Other Comments:   Okay for refill?  Please advise.

## 2020-07-22 NOTE — Telephone Encounter (Signed)
Refill request for [facility] Pt

## 2020-07-23 ENCOUNTER — Encounter: Payer: Self-pay | Admitting: Registered Nurse

## 2020-07-23 DIAGNOSIS — F419 Anxiety disorder, unspecified: Secondary | ICD-10-CM

## 2020-07-23 MED ORDER — ALPRAZOLAM 0.5 MG PO TABS: 0.5000 mg | ORAL_TABLET | Freq: Two times a day (BID) | ORAL | 1 refills | Status: DC | PRN

## 2020-07-23 NOTE — Telephone Encounter (Signed)
Patient called back and would like to pick up a prescription for this today.  She is going out of town Friday -   Please call today please.

## 2020-07-23 NOTE — Telephone Encounter (Signed)
Martin Pt  [place] has not responded as of yet.   Patient is requesting a refill of the following medications: Requested Prescriptions   Pending Prescriptions Disp Refills  . ALPRAZolam (XANAX) 0.5 MG tablet 60 tablet 1    Sig: Take 1 tablet (0.5 mg total) by mouth 2 (two) times daily as needed for anxiety.    Date of patient request: 07/22/20 Last office visit: 02/20/2020 Date of last refill: 05/05/20 Last refill amount: 60 1 refill  Follow up time period per chart:  Has TOC 08/10/20

## 2020-07-23 NOTE — Telephone Encounter (Signed)
Provider no longer at this office, she will not be able to pick up a Rx from the office if she is referring to hard copy. I have already sent her request for refill to the covering provider. Must wait on that response.

## 2020-08-04 ENCOUNTER — Other Ambulatory Visit: Payer: Self-pay | Admitting: Physician Assistant

## 2020-08-04 DIAGNOSIS — F32A Depression, unspecified: Secondary | ICD-10-CM

## 2020-08-04 NOTE — Telephone Encounter (Signed)
Pt called in asking for a refill on the Trazodone, pt has a TOC on 08/11/20 with [name].  [facility] uses [facility] on [address]

## 2020-08-04 NOTE — Telephone Encounter (Signed)
Patient is requesting a refill of the following medications: Requested Prescriptions   Pending Prescriptions Disp Refills  . traZODone (DESYREL) 50 MG tablet 90 tablet 1    Sig: Take 1 tablet (50 mg total) by mouth at bedtime as needed for sleep.    Date of patient request: 08/04/2020 Last office visit: 02/19/2021 Date of last refill: 01/23/2020 Last refill amount: 90 + 1refill   TOC from [place] on 08/11/2020 with you.

## 2020-08-05 MED ORDER — TRAZODONE HCL 50 MG PO TABS: 50.0000 mg | ORAL_TABLET | Freq: Every evening | ORAL | 1 refills | Status: DC | PRN

## 2020-08-11 ENCOUNTER — Other Ambulatory Visit: Payer: Self-pay

## 2020-08-11 ENCOUNTER — Ambulatory Visit: Payer: Medicare PPO | Admitting: Physician Assistant

## 2020-08-11 ENCOUNTER — Encounter: Payer: Self-pay | Admitting: Registered Nurse

## 2020-08-11 ENCOUNTER — Ambulatory Visit: Payer: Medicare PPO | Admitting: Registered Nurse

## 2020-08-11 VITALS — BP 135/70 | HR 89 | Temp 98.1°F | Resp 18 | Ht 65.0 in | Wt 115.0 lb

## 2020-08-11 DIAGNOSIS — M8580 Other specified disorders of bone density and structure, unspecified site: Secondary | ICD-10-CM | POA: Diagnosis not present

## 2020-08-11 DIAGNOSIS — R922 Inconclusive mammogram: Secondary | ICD-10-CM

## 2020-08-11 DIAGNOSIS — E7800 Pure hypercholesterolemia, unspecified: Secondary | ICD-10-CM

## 2020-08-11 DIAGNOSIS — F32A Depression, unspecified: Secondary | ICD-10-CM

## 2020-08-11 DIAGNOSIS — R5382 Chronic fatigue, unspecified: Secondary | ICD-10-CM

## 2020-08-11 DIAGNOSIS — F419 Anxiety disorder, unspecified: Secondary | ICD-10-CM | POA: Diagnosis not present

## 2020-08-11 NOTE — Progress Notes (Signed)
Established Patient Office Visit  Subjective:  Patient ID: Nancy Smith, female    DOB: 05-Oct-1955  Age: 65 y.o. MRN: [id number]  CC:  Chief Complaint  Patient presents with  . Transitions Of Care    Patient is here for a TOC. And 6 month follow up.    HPI [name] presents for visit to est care  Mostly wants to discuss her mental health - has had a tough time lately. Brother had emergency heart surgery in January and she has been on call to care for her 78 year old father who lives in [place]. She describes their relationship as strained. Beyond this, she is coping with her changing view of her role as a woman in her own home - sometimes this is leading her to concerns about her expectations of herself.   She is taking sertraline 100mg , bupropion 150 mg XR, alprazolam 0.5mg , and trazodone 50mg  PO qd. Good effect. No AEs. Wants to maintain this so long as it is safe.  Notes she is due for mammography. Unsure of Dexa - thinks it was last year. Will check both and order as fit today.   Past Medical History:  Diagnosis Date  . Colitis    Lymphocytic colitis; had 2 flares in her lifetime  . Hx of appendicitis   . Hyperlipidemia     Past Surgical History:  Procedure Laterality Date  . APPENDECTOMY    . EYE SURGERY  1996  . HERNIA REPAIR  1983   Femoral  . MOHS SURGERY     for Melanoma  . TONSILLECTOMY  1960    Family History  Problem Relation Age of Onset  . Heart failure Mother   . Lung cancer Mother        Smoker  . CAD Mother        s/p bypass  . Osteoporosis Mother   . Heart failure Father   . Hyperlipidemia Father   . Heart disease Father   . Atrial fibrillation Father   . Congestive Heart Failure Brother     Social History   Socioeconomic History  . Marital status: Married    Spouse name: Not on file  . Number of children: Not on file  . Years of education: Not on file  . Highest education level: Master's degree (e.g., MA, MS, MEng, MEd,  MSW, MBA)  Occupational History  . Not on file  Tobacco Use  . Smoking status: Former Smoker    Packs/day: 0.25    Years: 15.00    Pack years: 3.75    Types: Cigarettes    Quit date: 1994    Years since quitting: 28.3  . Smokeless tobacco: Never Used  Vaping Use  . Vaping Use: Never used  Substance and Sexual Activity  . Alcohol use: Yes    Comment: occasionally  . Drug use: Never  . Sexual activity: Not on file  Other Topics Concern  . Not on file  Social History Narrative  . Not on file   Social Determinants of Health   Financial Resource Strain: Low Risk   . Difficulty of Paying Living Expenses: Not hard at all  Food Insecurity: No Food Insecurity  . Worried About Charity fundraiser in the Last Year: Never true  . Ran Out of Food in the Last Year: Never true  Transportation Needs: No Transportation Needs  . Lack of Transportation (Medical): No  . Lack of Transportation (Non-Medical): No  Physical Activity: Sufficiently Active  .  Days of Exercise per Week: 7 days  . Minutes of Exercise per Session: 50 min  Stress: No Stress Concern Present  . Feeling of Stress : Not at all  Social Connections: Socially Integrated  . Frequency of Communication with Friends and Family: More than three times a week  . Frequency of Social Gatherings with Friends and Family: Once a week  . Attends Religious Services: 1 to 4 times per year  . Active Member of Clubs or Organizations: Yes  . Attends Archivist Meetings: 1 to 4 times per year  . Marital Status: Married  Human resources officer Violence: Not At Risk  . Fear of Current or Ex-Partner: No  . Emotionally Abused: No  . Physically Abused: No  . Sexually Abused: No    Outpatient Medications Prior to Visit  Medication Sig Dispense Refill  . ALPRAZolam (XANAX) 0.5 MG tablet Take 1 tablet (0.5 mg total) by mouth 2 (two) times daily as needed for anxiety. 60 tablet 1  . ALPRAZolam (XANAX) 0.5 MG tablet Take 1 tablet (0.5 mg  total) by mouth 2 (two) times daily as needed for anxiety. 60 tablet 1  . budesonide (ENTOCORT EC) 3 MG 24 hr capsule Take by mouth daily.    Marland Kitchen buPROPion (WELLBUTRIN XL) 150 MG 24 hr tablet Take 1 tablet (150 mg total) by mouth daily. 90 tablet 2  . calcium carbonate (TUMS EX) 750 MG chewable tablet Chew 1 tablet by mouth daily.    . cetirizine (ZYRTEC ALLERGY) 10 MG tablet Take 1 tablet (10 mg total) by mouth daily. 30 tablet 3  . omeprazole (PRILOSEC) 20 MG capsule Take 2 capsules (40 mg total) by mouth daily. (Patient taking differently: Take 20 mg by mouth as needed.) 60 capsule 1  . sertraline (ZOLOFT) 100 MG tablet Take 1 tablet (100 mg total) by mouth daily. 90 tablet 2  . traZODone (DESYREL) 50 MG tablet Take 1 tablet (50 mg total) by mouth at bedtime as needed for sleep. 90 tablet 1   No facility-administered medications prior to visit.    Allergies  Allergen Reactions  . Latex   . Strattera [Atomoxetine] Palpitations    ROS Review of Systems  Constitutional: Negative.   HENT: Negative.   Eyes: Negative.   Respiratory: Negative.   Cardiovascular: Negative.   Gastrointestinal: Negative.   Genitourinary: Negative.   Musculoskeletal: Negative.   Skin: Negative.   Neurological: Negative.   Psychiatric/Behavioral: Negative.   All other systems reviewed and are negative.     Objective:    Physical Exam Vitals and nursing note reviewed.  Constitutional:      General: She is not in acute distress.    Appearance: Normal appearance. She is normal weight. She is not ill-appearing, toxic-appearing or diaphoretic.  Cardiovascular:     Rate and Rhythm: Normal rate and regular rhythm.     Heart sounds: Normal heart sounds. No murmur heard. No friction rub. No gallop.   Pulmonary:     Effort: Pulmonary effort is normal. No respiratory distress.     Breath sounds: Normal breath sounds. No stridor. No wheezing, rhonchi or rales.  Chest:     Chest wall: No tenderness.   Skin:    General: Skin is warm and dry.  Neurological:     General: No focal deficit present.     Mental Status: She is alert and oriented to person, place, and time. Mental status is at baseline.  Psychiatric:  Mood and Affect: Mood normal.        Behavior: Behavior normal.        Thought Content: Thought content normal.        Judgment: Judgment normal.     BP 135/70   Pulse 89   Temp 98.1 F (36.7 C) (Temporal)   Resp 18   Ht 5\' 5"  (1.651 m)   Wt 115 lb (52.2 kg)   SpO2 99%   BMI 19.14 kg/m  Wt Readings from Last 3 Encounters:  08/11/20 115 lb (52.2 kg)  02/20/20 121 lb (54.9 kg)  02/13/20 119 lb 9.6 oz (54.3 kg)     There are no preventive care reminders to display for this patient.  There are no preventive care reminders to display for this patient.  Lab Results  Component Value Date   TSH 1.49 12/18/2019   Lab Results  Component Value Date   WBC 4.1 02/20/2020   HGB 12.6 02/20/2020   HCT 37.6 02/20/2020   MCV 88.4 02/20/2020   PLT 214.0 02/20/2020   Lab Results  Component Value Date   NA 138 02/20/2020   K 4.1 02/20/2020   CO2 30 02/20/2020   GLUCOSE 93 02/20/2020   BUN 13 02/20/2020   CREATININE 0.83 02/20/2020   BILITOT 0.5 02/20/2020   ALKPHOS 48 02/20/2020   AST 17 02/20/2020   ALT 10 02/20/2020   PROT 6.1 02/20/2020   ALBUMIN 4.2 02/20/2020   CALCIUM 9.2 02/20/2020   ANIONGAP 8 02/20/2019   GFR 73.50 02/20/2020   Lab Results  Component Value Date   CHOL 208 (H) 02/20/2020   Lab Results  Component Value Date   HDL 84.80 02/20/2020   Lab Results  Component Value Date   LDLCALC 115 (H) 02/20/2020   Lab Results  Component Value Date   TRIG 41.0 02/20/2020   Lab Results  Component Value Date   CHOLHDL 2 02/20/2020   Lab Results  Component Value Date   HGBA1C 5.6 02/20/2020      Assessment & Plan:   Problem List Items Addressed This Visit      Other   Hypercholesterolemia - Primary (Chronic)   Relevant Orders    Lipid panel    Other Visit Diagnoses    Chronic fatigue       Relevant Orders   Comprehensive metabolic panel      No orders of the defined types were placed in this encounter.   Follow-up: No follow-ups on file.   PLAN  Spent much time discussing coping mechanisms and nonpharm for anxiety - she agrees that pursuing an aggressive self care regimen would yield beneficial results. Also agreeable to therapy referral which I can place today  Hx of osteopenia - will repeat dexa  Hx of dense breast tissue - overdue for mammography - order today  Labs collected. Will follow up with the patient as warranted.  Return in 6 mo for CPE / AWV  Patient encouraged to call clinic with any questions, comments, or concerns.  I spent 46 minutes with this patient, more than 50% of which was spent counseling and/or educating.  Maximiano Coss, NP

## 2020-08-11 NOTE — Patient Instructions (Signed)
° ° ° °  If you have lab work done today you will be contacted with your lab results within the next 2 weeks.  If you have not heard from us then please contact us. The fastest way to get your results is to register for My Chart. ° ° °IF you received an x-ray today, you will receive an invoice from [facility]. Please contact [facility] at [phone] with questions or concerns regarding your invoice.  ° °IF you received labwork today, you will receive an invoice from [facility]. Please contact [facility] at [phone] with questions or concerns regarding your invoice.  ° °Our billing staff will not be able to assist you with questions regarding bills from these companies. ° °You will be contacted with the lab results as soon as they are available. The fastest way to get your results is to activate your My Chart account. Instructions are located on the last page of this paperwork. If you have not heard from us regarding the results in 2 weeks, please contact this office. °  ° ° ° °

## 2020-08-12 LAB — LIPID PANEL
Cholesterol: 195 mg/dL (ref 0–200)
HDL: 69.8 mg/dL (ref >39.00)
LDL Cholesterol: 111 mg/dL — ABNORMAL HIGH (ref 0–99)
NonHDL: 125.02
Total CHOL/HDL Ratio: 3
Triglycerides: 69 mg/dL (ref 0.0–149.0)
VLDL: 13.8 mg/dL (ref 0.0–40.0)

## 2020-08-12 LAB — COMPREHENSIVE METABOLIC PANEL
ALT: 11 U/L (ref 0–35)
AST: 17 U/L (ref 0–37)
Albumin: 4.3 g/dL (ref 3.5–5.2)
Alkaline Phosphatase: 53 U/L (ref 39–117)
BUN: 11 mg/dL (ref 6–23)
CO2: 27 mEq/L (ref 19–32)
Calcium: 9.4 mg/dL (ref 8.4–10.5)
Chloride: 101 mEq/L (ref 96–112)
Creatinine, Ser: 0.78 mg/dL (ref 0.40–1.20)
GFR: 78.93 mL/min (ref >60.00)
Glucose, Bld: 86 mg/dL (ref 70–99)
Potassium: 3.9 mEq/L (ref 3.5–5.1)
Sodium: 138 mEq/L (ref 135–145)
Total Bilirubin: 0.4 mg/dL (ref 0.2–1.2)
Total Protein: 6.5 g/dL (ref 6.0–8.3)

## 2020-08-13 ENCOUNTER — Other Ambulatory Visit: Payer: Self-pay

## 2020-08-13 ENCOUNTER — Telehealth: Payer: Self-pay | Admitting: Registered Nurse

## 2020-08-13 DIAGNOSIS — F339 Major depressive disorder, recurrent, unspecified: Secondary | ICD-10-CM

## 2020-08-13 MED ORDER — BUPROPION HCL ER (XL) 150 MG PO TB24: 150.0000 mg | ORAL_TABLET | Freq: Every day | ORAL | 0 refills | Status: DC

## 2020-08-13 NOTE — Telephone Encounter (Signed)
Pt called in asking for a refill on the Bupropion she uses the [facility] on [address]. She saw [name] for a TOC on 08/11/20   Please advise

## 2020-08-13 NOTE — Telephone Encounter (Signed)
Medication sent to pharmacy  

## 2020-09-18 ENCOUNTER — Other Ambulatory Visit: Payer: Self-pay

## 2020-09-18 ENCOUNTER — Ambulatory Visit
Admission: RE | Admit: 2020-09-18 | Discharge: 2020-09-18 | Disposition: A | Discharge status: Home / Self Care | Payer: Medicare PPO | Source: Ambulatory Visit | Attending: Registered Nurse | Admitting: Registered Nurse

## 2020-09-18 DIAGNOSIS — Z1231 Encounter for screening mammogram for malignant neoplasm of breast: Secondary | ICD-10-CM | POA: Diagnosis not present

## 2020-09-18 DIAGNOSIS — R922 Inconclusive mammogram: Secondary | ICD-10-CM

## 2020-09-24 DIAGNOSIS — M1812 Unilateral primary osteoarthritis of first carpometacarpal joint, left hand: Secondary | ICD-10-CM | POA: Diagnosis not present

## 2020-10-05 ENCOUNTER — Telehealth: Payer: Self-pay

## 2020-10-05 NOTE — Telephone Encounter (Signed)
Nancy Smith sent one through the same day she only pick one RX up and wanted this to be added to her file?  Pt call back [phone]

## 2020-10-05 NOTE — Telephone Encounter (Signed)
Pt needs refill on ALPRAZolam (XANAX) 0.5 MG tablet [id number] - [place], [place] - [place] AT [address]  Pt is concerned because [name] Marrow and

## 2020-10-07 ENCOUNTER — Other Ambulatory Visit: Payer: Self-pay

## 2020-10-07 DIAGNOSIS — F419 Anxiety disorder, unspecified: Secondary | ICD-10-CM

## 2020-10-07 MED ORDER — ALPRAZOLAM 0.5 MG PO TABS: 0.5000 mg | ORAL_TABLET | Freq: Two times a day (BID) | ORAL | 1 refills | Status: DC | PRN

## 2020-10-07 NOTE — Telephone Encounter (Signed)
Patient called in again about her alprazolam - Patient is going out of town onFriday so she needs this filled before then.  Patient apologized about the late notice - [facility] on [address]  LFD 07/23/20 #60 with 1 refill LOV 08/11/20 NOV 02/11/21

## 2020-10-07 NOTE — Telephone Encounter (Signed)
Sent to pcp for approval 

## 2020-10-07 NOTE — Telephone Encounter (Signed)
Patient called in again about her alprazolam - Patient is going out of town onFriday so she needs this filled before then.  Patient apologized about the late notice - [place] on [address]

## 2020-10-08 DIAGNOSIS — Z85828 Personal history of other malignant neoplasm of skin: Secondary | ICD-10-CM | POA: Diagnosis not present

## 2020-10-08 DIAGNOSIS — L821 Other seborrheic keratosis: Secondary | ICD-10-CM | POA: Diagnosis not present

## 2020-10-15 DIAGNOSIS — M1812 Unilateral primary osteoarthritis of first carpometacarpal joint, left hand: Secondary | ICD-10-CM | POA: Diagnosis not present

## 2020-10-28 ENCOUNTER — Other Ambulatory Visit: Payer: Self-pay | Admitting: Registered Nurse

## 2020-10-28 DIAGNOSIS — F339 Major depressive disorder, recurrent, unspecified: Secondary | ICD-10-CM

## 2020-11-04 DIAGNOSIS — M65872 Other synovitis and tenosynovitis, left ankle and foot: Secondary | ICD-10-CM | POA: Diagnosis not present

## 2020-11-04 DIAGNOSIS — Q6631 Other congenital varus deformities of feet, right foot: Secondary | ICD-10-CM | POA: Diagnosis not present

## 2020-11-04 DIAGNOSIS — Q6632 Other congenital varus deformities of feet, left foot: Secondary | ICD-10-CM | POA: Diagnosis not present

## 2020-11-04 DIAGNOSIS — M79671 Pain in right foot: Secondary | ICD-10-CM | POA: Diagnosis not present

## 2020-11-04 DIAGNOSIS — M79672 Pain in left foot: Secondary | ICD-10-CM | POA: Diagnosis not present

## 2020-11-04 DIAGNOSIS — M65871 Other synovitis and tenosynovitis, right ankle and foot: Secondary | ICD-10-CM | POA: Diagnosis not present

## 2020-11-10 ENCOUNTER — Ambulatory Visit: Payer: Medicare PPO | Admitting: Podiatry

## 2020-12-03 ENCOUNTER — Other Ambulatory Visit: Payer: Self-pay

## 2020-12-03 ENCOUNTER — Telehealth: Payer: Self-pay | Admitting: Registered Nurse

## 2020-12-03 DIAGNOSIS — F32A Depression, unspecified: Secondary | ICD-10-CM

## 2020-12-03 DIAGNOSIS — F419 Anxiety disorder, unspecified: Secondary | ICD-10-CM

## 2020-12-03 MED ORDER — ALPRAZOLAM 0.5 MG PO TABS: 0.5000 mg | ORAL_TABLET | Freq: Two times a day (BID) | ORAL | 1 refills | Status: DC | PRN

## 2020-12-03 NOTE — Telephone Encounter (Signed)
Patient needs her alprazolam refilled - [facility] on [address].

## 2020-12-03 NOTE — Telephone Encounter (Signed)
Note reviewed from May 3rd, 56-monthfollow-up planned.Controlled substance database reviewed.  Last alprazolam 0.5 mg #60 filled on July 26.  Previous prescription June 29, May 25, April 14, March 7.  Refill ordered.

## 2020-12-03 NOTE — Telephone Encounter (Signed)
Patient needs her alprazolam refilled - [facility] on [address]. LFD 10/07/20 #60 with 1 refill LOV 08/11/20 NOV 12/11/20

## 2020-12-03 NOTE — Telephone Encounter (Signed)
Request sent to provider for approval.  

## 2020-12-11 ENCOUNTER — Other Ambulatory Visit: Payer: Self-pay

## 2020-12-11 ENCOUNTER — Encounter: Payer: Self-pay | Admitting: Registered Nurse

## 2020-12-11 ENCOUNTER — Ambulatory Visit: Payer: Medicare PPO | Admitting: Registered Nurse

## 2020-12-11 VITALS — BP 110/70 | HR 98 | Temp 96.2°F | Resp 20 | Ht 65.0 in | Wt 116.4 lb

## 2020-12-11 DIAGNOSIS — J302 Other seasonal allergic rhinitis: Secondary | ICD-10-CM | POA: Diagnosis not present

## 2020-12-11 DIAGNOSIS — K219 Gastro-esophageal reflux disease without esophagitis: Secondary | ICD-10-CM | POA: Diagnosis not present

## 2020-12-11 DIAGNOSIS — M79641 Pain in right hand: Secondary | ICD-10-CM

## 2020-12-11 DIAGNOSIS — M79642 Pain in left hand: Secondary | ICD-10-CM | POA: Diagnosis not present

## 2020-12-11 DIAGNOSIS — F339 Major depressive disorder, recurrent, unspecified: Secondary | ICD-10-CM | POA: Diagnosis not present

## 2020-12-11 MED ORDER — CETIRIZINE HCL 10 MG PO TABS: 10.0000 mg | ORAL_TABLET | Freq: Every day | ORAL | 3 refills | Status: DC

## 2020-12-11 MED ORDER — OMEPRAZOLE 20 MG PO CPDR: 40.0000 mg | DELAYED_RELEASE_CAPSULE | Freq: Every day | ORAL | 1 refills | Status: DC

## 2020-12-11 MED ORDER — BUPROPION HCL ER (XL) 300 MG PO TB24: 300.0000 mg | ORAL_TABLET | Freq: Every day | ORAL | 0 refills | Status: DC

## 2020-12-11 MED ORDER — SUCRALFATE 1 GM/10ML PO SUSP: 1.0000 g | Freq: Three times a day (TID) | ORAL | 0 refills | Status: DC

## 2020-12-11 MED ORDER — MELOXICAM 7.5 MG PO TABS: 7.5000 mg | ORAL_TABLET | Freq: Every day | ORAL | 3 refills | Status: DC

## 2020-12-11 NOTE — Patient Instructions (Addendum)
Ms. Thammavong -   In brief   Omeprazole - take '20mg'$  twice daily for 2 weeks Sucralfate - take 4 times daily for 10 days Bupropion - increase to '300mg'$  daily. I have sent this Meloxicam - refilled Cetirizine - refilled   I'll touch base in a couple weeks to see how you're doing  Thank you  Denice Paradise

## 2020-12-11 NOTE — Progress Notes (Signed)
Established Patient Office Visit  Subjective:  Patient ID: Nancy Smith, female    DOB: 04-Dec-1955  Age: 65 y.o. MRN: [id number]  CC:  Chief Complaint  Patient presents with   Gastroesophageal Reflux    HPI Nancy Smith presents for med check  GERD: Occasional use of omeprazole '20mg'$  po bid ac PRN Good effect, no AE. Able to identify trigger foods and plan accordingly.  Cough Ongoing for years No clear pattern Hears rattling at times, feels like it could be productive Sometimes apparent food coughed up Normal egd in Jan 2020 Ongoing GERD No dysphagia Occasional hoarseness Not sure of bad taste in mouth as she brushes her teeth 5-6 times daily.   Seasonal Allergies Takes cetirizine '10mg'$  po qd  Good effect, no AE Needs refill  Cholesterol Wants to review labs  Bilateral hand pain Arthritis - oa through both hands Managed through hand specialist in the past Pain waxes and wanes -  Uses meloxicam 7.5 mg po qd with good effect, no AE  Mental Health Concern she has plateaued after starting wellbutrin Still on sertraline '100mg'$  po qd Notes her husband seems to be going through depression and dementia, having a tough time with him being more emotionally labile / angry Still using alprazolam 0.'5mg'$  po bid. 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 - consulted with maxillofacial surgeon Another suspicion was potential esophageal stricture Feels like there's some production - hears rattling with cough   Past Medical History:  Diagnosis Date   Colitis    Lymphocytic colitis; had 2 flares in her lifetime   Hx of appendicitis     Hyperlipidemia     Past Surgical History:  Procedure Laterality Date   [place]   Femoral   MOHS SURGERY     for Melanoma   TONSILLECTOMY  1960    Family History  Problem Relation Age of Onset   Heart failure Mother    Lung cancer Mother        Smoker   CAD Mother        s/p bypass   Osteoporosis Mother    Heart failure Father    Hyperlipidemia Father    Heart disease Father    Atrial fibrillation Father    Congestive Heart Failure Brother     Social History   Socioeconomic History   Marital status: Married    Spouse name: Not on file   Number of children: Not on file   Years of education: Not on file   Highest education level: Master's degree (e.g., MA, MS, MEng, MEd, MSW, MBA)  Occupational History   Not on file  Tobacco Use   Smoking status: Former    Packs/day: 0.25    Years: 15.00    Pack years: 3.75    Types: Cigarettes    Quit date: 1994    Years since quitting: 28.6   Smokeless tobacco: Never  Vaping Use   Vaping Use: Never used  Substance and Sexual Activity   Alcohol use: Yes    Comment: occasionally   Drug use:  Never   Sexual activity: Not on file  Other Topics Concern   Not on file  Social History Narrative   Not on file   Social Determinants of Health   Financial Resource Strain: Low Risk    Difficulty of Paying Living Expenses: Not hard at all  Food Insecurity: No Food Insecurity   Worried About [place] in the Last Year: Never true   Nancy Smith in the Last Year: Never true  Transportation Needs: No Transportation Needs   Lack of Transportation (Medical): No   Lack of Transportation (Non-Medical): No  Physical Activity: Sufficiently Active   Days of Exercise per Week: 7 days   Minutes of Exercise per Session: 50 min  Stress: No Stress Concern Present   Feeling of Stress : Not at all  Social Connections: Socially Integrated   Frequency of Communication with Friends  and Family: More than three times a week   Frequency of Social Gatherings with Friends and Family: Once a week   Attends Religious Services: 1 to 4 times per year   Active Member of [employer] or Organizations: Yes   Attends Archivist Meetings: 1 to 4 times per year   Marital Status: Married  Human resources officer Violence: Not At Risk   Fear of Current or Ex-Partner: No   Emotionally Abused: No   Physically Abused: No   Sexually Abused: No    Outpatient Medications Prior to Visit  Medication Sig Dispense Refill   ALPRAZolam (XANAX) 0.5 MG tablet Take 1 tablet (0.5 mg total) by mouth 2 (two) times daily as needed for anxiety. 60 tablet 1   budesonide (ENTOCORT EC) 3 MG 24 hr capsule Take by mouth daily.     calcium carbonate (TUMS EX) 750 MG chewable tablet Chew 1 tablet by mouth daily.     sertraline (ZOLOFT) 100 MG tablet Take 1 tablet (100 mg total) by mouth daily. 90 tablet 2   traZODone (DESYREL) 50 MG tablet Take 1 tablet (50 mg total) by mouth at bedtime as needed for sleep. 90 tablet 1   buPROPion (WELLBUTRIN XL) 150 MG 24 hr tablet TAKE 1 TABLET(150 MG) BY MOUTH DAILY 90 tablet 0   cetirizine (ZYRTEC ALLERGY) 10 MG tablet Take 1 tablet (10 mg total) by mouth daily. 30 tablet 3   omeprazole (PRILOSEC) 20 MG capsule Take 2 capsules (40 mg total) by mouth daily. (Patient taking differently: Take 20 mg by mouth as needed.) 60 capsule 1   No facility-administered medications prior to visit.    Allergies  Allergen Reactions   Latex    Strattera [Atomoxetine] Palpitations    ROS Review of Systems Per hpi   Objective:    Physical Exam Vitals and nursing note reviewed.  Constitutional:      General: She is not in acute distress.    Appearance: Normal appearance. She is normal weight. She is not ill-appearing, toxic-appearing or diaphoretic.  Cardiovascular:     Rate and Rhythm: Normal rate and regular rhythm.     Heart sounds: Normal heart sounds. No murmur heard.   No  friction rub. No gallop.  Pulmonary:     Effort: Pulmonary effort is normal. No respiratory distress.     Breath sounds: Normal breath sounds. No stridor. No wheezing, rhonchi or rales.  Chest:     Chest wall: No tenderness.  Skin:    General: Skin is warm and dry.  Neurological:     General: No focal deficit  present.     Mental Status: She is alert and oriented to person, place, and time. Mental status is at baseline.  Psychiatric:        Mood and Affect: Mood normal.        Behavior: Behavior normal.        Thought Content: Thought content normal.        Judgment: Judgment normal.    BP 110/70   Pulse 98   Temp (!) 96.2 F (35.7 C) (Temporal)   Resp 20   Ht '5\' 5"'$  (1.651 m)   Wt 116 lb 6.4 oz (52.8 kg)   SpO2 98%   BMI 19.37 kg/m  Wt Readings from Last 3 Encounters:  12/11/20 116 lb 6.4 oz (52.8 kg)  08/11/20 115 lb (52.2 kg)  02/20/20 121 lb (54.9 kg)     Health Maintenance Due  Topic Date Due   COVID-19 Vaccine (4 - Booster for Pfizer series) 05/10/2020   TETANUS/TDAP  10/20/2020   INFLUENZA VACCINE  11/09/2020    There are no preventive care reminders to display for this patient.  Lab Results  Component Value Date   TSH 1.49 12/18/2019   Lab Results  Component Value Date   WBC 4.1 02/20/2020   HGB 12.6 02/20/2020   HCT 37.6 02/20/2020   MCV 88.4 02/20/2020   PLT 214.0 02/20/2020   Lab Results  Component Value Date   NA 138 08/11/2020   K 3.9 08/11/2020   CO2 27 08/11/2020   GLUCOSE 86 08/11/2020   BUN 11 08/11/2020   CREATININE 0.78 08/11/2020   BILITOT 0.4 08/11/2020   ALKPHOS 53 08/11/2020   AST 17 08/11/2020   ALT 11 08/11/2020   PROT 6.5 08/11/2020   ALBUMIN 4.3 08/11/2020   CALCIUM 9.4 08/11/2020   ANIONGAP 8 02/20/2019   GFR 78.93 08/11/2020   Lab Results  Component Value Date   CHOL 195 08/11/2020   Lab Results  Component Value Date   HDL 69.80 08/11/2020   Lab Results  Component Value Date   LDLCALC 111 (H) 08/11/2020    Lab Results  Component Value Date   TRIG 69.0 08/11/2020   Lab Results  Component Value Date   CHOLHDL 3 08/11/2020   Lab Results  Component Value Date   HGBA1C 5.6 02/20/2020      Assessment & Plan:   Problem List Items Addressed This Visit       Digestive   Gastroesophageal reflux disease (Chronic)   Relevant Medications   omeprazole (PRILOSEC) 20 MG capsule   sucralfate (CARAFATE) 1 GM/10ML suspension   Other Visit Diagnoses     Seasonal allergies    -  Primary   Relevant Medications   cetirizine (ZYRTEC ALLERGY) 10 MG tablet   Bilateral hand pain       Relevant Medications   meloxicam (MOBIC) 7.5 MG tablet   Depression, recurrent (HCC)       Relevant Medications   buPROPion (WELLBUTRIN XL) 300 MG 24 hr tablet       Meds ordered this encounter  Medications   cetirizine (ZYRTEC ALLERGY) 10 MG tablet    Sig: Take 1 tablet (10 mg total) by mouth daily.    Dispense:  90 tablet    Refill:  3   omeprazole (PRILOSEC) 20 MG capsule    Sig: Take 2 capsules (40 mg total) by mouth daily.    Dispense:  90 capsule    Refill:  1   meloxicam (MOBIC) 7.5  MG tablet    Sig: Take 1 tablet (7.5 mg total) by mouth daily.    Dispense:  30 tablet    Refill:  3    Order Specific Question:   Supervising Provider    Answer:   Carlota Raspberry, JEFFREY R [[id number]]   sucralfate (CARAFATE) 1 GM/10ML suspension    Sig: Take 10 mLs (1 g total) by mouth 4 (four) times daily -  with meals and at bedtime.    Dispense:  420 mL    Refill:  0    Order Specific Question:   Supervising Provider    Answer:   Carlota Raspberry, JEFFREY R [[id number]]   buPROPion (WELLBUTRIN XL) 300 MG 24 hr tablet    Sig: Take 1 tablet (300 mg total) by mouth daily.    Dispense:  90 tablet    Refill:  0    Order Specific Question:   Supervising Provider    Answer:   Carlota Raspberry, JEFFREY R [[id number]]    Follow-up: Return if symptoms worsen or fail to improve, for as scheduled.   PLAN Refill meloxicam for hand pain Increase  wellbutrin to '300mg'$  XL po qd Omeprazole - take '20mg'$  po bid for two weeks. Add sucralfate for ten days. Monitor cough, suspect this is from the GERD given her hx of normal chest imaging and apparent regurgitation at times with cough. Refill cetirizine for allergies Reviewed last labs. Lipid panel wnl save LDL at 114. Not concerning. Continue monitoring lifestyle Return as scheduled in early November Patient encouraged to call clinic with any questions, comments, or concerns.  I spent 45  minutes with this patient reviewing medications, refilling meds, coordinating care for acute issues, and monitoring health maintenance  Maximiano Coss, NP

## 2020-12-15 ENCOUNTER — Encounter: Payer: Self-pay | Admitting: Registered Nurse

## 2021-01-04 NOTE — Telephone Encounter (Signed)
Approved  Rich

## 2021-01-28 ENCOUNTER — Other Ambulatory Visit: Payer: Self-pay | Admitting: Registered Nurse

## 2021-01-28 ENCOUNTER — Other Ambulatory Visit: Payer: Self-pay

## 2021-01-28 DIAGNOSIS — F419 Anxiety disorder, unspecified: Secondary | ICD-10-CM

## 2021-01-28 DIAGNOSIS — F32A Depression, unspecified: Secondary | ICD-10-CM

## 2021-01-28 NOTE — Telephone Encounter (Signed)
Patient is requesting a refill of the following medications: Requested Prescriptions   Pending Prescriptions Disp Refills   traZODone (DESYREL) 50 MG tablet [Pharmacy Med Name: TRAZODONE 50MG  TABLETS] 90 tablet 1    Sig: TAKE 1 TABLET(50 MG) BY MOUTH AT BEDTIME AS NEEDED FOR SLEEP    Date of patient request: 01/28/2021 Last office visit: 12/11/2020 Date of last refill: 08/05/2020 Last refill amount: 90 tablets 1 refills Follow up time period per chart: 03/09/2021

## 2021-01-28 NOTE — Telephone Encounter (Signed)
Caller name:Issis Earleen Aoun callback [phone]  Encourage patient to contact the pharmacy for refills or they can request refills through [facility]  (Please schedule appointment if patient has not been seen in over a year)  MEDICATION NAME & DOSE:sertraline (ZOLOFT) 100 MG tablet ,ALPRAZolam (XANAX) 0.5 MG tablet ,meloxicam (MOBIC) 7.5 MG tablet,   Notes/Comments from patient:Pt only has one pill left   WHAT PHARMACY WOULD THEY LIKE THIS SENT TO: [place], [place] - [address] AT [address]   Please notify patient: It takes 48-72 hours to process rx refill requests Ask patient to call pharmacy to ensure rx is ready before heading there.   (CLINICAL TO FILL OR ROUTE PER PROTOCOLS)

## 2021-01-29 ENCOUNTER — Other Ambulatory Visit: Payer: Self-pay

## 2021-01-29 DIAGNOSIS — F32A Depression, unspecified: Secondary | ICD-10-CM

## 2021-01-29 DIAGNOSIS — F419 Anxiety disorder, unspecified: Secondary | ICD-10-CM

## 2021-01-29 MED ORDER — ALPRAZOLAM 0.5 MG PO TABS: 0.5000 mg | ORAL_TABLET | Freq: Two times a day (BID) | ORAL | 1 refills | Status: DC | PRN

## 2021-01-29 MED ORDER — SERTRALINE HCL 100 MG PO TABS: 100.0000 mg | ORAL_TABLET | Freq: Every day | ORAL | 2 refills | Status: DC

## 2021-01-29 NOTE — Telephone Encounter (Signed)
Requesting: Xanax Contract: UDS: Last Visit:12/11/20 Next Visit:02/17/21 Last Refill:12/03/20 60 tabs 1 refill

## 2021-01-31 NOTE — Telephone Encounter (Signed)
This concern has been previously addressed by myself and/or another provider.  If they patient has ongoing concerns, they can contact me at their convenience.  Thank you,  Rich Skylan [name], NP 

## 2021-02-03 ENCOUNTER — Encounter: Payer: Self-pay | Admitting: Family Medicine

## 2021-02-03 ENCOUNTER — Telehealth (INDEPENDENT_AMBULATORY_CARE_PROVIDER_SITE_OTHER): Payer: Medicare PPO | Admitting: Family Medicine

## 2021-02-03 VITALS — Temp 99.4°F

## 2021-02-03 DIAGNOSIS — J22 Unspecified acute lower respiratory infection: Secondary | ICD-10-CM | POA: Diagnosis not present

## 2021-02-03 DIAGNOSIS — R051 Acute cough: Secondary | ICD-10-CM | POA: Diagnosis not present

## 2021-02-03 MED ORDER — AZITHROMYCIN 250 MG PO TABS: ORAL_TABLET | ORAL | 0 refills | Status: DC

## 2021-02-03 MED ORDER — BENZONATATE 100 MG PO CAPS
100.0000 mg | ORAL_CAPSULE | Freq: Three times a day (TID) | ORAL | 0 refills | Status: DC | PRN
Start: 2021-02-03 — End: 2021-08-25

## 2021-02-03 NOTE — Progress Notes (Signed)
Virtual Visit via Video Note  I connected with Nancy Smith on 02/03/21 at 12:26 PM by a video enabled telemedicine application and verified that I am speaking with the correct person using two identifiers.  Patient location: home - by self.  My location: office -[place].    I discussed the limitations, risks, security and privacy concerns of performing an evaluation and management service by telephone and the availability of in person appointments. I also discussed with the patient that there may be a patient responsible charge related to this service. The patient expressed understanding and agreed to proceed, consent obtained  Chief complaint:  Chief Complaint  Patient presents with   Nasal Congestion    Pt reports head and chest congestion, headache, horseness and sore throat, fever 100.7 fever yesterday, husband has had this for about 3 weeks now, has tried COVID testing for her and her husband reports both negative     History of Present Illness: Nancy Smith is a 65 y.o. female  Congestion: Nasal congestion, runny nose, eyes, headache,  face pressure/pain. Started 4-5 days ago. Some discolored nasal discharge. Pain behind eyes, frontal HA on right and to below eye.  Temp 100.5 yesterday.  Negative covid test this am.  No chest pain, drinking fluids.  Croupy cough started overnight. More chest congestion.  No dyspnea.  Plan to see elderly father in 5 days.   Tx: zyrtec, nyquil, honey, cough drops.   UTD on flu vaccine and covid vaccinations.   Husband has been sick for a few weeks. Congestion, chest congestion. He has had multiple negative covid tests.  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   Osteopenia 09/09/2019   Dense breast tissue on mammogram 09/09/2019   Hypercholesterolemia 09/09/2019   History of colon polyps  09/09/2019   Hemangioma of liver 09/09/2019   Hepatic cyst 09/09/2019   History of posterior vitreous detachment 09/09/2019   Age-related cataract of both eyes 09/09/2019   Anxiety and depression 09/02/2019   Gastroesophageal reflux disease 09/02/2019   Past Medical History:  Diagnosis Date   Colitis    Lymphocytic colitis; had 2 flares in her lifetime   Hx of appendicitis    Hyperlipidemia    Past Surgical History:  Procedure Laterality Date   [place]   Femoral   MOHS SURGERY     for Melanoma   TONSILLECTOMY  1960   Allergies  Allergen Reactions   Latex    Strattera [Atomoxetine] Palpitations   Prior to Admission medications   Medication Sig Start Date End Date Taking? Authorizing Provider  ALPRAZolam Duanne Moron) 0.5 MG tablet Take 1 tablet (0.5 mg total) by mouth 2 (two) times daily as needed for anxiety. 01/29/21  Yes Maximiano Coss, NP  budesonide (ENTOCORT EC) 3 MG 24 hr capsule Take by mouth daily.   Yes [provider]  buPROPion (WELLBUTRIN XL) 300 MG 24 hr tablet Take 1 tablet (300 mg total) by mouth daily. 12/11/20  Yes Maximiano Coss, NP  calcium carbonate (TUMS EX) 750 MG chewable tablet Chew 1 tablet by mouth daily.   Yes [provider]  cetirizine (ZYRTEC ALLERGY) 10 MG tablet Take 1 tablet (10 mg total) by mouth daily. 12/11/20  Yes Maximiano Coss, NP  meloxicam (MOBIC) 7.5 MG tablet Take 1  tablet (7.5 mg total) by mouth daily. 12/11/20  Yes Maximiano Coss, NP  omeprazole (PRILOSEC) 20 MG capsule Take 2 capsules (40 mg total) by mouth daily. 12/11/20  Yes Maximiano Coss, NP  sertraline (ZOLOFT) 100 MG tablet Take 1 tablet (100 mg total) by mouth daily. 01/29/21  Yes Maximiano Coss, NP  sucralfate (CARAFATE) 1 GM/10ML suspension Take 10 mLs (1 g total) by mouth 4 (four) times daily -  with meals and at bedtime. 12/11/20  Yes Maximiano Coss, NP  traZODone (DESYREL) 50 MG tablet TAKE 1 TABLET(50 MG) BY  MOUTH AT BEDTIME AS NEEDED FOR SLEEP 01/28/21  Yes Maximiano Coss, NP   Social History   Socioeconomic History   Marital status: Married    Spouse name: Not on file   Number of children: Not on file   Years of education: Not on file   Highest education level: Master's degree (e.g., MA, MS, MEng, MEd, MSW, MBA)  Occupational History   Not on file  Tobacco Use   Smoking status: Former    Packs/day: 0.25    Years: 15.00    Pack years: 3.75    Types: Cigarettes    Quit date: 70    Years since quitting: 28.8   Smokeless tobacco: Never  Vaping Use   Vaping Use: Never used  Substance and Sexual Activity   Alcohol use: Yes    Comment: occasionally   Drug use: Never   Sexual activity: Not on file  Other Topics Concern   Not on file  Social History Narrative   Not on file   Social Determinants of Health   Financial Resource Strain: Not on file  Food Insecurity: Not on file  Transportation Needs: Not on file  Physical Activity: Not on file  Stress: Not on file  Social Connections: Not on file  Intimate Partner Violence: Not on file    Observations/Objective: Vitals:   02/03/21 1140  Temp: 99.4 F (37.4 C)  TempSrc: Temporal  Nontoxic appearance on video, no respiratory distress. Speaking in full sentences. Coherent responses. All questions answered, understanding of plan expressed.   Assessment and Plan: Acute cough - Plan: azithromycin (ZITHROMAX) 250 MG tablet, benzonatate (TESSALON) 100 MG capsule  LRTI (lower respiratory tract infection) - Plan: azithromycin (ZITHROMAX) 250 MG tablet, benzonatate (TESSALON) 100 MG capsule  Probable viral infection initially, now with worsening cough. Sinobronchitis. Progressive symptoms.   - contact precautions, repeat covid testing as option.    - Zpak, possible risks, side effects discussed.   - tessalon perles, mucinex, fluids, rest, symptomatic care.   - rtc precautions.    Follow Up Instructions: As needed.    I  discussed the assessment and treatment plan with the patient. The patient was provided an opportunity to ask questions and all were answered. The patient agreed with the plan and demonstrated an understanding of the instructions.   The patient was advised to call back or seek an in-person evaluation if the symptoms worsen or if the condition fails to improve as anticipated.  [name], MD

## 2021-02-03 NOTE — Patient Instructions (Addendum)
Try mucinex or mucinex or mucinex DM, tessalon perles if needed. Start antibiotic. If fevers not resolving in next 2-3 days, or any worsening symptoms be seen.   Return to the clinic or go to the nearest emergency room if any of your symptoms worsen or new symptoms occur.   Cough, Adult Coughing is a reflex that clears your throat and your airways (respiratory system). Coughing helps to heal and protect your lungs. It is normal to cough occasionally, but a cough that happens with other symptoms or lasts a long time may be a sign of a condition that needs treatment. An acute cough may only last 2-3 weeks, while a chronic cough may last 8 or more weeks. Coughing is commonly caused by: Infection of the respiratory systemby viruses or bacteria. Breathing in substances that irritate your lungs. Allergies. Asthma. Mucus that runs down the back of your throat (postnasal drip). Smoking. Acid backing up from the stomach into the esophagus (gastroesophageal reflux). Certain medicines. Chronic lung problems. Other medical conditions such as heart failure or a blood clot in the lung (pulmonary embolism). Follow these instructions at home: Medicines Take over-the-counter and prescription medicines only as told by your health care provider. Talk with your health care provider before you take a cough suppressant medicine. Lifestyle  Avoid cigarette smoke. Do not use any products that contain nicotine or tobacco, such as cigarettes, e-cigarettes, and chewing tobacco. If you need help quitting, ask your health care provider. Drink enough fluid to keep your urine pale yellow. Avoid caffeine. Do not drink alcohol if your health care provider tells you not to drink. General instructions  Pay close attention to changes in your cough. Tell your health care provider about them. Always cover your mouth when you cough. Avoid things that make you cough, such as perfume, candles, cleaning products, or campfire  or tobacco smoke. If the air is dry, use a cool mist vaporizer or humidifier in your bedroom or your home to help loosen secretions. If your cough is worse at night, try to sleep in a semi-upright position. Rest as needed. Keep all follow-up visits as told by your health care provider. This is important. Contact a health care provider if you: Have new symptoms. Cough up pus. Have a cough that does not get better after 2-3 weeks or gets worse. Cannot control your cough with cough suppressant medicines and you are losing sleep. Have pain that gets worse or pain that is not helped with medicine. Have a fever. Have unexplained weight loss. Have night sweats. Get help right away if: You cough up blood. You have difficulty breathing. Your heartbeat is very fast. These symptoms may represent a serious problem that is an emergency. Do not wait to see if the symptoms will go away. Get medical help right away. Call your local emergency services (911 in the [place] Do not drive yourself to the hospital. Summary Coughing is a reflex that clears your throat and your airways. It is normal to cough occasionally, but a cough that happens with other symptoms or lasts a long time may be a sign of a condition that needs treatment. Take over-the-counter and prescription medicines only as told by your health care provider. Always cover your mouth when you cough. Contact a health care provider if you have new symptoms or a cough that does not get better after 2-3 weeks or gets worse. This information is not intended to replace advice given to you by your health care provider. Make sure  you discuss any questions you have with your health care provider. Document Revised: 04/16/2018 Document Reviewed: 04/16/2018 Elsevier Patient Education  [place].

## 2021-02-05 ENCOUNTER — Other Ambulatory Visit: Payer: Self-pay | Admitting: Registered Nurse

## 2021-02-05 ENCOUNTER — Telehealth: Payer: Self-pay | Admitting: Registered Nurse

## 2021-02-05 DIAGNOSIS — R051 Acute cough: Secondary | ICD-10-CM

## 2021-02-05 MED ORDER — PROMETHAZINE-DM 6.25-15 MG/5ML PO SYRP: 5.0000 mL | ORAL_SOLUTION | Freq: Four times a day (QID) | ORAL | 0 refills | Status: DC | PRN

## 2021-02-05 NOTE — Telephone Encounter (Signed)
..  Caller name:Nancy Smith  On DPR? :yes/no: Yes  Call back [phone]  Provider they see:  Maximiano Coss but had appt. With Dr. Carlota Raspberry and he prescribed an antibiotic  Reason for call: Patient is feeling better and thinks that the antibiotic helped but feels like she needs something stronger for the cough than over the counter medications.  Please advise.

## 2021-02-05 NOTE — Telephone Encounter (Signed)
Have sent a promethazine-dextromethorphan syrup to use qid prn  Thanks,  Rich

## 2021-02-11 ENCOUNTER — Ambulatory Visit: Payer: Medicare PPO | Admitting: Registered Nurse

## 2021-02-17 ENCOUNTER — Ambulatory Visit: Payer: Medicare PPO | Admitting: Registered Nurse

## 2021-02-17 ENCOUNTER — Encounter: Payer: Self-pay | Admitting: Registered Nurse

## 2021-02-17 ENCOUNTER — Other Ambulatory Visit: Payer: Self-pay

## 2021-02-17 VITALS — BP 122/65 | HR 63 | Temp 98.3°F | Resp 18 | Ht 65.0 in | Wt 113.2 lb

## 2021-02-17 DIAGNOSIS — R5382 Chronic fatigue, unspecified: Secondary | ICD-10-CM | POA: Diagnosis not present

## 2021-02-17 DIAGNOSIS — Z8639 Personal history of other endocrine, nutritional and metabolic disease: Secondary | ICD-10-CM | POA: Diagnosis not present

## 2021-02-17 DIAGNOSIS — E7800 Pure hypercholesterolemia, unspecified: Secondary | ICD-10-CM | POA: Diagnosis not present

## 2021-02-17 NOTE — Patient Instructions (Signed)
Ms Nancy Smith to see you!  Ok to half sertraline - if not working, go back to 100mg   Ok to do omeprazole for 2 weeks at a time. Take first thing in am with water.  Recommend calling hand specialist  Will let you know how labs look - past ones have been great!  Give [employer] a call to see if they have to have a physical, otherwise we can do this in 6 mo. I'll try to check on my end too  Thank you  Rich

## 2021-02-17 NOTE — Progress Notes (Signed)
Established Patient Office Visit  Subjective:  Patient ID: Nancy Smith, female    DOB: 1955-07-06  Age: 65 y.o. MRN: [id number]  CC:  Chief Complaint  Patient presents with   Follow-up    Patient states she is here for a follow up possible a physical .PHQ9=7 Gad7=4    HPI Nancy Smith presents for CPE  No acute concerns. Histories reviewed and updated with patient.   [place] with repeating labs today.  Last labs showed elevated lipids with LDL to 111.  Otherwise no acute findings on review of labs.  Anxiety Stable on sertraline 100mg  po qd, bupropion 300mg  XL po qd, alprazolam 0.5mg  po bid prn. No acute concerns   Ongoing cough Subacute Had infection in October Seen by Dr. Carlota Raspberry in [place], given abx  Eventually she has started doing better, feeling better today. We had tried given her omeprazole as she had reported globus sensation as well This was very helpful.  Fatigue Still ongoing - notes hx of possible anemia or vitamin deficiency Would like to check labs Cough improving as above, no other symptoms   Past Medical History:  Diagnosis Date   Colitis    Lymphocytic colitis; had 2 flares in her lifetime   Hx of appendicitis    Hyperlipidemia     Past Surgical History:  Procedure Laterality Date   [place]   Femoral   MOHS SURGERY     for Melanoma   TONSILLECTOMY  1960    Family History  Problem Relation Age of Onset   Heart failure Mother    Lung cancer Mother        Smoker   CAD Mother        s/p bypass   Osteoporosis Mother    Heart failure Father    Hyperlipidemia Father    Heart disease Father    Atrial fibrillation Father    Congestive Heart Failure Brother     Social History   Socioeconomic History   Marital status: Married    Spouse name: Not on file   Number of children: Not on file   Years of education: Not on file   Highest education level: Master's degree (e.g., MA, MS, MEng,  MEd, MSW, MBA)  Occupational History   Not on file  Tobacco Use   Smoking status: Former    Packs/day: 0.25    Years: 15.00    Pack years: 3.75    Types: Cigarettes    Quit date: 1994    Years since quitting: 28.8   Smokeless tobacco: Never  Vaping Use   Vaping Use: Never used  Substance and Sexual Activity   Alcohol use: Yes    Comment: occasionally   Drug use: Never   Sexual activity: Not on file  Other Topics Concern   Not on file  Social History Narrative   Not on file   Social Determinants of Health   Financial Resource Strain: Not on file  Food Insecurity: Not on file  Transportation Needs: Not on file  Physical Activity: Not on file  Stress: Not on file  Social Connections: Not on file  Intimate Partner Violence: Not on file    Outpatient Medications Prior to Visit  Medication Sig Dispense Refill   ALPRAZolam (XANAX) 0.5 MG tablet Take 1 tablet (0.5 mg total) by mouth 2 (two) times daily as needed for anxiety. 60 tablet 1   azithromycin (ZITHROMAX) 250  MG tablet Take 2 pills by mouth on day 1, then 1 pill by mouth per day on days 2 through 5. 6 tablet 0   benzonatate (TESSALON) 100 MG capsule Take 1 capsule (100 mg total) by mouth 3 (three) times daily as needed for cough. 20 capsule 0   budesonide (ENTOCORT EC) 3 MG 24 hr capsule Take by mouth daily.     buPROPion (WELLBUTRIN XL) 300 MG 24 hr tablet Take 1 tablet (300 mg total) by mouth daily. 90 tablet 0   calcium carbonate (TUMS EX) 750 MG chewable tablet Chew 1 tablet by mouth daily.     cetirizine (ZYRTEC ALLERGY) 10 MG tablet Take 1 tablet (10 mg total) by mouth daily. 90 tablet 3   meloxicam (MOBIC) 7.5 MG tablet Take 1 tablet (7.5 mg total) by mouth daily. 30 tablet 3   omeprazole (PRILOSEC) 20 MG capsule Take 2 capsules (40 mg total) by mouth daily. 90 capsule 1   promethazine-dextromethorphan (PROMETHAZINE-DM) 6.25-15 MG/5ML syrup Take 5 mLs by mouth 4 (four) times daily as needed for cough. 118 mL 0    sertraline (ZOLOFT) 100 MG tablet Take 1 tablet (100 mg total) by mouth daily. 90 tablet 2   sucralfate (CARAFATE) 1 GM/10ML suspension Take 10 mLs (1 g total) by mouth 4 (four) times daily -  with meals and at bedtime. 420 mL 0   traZODone (DESYREL) 50 MG tablet TAKE 1 TABLET(50 MG) BY MOUTH AT BEDTIME AS NEEDED FOR SLEEP 90 tablet 1   No facility-administered medications prior to visit.    Allergies  Allergen Reactions   Latex    Strattera [Atomoxetine] Palpitations    ROS Review of Systems  Constitutional: Negative.   HENT: Negative.    Eyes: Negative.   Respiratory: Negative.    Cardiovascular: Negative.   Gastrointestinal: Negative.   Genitourinary: Negative.   Musculoskeletal: Negative.   Skin: Negative.   Neurological: Negative.   Psychiatric/Behavioral: Negative.    All other systems reviewed and are negative.    Objective:    Physical Exam Vitals and nursing note reviewed.  Constitutional:      General: She is not in acute distress.    Appearance: Normal appearance. She is normal weight. She is not ill-appearing, toxic-appearing or diaphoretic.  Cardiovascular:     Rate and Rhythm: Normal rate and regular rhythm.     Heart sounds: Normal heart sounds. No murmur heard.   No friction rub. No gallop.  Pulmonary:     Effort: Pulmonary effort is normal. No respiratory distress.     Breath sounds: Normal breath sounds. No stridor. No wheezing, rhonchi or rales.  Chest:     Chest wall: No tenderness.  Skin:    General: Skin is warm and dry.  Neurological:     General: No focal deficit present.     Mental Status: She is alert and oriented to person, place, and time. Mental status is at baseline.  Psychiatric:        Mood and Affect: Mood normal.        Behavior: Behavior normal.        Thought Content: Thought content normal.        Judgment: Judgment normal.    BP 122/65   Pulse 63   Temp 98.3 F (36.8 C) (Temporal)   Resp 18   Ht 5\' 5"  (1.651 m)   Wt  113 lb 3.2 oz (51.3 kg)   SpO2 99%   BMI 18.84 kg/m  Wt Readings from Last 3 Encounters:  02/17/21 113 lb 3.2 oz (51.3 kg)  12/11/20 116 lb 6.4 oz (52.8 kg)  08/11/20 115 lb (52.2 kg)     Health Maintenance Due  Topic Date Due   Pneumonia Vaccine 43+ Years old (2 - PPSV23 if available, else PCV20) 02/22/2020   TETANUS/TDAP  10/20/2020    There are no preventive care reminders to display for this patient.  Lab Results  Component Value Date   TSH 1.49 12/18/2019   Lab Results  Component Value Date   WBC 4.1 02/20/2020   HGB 12.6 02/20/2020   HCT 37.6 02/20/2020   MCV 88.4 02/20/2020   PLT 214.0 02/20/2020   Lab Results  Component Value Date   NA 138 08/11/2020   K 3.9 08/11/2020   CO2 27 08/11/2020   GLUCOSE 86 08/11/2020   BUN 11 08/11/2020   CREATININE 0.78 08/11/2020   BILITOT 0.4 08/11/2020   ALKPHOS 53 08/11/2020   AST 17 08/11/2020   ALT 11 08/11/2020   PROT 6.5 08/11/2020   ALBUMIN 4.3 08/11/2020   CALCIUM 9.4 08/11/2020   ANIONGAP 8 02/20/2019   GFR 78.93 08/11/2020   Lab Results  Component Value Date   CHOL 195 08/11/2020   Lab Results  Component Value Date   HDL 69.80 08/11/2020   Lab Results  Component Value Date   LDLCALC 111 (H) 08/11/2020   Lab Results  Component Value Date   TRIG 69.0 08/11/2020   Lab Results  Component Value Date   CHOLHDL 3 08/11/2020   Lab Results  Component Value Date   HGBA1C 5.6 02/20/2020      Assessment & Plan:   Problem List Items Addressed This Visit       Other   Hypercholesterolemia - Primary (Chronic)   Relevant Orders   Lipid panel   Other Visit Diagnoses     Chronic fatigue       Relevant Orders   CBC with Differential/Platelet   Comprehensive metabolic panel   Lipid panel   TSH   Hemoglobin A1c   History of elevated glucose       Relevant Orders   Hemoglobin A1c       No orders of the defined types were placed in this encounter.   Follow-up: Return in about 2 weeks  (around 03/03/2021) for within 2 weeks for fasting labs.    PLAN Pt in overall good health with stable conditions. Continue current regimen. She will return for repeat lab work when fasting. Will follow up on results when available. Return in 6 mo for OV with me Patient encouraged to call clinic with any questions, comments, or concerns.

## 2021-02-18 ENCOUNTER — Encounter: Payer: Self-pay | Admitting: Registered Nurse

## 2021-03-01 ENCOUNTER — Ambulatory Visit (INDEPENDENT_AMBULATORY_CARE_PROVIDER_SITE_OTHER): Payer: Medicare PPO | Admitting: Registered Nurse

## 2021-03-01 ENCOUNTER — Other Ambulatory Visit: Payer: Self-pay

## 2021-03-01 ENCOUNTER — Encounter: Payer: Self-pay | Admitting: Registered Nurse

## 2021-03-01 VITALS — BP 110/72 | HR 70 | Temp 98.2°F | Resp 118 | Ht 65.0 in | Wt 113.2 lb

## 2021-03-01 DIAGNOSIS — E7800 Pure hypercholesterolemia, unspecified: Secondary | ICD-10-CM

## 2021-03-01 DIAGNOSIS — R5382 Chronic fatigue, unspecified: Secondary | ICD-10-CM

## 2021-03-01 DIAGNOSIS — Z5320 Procedure and treatment not carried out because of patient's decision for unspecified reasons: Secondary | ICD-10-CM

## 2021-03-01 DIAGNOSIS — Z8719 Personal history of other diseases of the digestive system: Secondary | ICD-10-CM

## 2021-03-01 DIAGNOSIS — F339 Major depressive disorder, recurrent, unspecified: Secondary | ICD-10-CM

## 2021-03-01 DIAGNOSIS — K219 Gastro-esophageal reflux disease without esophagitis: Secondary | ICD-10-CM | POA: Diagnosis not present

## 2021-03-01 DIAGNOSIS — E785 Hyperlipidemia, unspecified: Secondary | ICD-10-CM

## 2021-03-01 DIAGNOSIS — F419 Anxiety disorder, unspecified: Secondary | ICD-10-CM

## 2021-03-01 DIAGNOSIS — F32A Depression, unspecified: Secondary | ICD-10-CM

## 2021-03-01 DIAGNOSIS — Z8639 Personal history of other endocrine, nutritional and metabolic disease: Secondary | ICD-10-CM

## 2021-03-01 DIAGNOSIS — Z0000 Encounter for general adult medical examination without abnormal findings: Secondary | ICD-10-CM | POA: Diagnosis not present

## 2021-03-01 DIAGNOSIS — Z86010 Personal history of colonic polyps: Secondary | ICD-10-CM

## 2021-03-01 MED ORDER — BUPROPION HCL ER (XL) 300 MG PO TB24: 300.0000 mg | ORAL_TABLET | Freq: Every day | ORAL | 1 refills | Status: DC

## 2021-03-01 MED ORDER — TRAZODONE HCL 50 MG PO TABS: ORAL_TABLET | ORAL | 1 refills | Status: DC

## 2021-03-01 MED ORDER — OMEPRAZOLE 20 MG PO CPDR: 40.0000 mg | DELAYED_RELEASE_CAPSULE | Freq: Every day | ORAL | 1 refills | Status: DC

## 2021-03-01 MED ORDER — SERTRALINE HCL 100 MG PO TABS: 100.0000 mg | ORAL_TABLET | Freq: Every day | ORAL | 1 refills | Status: DC

## 2021-03-01 NOTE — Patient Instructions (Addendum)
Ms. Nancy Smith to see you!  No concerns on exam. I'll let you know when I have lab results in and we can discuss any abnormalities.  Can be here or at [place]  See you annually for a physical, sooner for concerns.   Thank you!  Rich     If you have lab work done today you will be contacted with your lab results within the next 2 weeks.  If you have not heard from [place] then please contact us. The fastest way to get your results is to register for My Chart.   IF you received an x-ray today, you will receive an invoice from [facility]. Please contact [facility] at [phone] with questions or concerns regarding your invoice.   IF you received labwork today, you will receive an invoice from [place]. Please contact [facility] at [phone] with questions or concerns regarding your invoice.   Our billing staff will not be able to assist you with questions regarding bills from these companies.  You will be contacted with the lab results as soon as they are available. The fastest way to get your results is to activate your My Chart account. Instructions are located on the last page of this paperwork. If you have not heard from [place] regarding the results in 2 weeks, please contact this office.

## 2021-03-01 NOTE — Addendum Note (Signed)
Addended by: Veneda Melter on: 03/01/2021 03:42 PM   Modules accepted: Orders

## 2021-03-01 NOTE — Addendum Note (Signed)
Addended by: Maximiano Coss on: 03/01/2021 04:02 PM   Modules accepted: Orders

## 2021-03-01 NOTE — Progress Notes (Addendum)
Established Patient Office Visit  Subjective:  Patient ID: Nancy Smith, female    DOB: [birth year]  Age: 65 y.o. MRN: [id number]  CC:  Chief Complaint  Patient presents with   Annual Exam    Patient states she is here for a physical .    HPI Nancy Smith presents for CPE  No acute concerns Notes she has not changed sertraline dosage as we had discussed in previous visit.   Histories reviewed and updated with patient.   Hx of colitis and colon polyps. No acute concerns but would like ot est with GI. Has been seen by Dr. Benson Norway and Dr. Henrene Pastor in the past but would prefer female provider.  Unsure of last pap smear. No hx abnormals. Would like referral to Gyn for well woman.  Past Medical History:  Diagnosis Date   Colitis    Lymphocytic colitis; had 2 flares in her lifetime   Hx of appendicitis    Hyperlipidemia     Past Surgical History:  Procedure Laterality Date   [place]   Femoral   MOHS SURGERY     for Melanoma   TONSILLECTOMY  1960    Family History  Problem Relation Age of Onset   Heart failure Mother    Lung cancer Mother        Smoker   CAD Mother        s/p bypass   Osteoporosis Mother    Heart failure Father    Hyperlipidemia Father    Heart disease Father    Atrial fibrillation Father    Congestive Heart Failure Brother     Social History   Socioeconomic History   Marital status: Married    Spouse name: Not on file   Number of children: Not on file   Years of education: Not on file   Highest education level: Master's degree (e.g., MA, MS, MEng, MEd, MSW, MBA)  Occupational History   Not on file  Tobacco Use   Smoking status: Former    Packs/day: 0.25    Years: 15.00    Pack years: 3.75    Types: Cigarettes    Quit date: 1994    Years since quitting: 28.9   Smokeless tobacco: Never  Vaping Use   Vaping Use: Never used  Substance and Sexual Activity   Alcohol use: Yes     Comment: occasionally   Drug use: Never   Sexual activity: Not on file  Other Topics Concern   Not on file  Social History Narrative   Not on file   Social Determinants of Health   Financial Resource Strain: Not on file  Food Insecurity: Not on file  Transportation Needs: Not on file  Physical Activity: Not on file  Stress: Not on file  Social Connections: Not on file  Intimate Partner Violence: Not on file    Outpatient Medications Prior to Visit  Medication Sig Dispense Refill   ALPRAZolam (XANAX) 0.5 MG tablet Take 1 tablet (0.5 mg total) by mouth 2 (two) times daily as needed for anxiety. 60 tablet 1   benzonatate (TESSALON) 100 MG capsule Take 1 capsule (100 mg total) by mouth 3 (three) times daily as needed for cough. 20 capsule 0   budesonide (ENTOCORT EC) 3 MG 24 hr capsule Take by mouth daily.     calcium carbonate (TUMS EX) 750 MG chewable tablet Chew 1 tablet by mouth daily.  cetirizine (ZYRTEC ALLERGY) 10 MG tablet Take 1 tablet (10 mg total) by mouth daily. 90 tablet 3   meloxicam (MOBIC) 7.5 MG tablet Take 1 tablet (7.5 mg total) by mouth daily. 30 tablet 3   promethazine-dextromethorphan (PROMETHAZINE-DM) 6.25-15 MG/5ML syrup Take 5 mLs by mouth 4 (four) times daily as needed for cough. 118 mL 0   sucralfate (CARAFATE) 1 GM/10ML suspension Take 10 mLs (1 g total) by mouth 4 (four) times daily -  with meals and at bedtime. 420 mL 0   azithromycin (ZITHROMAX) 250 MG tablet Take 2 pills by mouth on day 1, then 1 pill by mouth per day on days 2 through 5. 6 tablet 0   buPROPion (WELLBUTRIN XL) 300 MG 24 hr tablet Take 1 tablet (300 mg total) by mouth daily. 90 tablet 0   omeprazole (PRILOSEC) 20 MG capsule Take 2 capsules (40 mg total) by mouth daily. 90 capsule 1   sertraline (ZOLOFT) 100 MG tablet Take 1 tablet (100 mg total) by mouth daily. 90 tablet 2   traZODone (DESYREL) 50 MG tablet TAKE 1 TABLET(50 MG) BY MOUTH AT BEDTIME AS NEEDED FOR SLEEP 90 tablet 1   No  facility-administered medications prior to visit.    Allergies  Allergen Reactions   Latex    Strattera [Atomoxetine] Palpitations    ROS Review of Systems  Constitutional: Negative.   HENT: Negative.    Eyes: Negative.   Respiratory: Negative.    Cardiovascular: Negative.   Gastrointestinal: Negative.   Genitourinary: Negative.   Musculoskeletal: Negative.   Skin: Negative.   Neurological: Negative.   Psychiatric/Behavioral: Negative.    All other systems reviewed and are negative.    Objective:    Physical Exam Vitals and nursing note reviewed.  Constitutional:      General: She is not in acute distress.    Appearance: Normal appearance. She is normal weight. She is not ill-appearing, toxic-appearing or diaphoretic.  HENT:     Head: Normocephalic and atraumatic.     Right Ear: Tympanic membrane, ear canal and external ear normal. There is no impacted cerumen.     Left Ear: Tympanic membrane, ear canal and external ear normal. There is no impacted cerumen.     Nose: Nose normal. No congestion or rhinorrhea.     Mouth/Throat:     Mouth: Mucous membranes are moist.     Pharynx: Oropharynx is clear. No oropharyngeal exudate or posterior oropharyngeal erythema.  Eyes:     General: No scleral icterus.       Right eye: No discharge.        Left eye: No discharge.     Extraocular Movements: Extraocular movements intact.     Conjunctiva/sclera: Conjunctivae normal.     Pupils: Pupils are equal, round, and reactive to light.  Cardiovascular:     Rate and Rhythm: Normal rate and regular rhythm.     Pulses: Normal pulses.     Heart sounds: Normal heart sounds. No murmur heard.   No friction rub. No gallop.  Pulmonary:     Effort: Pulmonary effort is normal. No respiratory distress.     Breath sounds: Normal breath sounds. No stridor. No wheezing, rhonchi or rales.  Chest:     Chest wall: No tenderness.  Abdominal:     General: Abdomen is flat. Bowel sounds are normal.  There is no distension.     Palpations: Abdomen is soft. There is no mass.     Tenderness: There is no  abdominal tenderness. There is no right CVA tenderness, left CVA tenderness, guarding or rebound.     Hernia: No hernia is present.  Musculoskeletal:        General: No swelling, tenderness, deformity or signs of injury. Normal range of motion.     Right lower leg: No edema.     Left lower leg: No edema.  Skin:    General: Skin is warm and dry.     Capillary Refill: Capillary refill takes less than 2 seconds.     Coloration: Skin is not jaundiced or pale.     Findings: No bruising, erythema, lesion or rash.  Neurological:     General: No focal deficit present.     Mental Status: She is alert and oriented to person, place, and time. Mental status is at baseline.     Cranial Nerves: No cranial nerve deficit.     Sensory: No sensory deficit.     Motor: No weakness.     Coordination: Coordination normal.     Gait: Gait normal.     Deep Tendon Reflexes: Reflexes normal.  Psychiatric:        Mood and Affect: Mood normal.        Behavior: Behavior normal.        Thought Content: Thought content normal.        Judgment: Judgment normal.    BP 110/72   Pulse 70   Temp 98.2 F (36.8 C) (Temporal)   Resp (!) 118   Ht 5\' 5"  (1.651 m)   Wt 113 lb 3.2 oz (51.3 kg)   BMI 18.84 kg/m  Wt Readings from Last 3 Encounters:  03/01/21 113 lb 3.2 oz (51.3 kg)  02/17/21 113 lb 3.2 oz (51.3 kg)  12/11/20 116 lb 6.4 oz (52.8 kg)     Health Maintenance Due  Topic Date Due   Pneumonia Vaccine 67+ Years old (2 - PPSV23 if available, else PCV20) 02/22/2020   COVID-19 Vaccine (4 - Booster for Pfizer series) 03/05/2020   TETANUS/TDAP  10/20/2020    There are no preventive care reminders to display for this patient.  Lab Results  Component Value Date   TSH 1.49 12/18/2019   Lab Results  Component Value Date   WBC 4.1 02/20/2020   HGB 12.6 02/20/2020   HCT 37.6 02/20/2020   MCV 88.4  02/20/2020   PLT 214.0 02/20/2020   Lab Results  Component Value Date   NA 138 08/11/2020   K 3.9 08/11/2020   CO2 27 08/11/2020   GLUCOSE 86 08/11/2020   BUN 11 08/11/2020   CREATININE 0.78 08/11/2020   BILITOT 0.4 08/11/2020   ALKPHOS 53 08/11/2020   AST 17 08/11/2020   ALT 11 08/11/2020   PROT 6.5 08/11/2020   ALBUMIN 4.3 08/11/2020   CALCIUM 9.4 08/11/2020   ANIONGAP 8 02/20/2019   GFR 78.93 08/11/2020   Lab Results  Component Value Date   CHOL 195 08/11/2020   Lab Results  Component Value Date   HDL 69.80 08/11/2020   Lab Results  Component Value Date   LDLCALC 111 (H) 08/11/2020   Lab Results  Component Value Date   TRIG 69.0 08/11/2020   Lab Results  Component Value Date   CHOLHDL 3 08/11/2020   Lab Results  Component Value Date   HGBA1C 5.6 02/20/2020      Assessment & Plan:   Problem List Items Addressed This Visit       Digestive   Gastroesophageal reflux  disease (Chronic)   Relevant Medications   omeprazole (PRILOSEC) 20 MG capsule     Other   Anxiety and depression (Chronic)   Relevant Medications   buPROPion (WELLBUTRIN XL) 300 MG 24 hr tablet   sertraline (ZOLOFT) 100 MG tablet   traZODone (DESYREL) 50 MG tablet   History of colon polyps   Relevant Orders   Ambulatory referral to Gastroenterology   Other Visit Diagnoses     Annual physical exam    -  Primary   Relevant Orders   Comprehensive metabolic panel   CBC with Differential/Platelet   TSH   Lipid panel   Hemoglobin A1c   Depression, recurrent (HCC)       Relevant Medications   buPROPion (WELLBUTRIN XL) 300 MG 24 hr tablet   sertraline (ZOLOFT) 100 MG tablet   traZODone (DESYREL) 50 MG tablet   History of colitis       Relevant Orders   Ambulatory referral to Gastroenterology   Pap smear of cervix declined       Relevant Orders   Ambulatory referral to Gynecology       Meds ordered this encounter  Medications   buPROPion (WELLBUTRIN XL) 300 MG 24 hr  tablet    Sig: Take 1 tablet (300 mg total) by mouth daily.    Dispense:  90 tablet    Refill:  1    Order Specific Question:   Supervising Provider    Answer:   Carlota Raspberry, JEFFREY R [[id number]]   sertraline (ZOLOFT) 100 MG tablet    Sig: Take 1 tablet (100 mg total) by mouth daily.    Dispense:  90 tablet    Refill:  1    Order Specific Question:   Supervising Provider    Answer:   Carlota Raspberry, JEFFREY R [[id number]]   traZODone (DESYREL) 50 MG tablet    Sig: TAKE 1 TABLET(50 MG) BY MOUTH AT BEDTIME AS NEEDED FOR SLEEP    Dispense:  90 tablet    Refill:  1    Order Specific Question:   Supervising Provider    Answer:   Carlota Raspberry, JEFFREY R [[id number]]   omeprazole (PRILOSEC) 20 MG capsule    Sig: Take 2 capsules (40 mg total) by mouth daily.    Dispense:  90 capsule    Refill:  1    Order Specific Question:   Supervising Provider    Answer:   Carlota Raspberry, JEFFREY R [[id number]]     Follow-up: Return in about 1 year (around 03/01/2022) for CPE and labs.   PLAN Exam unremarkable Lab orders placed. Will return fasting or go to [place]. Return annually for CPE and labs Patient encouraged to call clinic with any questions, comments, or concerns.  Maximiano Coss, NP

## 2021-03-02 LAB — LIPID PANEL
Cholesterol: 200 mg/dL (ref 0–200)
HDL: 59.3 mg/dL (ref >39.00)
LDL Cholesterol: 129 mg/dL — ABNORMAL HIGH (ref 0–99)
NonHDL: 140.23
Total CHOL/HDL Ratio: 3
Triglycerides: 54 mg/dL (ref 0.0–149.0)
VLDL: 10.8 mg/dL (ref 0.0–40.0)

## 2021-03-02 LAB — CBC WITH DIFFERENTIAL/PLATELET
Basophils Absolute: 0.1 10*3/uL (ref 0.0–0.1)
Basophils Relative: 1.2 % (ref 0.0–3.0)
Eosinophils Absolute: 0.2 10*3/uL (ref 0.0–0.7)
Eosinophils Relative: 3.7 % (ref 0.0–5.0)
HCT: 37.1 % (ref 36.0–46.0)
Hemoglobin: 12.4 g/dL (ref 12.0–15.0)
Lymphocytes Relative: 37.3 % (ref 12.0–46.0)
Lymphs Abs: 1.8 10*3/uL (ref 0.7–4.0)
MCHC: 33.3 g/dL (ref 30.0–36.0)
MCV: 86.8 fl (ref 78.0–100.0)
Monocytes Absolute: 0.3 10*3/uL (ref 0.1–1.0)
Monocytes Relative: 7.1 % (ref 3.0–12.0)
Neutro Abs: 2.4 10*3/uL (ref 1.4–7.7)
Neutrophils Relative %: 50.7 % (ref 43.0–77.0)
Platelets: 238 10*3/uL (ref 150.0–400.0)
RBC: 4.27 Mil/uL (ref 3.87–5.11)
RDW: 12.6 % (ref 11.5–15.5)
WBC: 4.7 10*3/uL (ref 4.0–10.5)

## 2021-03-02 LAB — COMPREHENSIVE METABOLIC PANEL
ALT: 9 U/L (ref 0–35)
AST: 13 U/L (ref 0–37)
Albumin: 4.5 g/dL (ref 3.5–5.2)
Alkaline Phosphatase: 53 U/L (ref 39–117)
BUN: 11 mg/dL (ref 6–23)
CO2: 29 mEq/L (ref 19–32)
Calcium: 9.3 mg/dL (ref 8.4–10.5)
Chloride: 102 mEq/L (ref 96–112)
Creatinine, Ser: 0.86 mg/dL (ref 0.40–1.20)
GFR: 69.93 mL/min (ref >60.00)
Glucose, Bld: 89 mg/dL (ref 70–99)
Potassium: 4.1 mEq/L (ref 3.5–5.1)
Sodium: 139 mEq/L (ref 135–145)
Total Bilirubin: 0.4 mg/dL (ref 0.2–1.2)
Total Protein: 6.5 g/dL (ref 6.0–8.3)

## 2021-03-02 LAB — HEMOGLOBIN A1C: Hgb A1c MFr Bld: 5.4 % (ref 4.6–6.5)

## 2021-03-02 LAB — TSH: TSH: 2.04 u[IU]/mL (ref 0.35–5.50)

## 2021-03-08 ENCOUNTER — Other Ambulatory Visit: Payer: Self-pay | Admitting: Registered Nurse

## 2021-03-08 DIAGNOSIS — K219 Gastro-esophageal reflux disease without esophagitis: Secondary | ICD-10-CM

## 2021-03-08 DIAGNOSIS — Z5320 Procedure and treatment not carried out because of patient's decision for unspecified reasons: Secondary | ICD-10-CM

## 2021-03-11 ENCOUNTER — Encounter: Payer: Self-pay | Admitting: Registered Nurse

## 2021-03-19 DIAGNOSIS — H02831 Dermatochalasis of right upper eyelid: Secondary | ICD-10-CM | POA: Diagnosis not present

## 2021-03-19 DIAGNOSIS — H02834 Dermatochalasis of left upper eyelid: Secondary | ICD-10-CM | POA: Diagnosis not present

## 2021-03-19 DIAGNOSIS — H5203 Hypermetropia, bilateral: Secondary | ICD-10-CM | POA: Diagnosis not present

## 2021-03-19 DIAGNOSIS — H2513 Age-related nuclear cataract, bilateral: Secondary | ICD-10-CM | POA: Diagnosis not present

## 2021-03-26 ENCOUNTER — Encounter: Payer: Medicare PPO | Admitting: Obstetrics and Gynecology

## 2021-04-08 ENCOUNTER — Other Ambulatory Visit: Payer: Self-pay | Admitting: Registered Nurse

## 2021-04-08 DIAGNOSIS — F419 Anxiety disorder, unspecified: Secondary | ICD-10-CM

## 2021-04-08 MED ORDER — ALPRAZOLAM 0.5 MG PO TABS: 0.5000 mg | ORAL_TABLET | Freq: Two times a day (BID) | ORAL | 1 refills | Status: DC | PRN

## 2021-04-08 NOTE — Telephone Encounter (Signed)
Patient is requesting a refill of the following medications: Requested Prescriptions   Pending Prescriptions Disp Refills   ALPRAZolam (XANAX) 0.5 MG tablet 60 tablet 1    Sig: Take 1 tablet (0.5 mg total) by mouth 2 (two) times daily as needed for anxiety.    Date of patient request: 04/08/21 Last office visit: 03/01/21 Date of last refill: 01/29/21 Last refill amount: 60 tab x1R Follow up time period per chart: 1 year

## 2021-04-08 NOTE — Telephone Encounter (Signed)
Pt called in stating that [facility] sent [place] a refill request on Tuesday. I don't see any requests on file for the Xanax. Last refill date 01/29/21   Last appt 03/01/21   Pt uses [facility] on [address]   Please advise if refill is ok

## 2021-04-20 DIAGNOSIS — L821 Other seborrheic keratosis: Secondary | ICD-10-CM | POA: Diagnosis not present

## 2021-04-20 DIAGNOSIS — L812 Freckles: Secondary | ICD-10-CM | POA: Diagnosis not present

## 2021-04-20 DIAGNOSIS — Z85828 Personal history of other malignant neoplasm of skin: Secondary | ICD-10-CM | POA: Diagnosis not present

## 2021-04-20 DIAGNOSIS — L509 Urticaria, unspecified: Secondary | ICD-10-CM | POA: Diagnosis not present

## 2021-04-20 DIAGNOSIS — D225 Melanocytic nevi of trunk: Secondary | ICD-10-CM | POA: Diagnosis not present

## 2021-04-20 DIAGNOSIS — D2262 Melanocytic nevi of left upper limb, including shoulder: Secondary | ICD-10-CM | POA: Diagnosis not present

## 2021-04-20 DIAGNOSIS — L820 Inflamed seborrheic keratosis: Secondary | ICD-10-CM | POA: Diagnosis not present

## 2021-04-20 DIAGNOSIS — C44519 Basal cell carcinoma of skin of other part of trunk: Secondary | ICD-10-CM | POA: Diagnosis not present

## 2021-04-20 DIAGNOSIS — D485 Neoplasm of uncertain behavior of skin: Secondary | ICD-10-CM | POA: Diagnosis not present

## 2021-04-20 DIAGNOSIS — D2271 Melanocytic nevi of right lower limb, including hip: Secondary | ICD-10-CM | POA: Diagnosis not present

## 2021-06-09 ENCOUNTER — Telehealth: Payer: Self-pay | Admitting: Registered Nurse

## 2021-06-09 NOTE — Telephone Encounter (Signed)
Pt called and states that she needs a refill of her xanax 0.5 mg. She states that she is going out of town with her dad and would run out while being away, she states she would be away for about a week or two. Pt would like refill sent to [facility] on [address] [place].  ?  ?Please advice ?  ?Thank You. ?

## 2021-06-09 NOTE — Telephone Encounter (Signed)
Pt called and states that she needs a refill of her xanax 0.5 mg. She states that she is going out of town with her dad and would run out while being away, she states she would be away for about a week or two. Pt would like refill sent to [facility] on [address] [place].  ? ?Please advice ? ?Thank You. ?

## 2021-06-10 ENCOUNTER — Other Ambulatory Visit: Payer: Self-pay | Admitting: Registered Nurse

## 2021-06-10 DIAGNOSIS — F32A Depression, unspecified: Secondary | ICD-10-CM

## 2021-06-10 MED ORDER — ALPRAZOLAM 0.5 MG PO TABS: 0.5000 mg | ORAL_TABLET | Freq: Two times a day (BID) | ORAL | 1 refills | Status: DC | PRN

## 2021-06-10 NOTE — Telephone Encounter (Signed)
Sent Thanks Rich

## 2021-06-29 ENCOUNTER — Encounter: Payer: Self-pay | Admitting: *Deleted

## 2021-07-09 ENCOUNTER — Ambulatory Visit
Admission: RE | Admit: 2021-07-09 | Discharge: 2021-07-09 | Disposition: A | Discharge status: Home / Self Care | Payer: Medicare PPO | Source: Ambulatory Visit | Attending: Registered Nurse | Admitting: Registered Nurse

## 2021-07-09 ENCOUNTER — Other Ambulatory Visit: Payer: Self-pay | Admitting: Registered Nurse

## 2021-07-09 DIAGNOSIS — Z1231 Encounter for screening mammogram for malignant neoplasm of breast: Secondary | ICD-10-CM

## 2021-07-09 DIAGNOSIS — M8580 Other specified disorders of bone density and structure, unspecified site: Secondary | ICD-10-CM

## 2021-07-09 DIAGNOSIS — M8589 Other specified disorders of bone density and structure, multiple sites: Secondary | ICD-10-CM | POA: Diagnosis not present

## 2021-07-09 DIAGNOSIS — Z780 Asymptomatic menopausal state: Secondary | ICD-10-CM | POA: Diagnosis not present

## 2021-08-18 ENCOUNTER — Telehealth: Payer: Self-pay | Admitting: Registered Nurse

## 2021-08-18 NOTE — Telephone Encounter (Signed)
Pt called in asking for a refill on the xanax pt uses [facility] on [address]  ?

## 2021-08-19 ENCOUNTER — Other Ambulatory Visit: Payer: Self-pay | Admitting: Registered Nurse

## 2021-08-19 DIAGNOSIS — F419 Anxiety disorder, unspecified: Secondary | ICD-10-CM

## 2021-08-19 MED ORDER — ALPRAZOLAM 0.5 MG PO TABS: 0.5000 mg | ORAL_TABLET | Freq: Two times a day (BID) | ORAL | 1 refills | Status: DC | PRN

## 2021-08-19 NOTE — Telephone Encounter (Signed)
Sent ? ?Thanks, ? ?Rich

## 2021-08-24 ENCOUNTER — Ambulatory Visit: Payer: Medicare PPO | Admitting: Registered Nurse

## 2021-08-24 ENCOUNTER — Encounter: Payer: Self-pay | Admitting: Registered Nurse

## 2021-08-24 ENCOUNTER — Other Ambulatory Visit: Payer: Self-pay

## 2021-08-24 VITALS — BP 142/82 | HR 55 | Temp 98.0°F | Resp 18 | Ht 65.0 in | Wt 117.6 lb

## 2021-08-24 DIAGNOSIS — R9431 Abnormal electrocardiogram [ECG] [EKG]: Secondary | ICD-10-CM | POA: Diagnosis not present

## 2021-08-24 DIAGNOSIS — R030 Elevated blood-pressure reading, without diagnosis of hypertension: Secondary | ICD-10-CM | POA: Diagnosis not present

## 2021-08-24 DIAGNOSIS — R002 Palpitations: Secondary | ICD-10-CM | POA: Diagnosis not present

## 2021-08-24 LAB — COMPREHENSIVE METABOLIC PANEL
ALT: 14 U/L (ref 0–35)
AST: 18 U/L (ref 0–37)
Albumin: 4.7 g/dL (ref 3.5–5.2)
Alkaline Phosphatase: 54 U/L (ref 39–117)
BUN: 11 mg/dL (ref 6–23)
CO2: 29 mEq/L (ref 19–32)
Calcium: 9.6 mg/dL (ref 8.4–10.5)
Chloride: 102 mEq/L (ref 96–112)
Creatinine, Ser: 0.84 mg/dL (ref 0.40–1.20)
GFR: 71.69 mL/min (ref >60.00)
Glucose, Bld: 91 mg/dL (ref 70–99)
Potassium: 3.8 mEq/L (ref 3.5–5.1)
Sodium: 139 mEq/L (ref 135–145)
Total Bilirubin: 0.6 mg/dL (ref 0.2–1.2)
Total Protein: 6.8 g/dL (ref 6.0–8.3)

## 2021-08-24 LAB — LIPID PANEL
Cholesterol: 224 mg/dL — ABNORMAL HIGH (ref 0–200)
HDL: 81.5 mg/dL (ref >39.00)
LDL Cholesterol: 129 mg/dL — ABNORMAL HIGH (ref 0–99)
NonHDL: 142.43
Total CHOL/HDL Ratio: 3
Triglycerides: 65 mg/dL (ref 0.0–149.0)
VLDL: 13 mg/dL (ref 0.0–40.0)

## 2021-08-24 LAB — TSH: TSH: 1.93 u[IU]/mL (ref 0.35–5.50)

## 2021-08-24 NOTE — Progress Notes (Signed)
? ?Established Patient Office Visit ? ?Subjective:  ?Patient ID: Nancy Smith, female    DOB: Aug 30, 1955  Age: 66 y.o. MRN: [id number] ? ?CC:  ?Chief Complaint  ?Patient presents with  ? Hypertension  ?  Patient states she is here for elevated BP at home. Patient states she would like to discuss allergies.  ? ? ?HPI ?Nancy Smith presents for elevated bp, palpitations ? ?Has been ongoing for a few weeks. ?Happens in runs - woke up overnight with it one night. ?BP has been borderline high ? ?Has happened every day.  ? ?Notes she has checked her BP - 145/100 or so at times.  ? ?She has been under particular stress in past few years. ? ? ?Outpatient Medications Prior to Visit  ?Medication Sig Dispense Refill  ? ALPRAZolam (XANAX) 0.5 MG tablet Take 1 tablet (0.5 mg total) by mouth 2 (two) times daily as needed for anxiety. 60 tablet 1  ? benzonatate (TESSALON) 100 MG capsule Take 1 capsule (100 mg total) by mouth 3 (three) times daily as needed for cough. 20 capsule 0  ? budesonide (ENTOCORT EC) 3 MG 24 hr capsule Take by mouth daily.    ? buPROPion (WELLBUTRIN XL) 300 MG 24 hr tablet Take 1 tablet (300 mg total) by mouth daily. 90 tablet 1  ? calcium carbonate (TUMS EX) 750 MG chewable tablet Chew 1 tablet by mouth daily.    ? cetirizine (ZYRTEC ALLERGY) 10 MG tablet Take 1 tablet (10 mg total) by mouth daily. 90 tablet 3  ? meloxicam (MOBIC) 7.5 MG tablet Take 1 tablet (7.5 mg total) by mouth daily. 30 tablet 3  ? omeprazole (PRILOSEC) 20 MG capsule Take 2 capsules (40 mg total) by mouth daily. 90 capsule 1  ? sertraline (ZOLOFT) 100 MG tablet Take 1 tablet (100 mg total) by mouth daily. 90 tablet 1  ? sucralfate (CARAFATE) 1 GM/10ML suspension Take 10 mLs (1 g total) by mouth 4 (four) times daily -  with meals and at bedtime. 420 mL 0  ? traZODone (DESYREL) 50 MG tablet TAKE 1 TABLET(50 MG) BY MOUTH AT BEDTIME AS NEEDED FOR SLEEP 90 tablet 1  ? promethazine-dextromethorphan (PROMETHAZINE-DM) 6.25-15 MG/5ML syrup  Take 5 mLs by mouth 4 (four) times daily as needed for cough. 118 mL 0  ? ?No facility-administered medications prior to visit.  ? ? ?Review of Systems  ?Constitutional: Negative.   ?HENT: Negative.    ?Eyes: Negative.   ?Respiratory: Negative.    ?Cardiovascular: Negative.   ?Gastrointestinal: Negative.   ?Genitourinary: Negative.   ?Musculoskeletal: Negative.   ?Skin: Negative.   ?Neurological: Negative.   ?Psychiatric/Behavioral: Negative.    ?All other systems reviewed and are negative. ? ?  ?Objective:  ?  ? ?BP (!) 142/82   Pulse (!) 55   Temp 98 ?F (36.7 ?C) (Temporal)   Resp 18   Ht '5\' 5"'$  (1.651 m)   Wt 117 lb 9.6 oz (53.3 kg)   SpO2 100%   BMI 19.57 kg/m?  ? ?Wt Readings from Last 3 Encounters:  ?08/24/21 117 lb 9.6 oz (53.3 kg)  ?03/01/21 113 lb 3.2 oz (51.3 kg)  ?02/17/21 113 lb 3.2 oz (51.3 kg)  ? ?Physical Exam ?Vitals and nursing note reviewed.  ?Constitutional:   ?   General: She is not in acute distress. ?   Appearance: Normal appearance. She is normal weight. She is not ill-appearing, toxic-appearing or diaphoretic.  ?Cardiovascular:  ?   Rate and Rhythm: Normal rate  and regular rhythm.  ?   Heart sounds: Normal heart sounds. No murmur heard. ?  No friction rub. No gallop.  ?Pulmonary:  ?   Effort: Pulmonary effort is normal. No respiratory distress.  ?   Breath sounds: Normal breath sounds. No stridor. No wheezing, rhonchi or rales.  ?Chest:  ?   Chest wall: No tenderness.  ?Skin: ?   General: Skin is warm and dry.  ?Neurological:  ?   General: No focal deficit present.  ?   Mental Status: She is alert and oriented to person, place, and time. Mental status is at baseline.  ?Psychiatric:     ?   Mood and Affect: Mood normal.     ?   Behavior: Behavior normal.     ?   Thought Content: Thought content normal.     ?   Judgment: Judgment normal.  ? ? ?No results found for any visits on 08/24/21. ? ? ? ?The 10-year ASCVD risk score (Arnett DK, et al., 2019) is: 8.2% ? ?  ?Assessment & Plan:   ? ?Problem List Items Addressed This Visit   ?None ?Visit Diagnoses   ? ? Elevated blood pressure reading in office without diagnosis of hypertension    -  Primary  ? Relevant Orders  ? TSH  ? Lipid panel  ? Comprehensive metabolic panel  ? EKG 12-Lead (Completed)  ? Ambulatory referral to Cardiology  ? Palpitations      ? Relevant Orders  ? TSH  ? Lipid panel  ? Comprehensive metabolic panel  ? EKG 12-Lead (Completed)  ? Ambulatory referral to Cardiology  ? Abnormal EKG      ? Relevant Orders  ? Ambulatory referral to Cardiology  ? ?  ? ? ?No orders of the defined types were placed in this encounter. ? ? ?Return in about 4 weeks (around 09/21/2021) for Nurse Visit BP check.  ? ?PLAN ?Pt generally feeling fine, will check labs ?Ekg obtained, no comparison available. Shows ST depression in multiple leads. Negative precordial t waves most prominent in V1 and V2. Question of biphasic wave in V3 but difficult to ascertain given low voltage - if so, pattern suggestive of hypokalemia rather than Wellens.  ?Will refer to cardiology ?Close monitoring with BP check nurse visit in 3-4 weeks ?Patient encouraged to call clinic with any questions, comments, or concerns. ? ? ?Nancy Coss, NP ?

## 2021-08-24 NOTE — Patient Instructions (Addendum)
Ms. Kovarik - ? ?Great to see you!  ? ?Keep a close eye on symptoms. Stay well hydrated, get back on your normal diet.  ? ?We'll get you in for a consult with cardiology. ? ?Let me know if symptoms persist or worsen ? ?Thanks, ? ?Rich  ? ? ?If you have lab work done today you will be contacted with your lab results within the next 2 weeks.  If you have not heard from [place] then please contact us. The fastest way to get your results is to register for My Chart. ? ? ?IF you received an x-ray today, you will receive an invoice from [facility]. Please contact [facility] at [phone] with questions or concerns regarding your invoice.  ? ?IF you received labwork today, you will receive an invoice from [facility]. Please contact [facility] at [phone] with questions or concerns regarding your invoice.  ? ?Our billing staff will not be able to assist you with questions regarding bills from these companies. ? ?You will be contacted with the lab results as soon as they are available. The fastest way to get your results is to activate your My Chart account. Instructions are located on the last page of this paperwork. If you have not heard from [place] regarding the results in 2 weeks, please contact this office. ?  ? ? ?

## 2021-08-25 ENCOUNTER — Ambulatory Visit (INDEPENDENT_AMBULATORY_CARE_PROVIDER_SITE_OTHER): Payer: Medicare PPO

## 2021-08-25 ENCOUNTER — Ambulatory Visit: Payer: Medicare PPO | Admitting: Internal Medicine

## 2021-08-25 ENCOUNTER — Encounter: Payer: Self-pay | Admitting: Internal Medicine

## 2021-08-25 VITALS — BP 136/78 | HR 50 | Ht 65.0 in | Wt 117.2 lb

## 2021-08-25 DIAGNOSIS — R002 Palpitations: Secondary | ICD-10-CM

## 2021-08-25 NOTE — Patient Instructions (Signed)
Medication Instructions:  ?Your physician recommends that you continue on your current medications as directed. Please refer to the Current Medication list given to you today. ? ?*If you need a refill on your cardiac medications before your next appointment, please call your pharmacy* ? ?Lab Work: ?NONE ordered at this time of appointment  ? ?If you have labs (blood work) drawn today and your tests are completely normal, you will receive your results only by: ?MyChart Message (if you have MyChart) OR ?A paper copy in the mail ?If you have any lab test that is abnormal or we need to change your treatment, we will call you to review the results. ? ? ?Testing/Procedures: ?ZIO XT- Long Term Monitor Instructions ? ?Your physician has requested you wear a ZIO patch monitor for 7 days.  ?This is a single patch monitor. [employer] supplies one patch monitor per enrollment. Additional ?stickers are not available. Please do not apply patch if you will be having a Nuclear Stress Test,  ?Echocardiogram, Cardiac CT, MRI, or Chest Xray during the period you would be wearing the  ?monitor. The patch cannot be worn during these tests. You cannot remove and re-apply the  ?ZIO XT patch monitor.  ?Your ZIO patch monitor will be mailed 3 day USPS to your address on file. It may take 3-5 days  ?to receive your monitor after you have been enrolled.  ?Once you have received your monitor, please review the enclosed instructions. Your monitor  ?has already been registered assigning a specific monitor serial # to you. ? ?Billing and Patient Assistance Program Information ? ?We have supplied [employer] with any of your insurance information on file for billing purposes. ?[employer] offers a sliding scale Patient Assistance Program for patients that do not have  ?insurance, or whose insurance does not completely cover the cost of the ZIO monitor.  ?You must apply for the Patient Assistance Program to qualify for this discounted rate.  ?To apply, please  call [employer] at [phone], select option 4, select option 2, ask to apply for  ?Patient Assistance Program. Theodore Demark will ask your household income, and how many people  ?are in your household. They will quote your out-of-pocket cost based on that information.  ?[employer] will also be able to set up a 67-month interest-free payment plan if needed. ? ?Applying the monitor ?  ?Shave hair from upper left chest.  ?Hold abrader disc by orange tab. Rub abrader in 40 strokes over the upper left chest as  ?indicated in your monitor instructions.  ?Clean area with 4 enclosed alcohol pads. Let dry.  ?Apply patch as indicated in monitor instructions. Patch will be placed under collarbone on left  ?side of chest with arrow pointing upward.  ?Rub patch adhesive wings for 2 minutes. Remove white label marked "1". Remove the white  ?label marked "2". Rub patch adhesive wings for 2 additional minutes.  ?While looking in a mirror, press and release button in center of patch. A small green light will  ?flash 3-4 times. This will be your only indicator that the monitor has been turned on.  ?Do not shower for the first 24 hours. You may shower after the first 24 hours.  ?Press the button if you feel a symptom. You will hear a small click. Record Date, Time and  ?Symptom in the Patient Logbook.  ?When you are ready to remove the patch, follow instructions on the last 2 pages of Patient  ?Logbook. Stick patch monitor onto the last page of  Patient Logbook.  ?Place Patient Logbook in the blue and white box. Use locking tab on box and tape box closed  ?securely. The blue and white box has prepaid postage on it. Please place it in the mailbox as  ?soon as possible. Your physician should have your test results approximately 7 days after the  ?monitor has been mailed back to [facility].  ?Call [facility] at [phone] if you have questions regarding  ?your ZIO XT patch monitor. Call them immediately if you see an  orange light blinking on your  ?monitor.  ?If your monitor falls off in less than 4 days, contact our Monitor department at [phone].  ?If your monitor becomes loose or falls off after 4 days call [employer] at [phone] for  ?suggestions on securing your monitor  ? ? ?Follow-Up: ?At [facility], you and your health needs are our priority.  As part of our continuing mission to provide you with exceptional heart care, we have created designated Provider Care Teams.  These Care Teams include your primary Cardiologist (physician) and Advanced Practice Providers (APPs -  Physician Assistants and Nurse Practitioners) who all work together to provide you with the care you need, when you need it. ? ?We recommend signing up for the patient portal called "MyChart".  Sign up information is provided on this After Visit Summary.  MyChart is used to connect with patients for Virtual Visits (Telemedicine).  Patients are able to view lab/test results, encounter notes, upcoming appointments, etc.  Non-urgent messages can be sent to your provider as well.   ?To learn more about what you can do with MyChart, go to [email or web address].   ? ?Your next appointment:   ?As Needed   ? ?The format for your next appointment:   ?In Person ? ?Provider:   ?Janina Mayo, MD   ? ? ?Other Instructions ? ? ?Important Information About Sugar ? ? ? ? ? ? ?

## 2021-08-25 NOTE — Progress Notes (Unsigned)
Enrolled patient for a 7 day Zio XT monitor to be mailed to patients home.  

## 2021-08-25 NOTE — Progress Notes (Signed)
?Cardiology Office Note:   ? ?Date:  08/25/2021  ? ?ID:  Nancy Smith, DOB 03/01/1956, MRN [id number] ? ?PCP:  Maximiano Coss, NP ?  ?[facility] Providers ?Cardiologist:  Janina Mayo, MD    ? ?Referring MD: Maximiano Coss, NP  ? ?No chief complaint on file. ?Palpitations ? ?History of Present Illness:   ? ?Nancy Smith is a 66 y.o. female with a hx of HLD, anxiety on xanax,  referral for palpitations ? ?She notes heart palpitations. This was waking her up at night. Low energy. No persistent snoring or apnea. No LH or dizziness. Mother had CABG, passed of cancer. Father had heart disease older. Brother had CABG and has CHF, 2/2 lifestyle.  She drinks a few cups of coffee in the AM. She takes trazadone at night and as needed xanax. ? ?EKG: sinus bradycardia 48 bpm ?TSH normal ?Normal renal function ? ?The 10-year ASCVD risk score (Arnett DK, et al., 2019) is: 7.1% ?  Values used to calculate the score: ?    Age: 81 years ?    Sex: Female ?    Is Non-Hispanic African American: No ?    Diabetic: No ?    Tobacco smoker: No ?    Systolic Blood Pressure: 101 mmHg ?    Is BP treated: No ?    HDL Cholesterol: 81.5 mg/dL ?    Total Cholesterol: 224 mg/dL ? ? ?Past Medical History:  ?Diagnosis Date  ? Colitis   ? 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 Hx of appendicitis   ? Hyperlipidemia   ? ? ?Past Surgical History:  ?Procedure Laterality Date  ? APPENDECTOMY    ? EYE SURGERY  1996  ? HERNIA REPAIR  1983  ? Femoral  ? MOHS SURGERY    ? for Melanoma  ? TONSILLECTOMY  1960  ? ? ?Current Medications: ?Current Outpatient Medications on File Prior to Visit  ?Medication Sig Dispense Refill  ? ALPRAZolam (XANAX) 0.5 MG tablet Take 1 tablet (0.5 mg total) by mouth 2 (two) times daily as needed for anxiety. 60 tablet 1  ? budesonide (ENTOCORT EC) 3 MG 24 hr capsule Take by mouth daily.    ? buPROPion (WELLBUTRIN XL) 300 MG 24 hr tablet Take 1 tablet (300 mg total) by mouth daily. 90 tablet 1  ? calcium  carbonate (TUMS EX) 750 MG chewable tablet Chew 1 tablet by mouth daily.    ? cetirizine (ZYRTEC ALLERGY) 10 MG tablet Take 1 tablet (10 mg total) by mouth daily. 90 tablet 3  ? meloxicam (MOBIC) 7.5 MG tablet Take 1 tablet (7.5 mg total) by mouth daily. 30 tablet 3  ? omeprazole (PRILOSEC) 20 MG capsule Take 2 capsules (40 mg total) by mouth daily. (Patient taking differently: Take 40 mg by mouth as needed.) 90 capsule 1  ? sertraline (ZOLOFT) 100 MG tablet Take 1 tablet (100 mg total) by mouth daily. 90 tablet 1  ? sucralfate (CARAFATE) 1 GM/10ML suspension Take 10 mLs (1 g total) by mouth 4 (four) times daily -  with meals and at bedtime. 420 mL 0  ? traZODone (DESYREL) 50 MG tablet TAKE 1 TABLET(50 MG) BY MOUTH AT BEDTIME AS NEEDED FOR SLEEP 90 tablet 1  ? ?No current facility-administered medications on file prior to visit.  ? ? ? ?Allergies:   Latex and Strattera [atomoxetine]  ? ?Social History  ? ?Socioeconomic History  ? Marital status: Married  ?  Spouse name: Not on  file  ? Number of children: Not on file  ? Years of education: Not on file  ? Highest education level: Master's degree (e.g., MA, MS, MEng, MEd, MSW, MBA)  ?Occupational History  ? Not on file  ?Tobacco Use  ? Smoking status: Former  ?  Packs/day: 0.25  ?  Years: 15.00  ?  Pack years: 3.75  ?  Types: Cigarettes  ?  Quit date: 65  ?  Years since quitting: 29.3  ? Smokeless tobacco: Never  ?Vaping Use  ? Vaping Use: Never used  ?Substance and Sexual Activity  ? Alcohol use: Yes  ?  Comment: occasionally  ? Drug use: Never  ? Sexual activity: Not on file  ?Other Topics Concern  ? Not on file  ?Social History Narrative  ? Not on file  ? ?Social Determinants of Health  ? ?Financial Resource Strain: Not on file  ?Food Insecurity: Not on file  ?Transportation Needs: Not on file  ?Physical Activity: Not on file  ?Stress: Not on file  ?Social Connections: Not on file  ?  ? ?Family History: ?The patient's family history includes Atrial fibrillation in  her father; CAD in her mother; Congestive Heart Failure in her brother; Heart disease in her father; Heart failure in her father and mother; Hyperlipidemia in her father; Lung cancer in her mother; Osteoporosis in her mother. ? ?ROS:   ?Please see the history of present illness.    ? All other systems reviewed and are negative. ? ?EKGs/Labs/Other Studies Reviewed:   ? ?The following studies were reviewed today: ? ? ?EKG:  EKG is  ordered today.  The ekg ordered today demonstrates  ? ?Recent Labs: ?03/01/2021: Hemoglobin 12.4; Platelets 238.0 ?08/24/2021: ALT 14; BUN 11; Creatinine, Ser 0.84; Potassium 3.8; Sodium 139; TSH 1.93  ? ?Recent Lipid Panel ?   ?Component Value Date/Time  ? CHOL 224 (H) 08/24/2021 1316  ? TRIG 65.0 08/24/2021 1316  ? HDL 81.50 08/24/2021 1316  ? CHOLHDL 3 08/24/2021 1316  ? VLDL 13.0 08/24/2021 1316  ? LDLCALC 129 (H) 08/24/2021 1316  ? ? ? ?Risk Assessment/Calculations:   ?  ? ?    ? ?Physical Exam:   ? ?VS:  ? ?Vitals:  ? 08/25/21 1501  ?BP: 136/78  ?Pulse: (!) 50  ?SpO2: 98%  ? ? ? ?Wt Readings from Last 3 Encounters:  ?08/25/21 117 lb 3.2 oz (53.2 kg)  ?08/24/21 117 lb 9.6 oz (53.3 kg)  ?03/01/21 113 lb 3.2 oz (51.3 kg)  ?  ? ?GEN:  Well nourished, well developed in no acute distress ?HEENT: Normal ?NECK: No JVD; No carotid bruits ?LYMPHATICS: No lymphadenopathy ?CARDIAC: RRR, no murmurs, rubs, gallops ?RESPIRATORY:  Clear to auscultation without rales, wheezing or rhonchi  ?ABDOMEN: Soft, non-tender, non-distended ?MUSCULOSKELETAL:  No edema; No deformity  ?SKIN: Warm and dry ?NEUROLOGIC:  Alert and oriented x 3 ?PSYCHIATRIC:  Normal affect  ? ?ASSESSMENT:   ?Palpitations: 7 day ziopatch. She has sinus bradycardia. As long has HR augment this is not an issue. Will assess for afib ? ?HTN: not on any medication. Primary provider is planning for surveilalnce. If persistent hypertension recommend starting therapy. Can start chlorthalidone with goal < 130/80 mmHg.  ? ?HLD: can re-assess  ASCVD. If > 7.5% recommend starting statin therapy ? ?PLAN:   ? ?In order of problems listed above: ? ?7 day ziopatch ?Follow up if ziopatch is abnormal ? ?   ? ?Medication Adjustments/Labs and Tests Ordered: ?Current medicines are reviewed  at length with the patient today.  Concerns regarding medicines are outlined above.  ?Orders Placed This Encounter  ?Procedures  ? LONG TERM MONITOR (3-14 DAYS)  ? EKG 12-Lead  ? ?No orders of the defined types were placed in this encounter. ? ? ?Patient Instructions  ?Medication Instructions:  ?Your physician recommends that you continue on your current medications as directed. Please refer to the Current Medication list given to you today. ? ?*If you need a refill on your cardiac medications before your next appointment, please call your pharmacy* ? ?Lab Work: ?NONE ordered at this time of appointment  ? ?If you have labs (blood work) drawn today and your tests are completely normal, you will receive your results only by: ?MyChart Message (if you have MyChart) OR ?A paper copy in the mail ?If you have any lab test that is abnormal or we need to change your treatment, we will call you to review the results. ? ? ?Testing/Procedures: ?ZIO XT- Long Term Monitor Instructions ? ?Your physician has requested you wear a ZIO patch monitor for 7 days.  ?This is a single patch monitor. [employer] supplies one patch monitor per enrollment. Additional ?stickers are not available. Please do not apply patch if you will be having a Nuclear Stress Test,  ?Echocardiogram, Cardiac CT, MRI, or Chest Xray during the period you would be wearing the  ?monitor. The patch cannot be worn during these tests. You cannot remove and re-apply the  ?ZIO XT patch monitor.  ?Your ZIO patch monitor will be mailed 3 day USPS to your address on file. It may take 3-5 days  ?to receive your monitor after you have been enrolled.  ?Once you have received your monitor, please review the enclosed instructions. Your monitor   ?has already been registered assigning a specific monitor serial # to you. ? ?Billing and Patient Assistance Program Information ? ?We have supplied [employer] with any of your insurance information on file for

## 2021-09-01 ENCOUNTER — Encounter: Payer: Self-pay | Admitting: Family Medicine

## 2021-09-01 ENCOUNTER — Telehealth (INDEPENDENT_AMBULATORY_CARE_PROVIDER_SITE_OTHER): Payer: Medicare PPO | Admitting: Family Medicine

## 2021-09-01 DIAGNOSIS — U071 COVID-19: Secondary | ICD-10-CM

## 2021-09-01 MED ORDER — MOLNUPIRAVIR EUA 200MG CAPSULE: 4.0000 | ORAL_CAPSULE | Freq: Two times a day (BID) | ORAL | 0 refills | Status: AC

## 2021-09-01 NOTE — Progress Notes (Signed)
Virtual Visit via Video   I connected with patient on 09/01/21 at  1:40 PM EDT by a video enabled telemedicine application and verified that I am speaking with the correct person using two identifiers.  Location patient: Home Location provider: [facility], Office Persons participating in the virtual visit: Patient, Provider, Garrison Marcille Blanco C)  I discussed the limitations of evaluation and management by telemedicine and the availability of in person appointments. The patient expressed understanding and agreed to proceed.  Subjective:   HPI:   COVID- pt tested + today.  Has been isolating at her father's house.  She was visiting Friday- Sunday and he wasn't feeling well.  Both he and his wife have COVID.  + HAs, chills, has very faint perioral rash.  Pt reports firm areas under the skin along R check and back of her head.  Denies cough, congestion, SOB.  ROS:   See pertinent positives and negatives per HPI.  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   Osteopenia 09/09/2019   Dense breast tissue on mammogram 09/09/2019   Hypercholesterolemia 09/09/2019   History of colon polyps 09/09/2019   Hemangioma of liver 09/09/2019   Hepatic cyst 09/09/2019   History of posterior vitreous detachment 09/09/2019   Age-related cataract of both eyes 09/09/2019   Anxiety and depression 09/02/2019   Gastroesophageal reflux disease 09/02/2019    Social History   Tobacco Use   Smoking status: Former    Packs/day: 0.25    Years: 15.00    Pack years: 3.75    Types: Cigarettes    Quit date: 1994    Years since quitting: 29.4   Smokeless tobacco: Never  Substance Use Topics   Alcohol use: Yes    Comment: occasionally    Current Outpatient Medications:    ALPRAZolam (XANAX) 0.5 MG tablet, Take 1 tablet (0.5 mg total) by mouth 2 (two) times  daily as needed for anxiety., Disp: 60 tablet, Rfl: 1   budesonide (ENTOCORT EC) 3 MG 24 hr capsule, Take by mouth daily., Disp: , Rfl:    buPROPion (WELLBUTRIN XL) 300 MG 24 hr tablet, Take 1 tablet (300 mg total) by mouth daily., Disp: 90 tablet, Rfl: 1   calcium carbonate (TUMS EX) 750 MG chewable tablet, Chew 1 tablet by mouth daily., Disp: , Rfl:    cetirizine (ZYRTEC ALLERGY) 10 MG tablet, Take 1 tablet (10 mg total) by mouth daily., Disp: 90 tablet, Rfl: 3   meloxicam (MOBIC) 7.5 MG tablet, Take 1 tablet (7.5 mg total) by mouth daily., Disp: 30 tablet, Rfl: 3   omeprazole (PRILOSEC) 20 MG capsule, Take 2 capsules (40 mg total) by mouth daily. (Patient taking differently: Take 40 mg by mouth as needed.), Disp: 90 capsule, Rfl: 1   sertraline (ZOLOFT) 100 MG tablet, Take 1 tablet (100 mg total) by mouth daily., Disp: 90 tablet, Rfl: 1   sucralfate (CARAFATE) 1 GM/10ML suspension, Take 10 mLs (1 g total) by mouth 4 (four) times daily -  with meals and at bedtime., Disp: 420 mL, Rfl: 0   traZODone (DESYREL) 50 MG tablet, TAKE 1 TABLET(50 MG) BY MOUTH AT BEDTIME AS NEEDED FOR SLEEP, Disp: 90 tablet, Rfl: 1  Allergies  Allergen Reactions   Latex    Strattera [Atomoxetine] Palpitations    Objective:   There were no vitals taken for this  visit. AAOx3, NAD NCAT, EOMI No obvious CN deficits Coloring WNL Pt is able to speak clearly, coherently without shortness of breath or increased work of breathing.  Thought process is linear.  Mood is appropriate.   Assessment and Plan:   COVID- new.  Pt tested + today after both her father and his wife were positive this weekend while she was visiting.  She developed a HA, cervical LAD, chills, body aches.  Her husband is 21 and she would like to return home as soon as possible.  B/c of this, will start molnupiravir.  Discussed appropriate use, possible side effects, and dose.  Encouraged pt to rest, drink plenty of fluids, tylenol/ibuprofen as needed  for headaches/body aches/fever.  She is aware that she can go home on day 5 but should mask until at least day 10.  Pt expressed understanding and is in agreement w/ plan.    Annye Asa, MD 09/01/2021

## 2021-09-07 DIAGNOSIS — R002 Palpitations: Secondary | ICD-10-CM | POA: Diagnosis not present

## 2021-09-09 ENCOUNTER — Telehealth: Payer: Self-pay | Admitting: Registered Nurse

## 2021-09-09 NOTE — Telephone Encounter (Signed)
Pt called in stating that she still is having a low gd fever and cough. She states that she took another covid test today and it was positive again. She wanted to know how long she should wear her mask and stay aware from her husband.   Please advise

## 2021-09-09 NOTE — Telephone Encounter (Signed)
Called and made patient aware of this. Patient understood

## 2021-09-09 NOTE — Telephone Encounter (Signed)
As long as she masks and handwashes frequently, she should be fine to resume all normal activities through Saturday. Starting Sunday, no mask needed. Residual positive test is likely not indicative of transmissibility   Thanks,  Rich

## 2021-09-13 ENCOUNTER — Other Ambulatory Visit: Payer: Self-pay | Admitting: Registered Nurse

## 2021-09-13 DIAGNOSIS — F339 Major depressive disorder, recurrent, unspecified: Secondary | ICD-10-CM

## 2021-09-13 NOTE — Telephone Encounter (Signed)
Patient is requesting a refill of the following medications: Requested Prescriptions   Pending Prescriptions Disp Refills   buPROPion (WELLBUTRIN XL) 300 MG 24 hr tablet [Pharmacy Med Name: BUPROPION XL '300MG'$  TABLETS] 90 tablet 1    Sig: TAKE 1 TABLET(300 MG) BY MOUTH DAILY    Date of patient request: 09/13/2021 Last office visit: 08/24/2021 Date of last refill: 03/01/2021 Last refill amount: 90 tablets 1 refill Follow up time period per chart: n/a

## 2021-09-20 ENCOUNTER — Ambulatory Visit
Admission: RE | Admit: 2021-09-20 | Discharge: 2021-09-20 | Disposition: A | Discharge status: Home / Self Care | Payer: Medicare PPO | Source: Ambulatory Visit | Attending: Registered Nurse | Admitting: Registered Nurse

## 2021-09-20 DIAGNOSIS — Z1231 Encounter for screening mammogram for malignant neoplasm of breast: Secondary | ICD-10-CM | POA: Diagnosis not present

## 2021-09-21 ENCOUNTER — Telehealth: Payer: Self-pay | Admitting: Registered Nurse

## 2021-09-21 ENCOUNTER — Other Ambulatory Visit: Payer: Self-pay | Admitting: Registered Nurse

## 2021-09-21 DIAGNOSIS — R928 Other abnormal and inconclusive findings on diagnostic imaging of breast: Secondary | ICD-10-CM

## 2021-09-21 NOTE — Telephone Encounter (Signed)
Pt stating that she just had a mammogram done. The breast center called pt to come back in for another mammogram of her right breast. Pt want to know if she should wait until [name] read it 1st or to go head and make another appt.    PT KNOWS [name] WILL NOT BE BACK IN THE OFFICE UNTIL 10/06/2021.

## 2021-09-22 NOTE — Telephone Encounter (Signed)
Called pt no answer, LM letting her know to call back and schedule and Nancy Smith will receive results upon his return.   He will typically proceed with recommended imaging such as these.

## 2021-09-23 ENCOUNTER — Other Ambulatory Visit: Payer: Self-pay | Admitting: Registered Nurse

## 2021-09-23 DIAGNOSIS — M79642 Pain in left hand: Secondary | ICD-10-CM

## 2021-09-24 NOTE — Telephone Encounter (Signed)
Discussed in September 2022 with PCP, refill ordered.

## 2021-09-24 NOTE — Telephone Encounter (Signed)
Patient is requesting a refill of the following medications: Requested Prescriptions   Pending Prescriptions Disp Refills   meloxicam (MOBIC) 7.5 MG tablet [Pharmacy Med Name: MELOXICAM 7.'5MG'$  TABLETS] 30 tablet 3    Sig: TAKE 1 TABLET(7.5 MG) BY MOUTH DAILY    Date of patient request: 09/24/21 Last office visit: 08/24/21 Date of last refill: 12/11/20 Last refill amount: 30

## 2021-09-28 ENCOUNTER — Ambulatory Visit
Admission: RE | Admit: 2021-09-28 | Discharge: 2021-09-28 | Disposition: A | Discharge status: Home / Self Care | Payer: Medicare PPO | Source: Ambulatory Visit | Attending: Registered Nurse | Admitting: Registered Nurse

## 2021-09-28 ENCOUNTER — Telehealth: Payer: Self-pay | Admitting: Registered Nurse

## 2021-09-28 DIAGNOSIS — R928 Other abnormal and inconclusive findings on diagnostic imaging of breast: Secondary | ICD-10-CM

## 2021-09-28 NOTE — Telephone Encounter (Signed)
I see the order in the chart, but do not see a way to [place]. Called [facility]. They will send fax and I will sign, fax back.

## 2021-09-28 NOTE — Telephone Encounter (Signed)
(  [name] pt) [facility] called b/c they need a co-signer for this Diagnostic Image for this pt. Pt appt is TODAY at 1:50 pm.

## 2021-09-28 NOTE — Telephone Encounter (Signed)
Are you able to co-sign this order in [place]? She is scheduled for 1:50pm I see the order in the chart  Please advise

## 2021-09-28 NOTE — Telephone Encounter (Signed)
Order faxed.

## 2021-10-19 ENCOUNTER — Other Ambulatory Visit: Payer: Self-pay | Admitting: Registered Nurse

## 2021-10-19 DIAGNOSIS — F419 Anxiety disorder, unspecified: Secondary | ICD-10-CM

## 2021-10-19 NOTE — Telephone Encounter (Signed)
Patient is requesting a refill of the following medications: Requested Prescriptions   Pending Prescriptions Disp Refills   ALPRAZolam (XANAX) 0.5 MG tablet 60 tablet 1    Sig: Take 1 tablet (0.5 mg total) by mouth 2 (two) times daily as needed for anxiety.    Date of patient request: 10/19/21 Last office visit: 08/24/21 Date of last refill: 08/19/21 Last refill amount: 60

## 2021-10-19 NOTE — Telephone Encounter (Signed)
Encourage patient to contact the pharmacy for refills or they can request refills through [facility]  (Please schedule appointment if patient has not been seen in over a year)    WHAT [place] TO: [facility]  MEDICATION NAME & DOSE: alprazolam 0.'5mg'$    NOTES/COMMENTS FROM PATIENT:       [place] office please notify patient: It takes 48-72 hours to process rx refill requests Ask patient to call pharmacy to ensure rx is ready before heading there.

## 2021-10-20 MED ORDER — ALPRAZOLAM 0.5 MG PO TABS: 0.5000 mg | ORAL_TABLET | Freq: Two times a day (BID) | ORAL | 2 refills | Status: DC | PRN

## 2021-10-20 NOTE — Telephone Encounter (Signed)
Last visit 08/24/21 PDMP shows last fill on 09/20/21 for #60 (30 day supply)  Does have tx for tramadol for pet, otherwise, no concerns on PDMP  Continuing to have discussions with patient about tapering. She is amenable to this in coming visits   Kathrin Ruddy, NP

## 2021-11-02 ENCOUNTER — Telehealth: Payer: Self-pay | Admitting: Registered Nurse

## 2021-11-02 NOTE — Telephone Encounter (Signed)
Caller name: Arielis Leonhart   On DPR? :yes/no: Yes  Call back [phone]  Provider they see: Orland Mustard   Reason for call:  Pt has a upcoming appt with Orland Mustard on 11/08/21. Pt states that she need to talk to morrow about her heart monitor.

## 2021-11-03 NOTE — Telephone Encounter (Signed)
Yes - anything to do with cardiac monitor should be addressed to ordering physician - I unfortunately know very little about these.  Thanks, Denice Paradise

## 2021-11-04 NOTE — Telephone Encounter (Signed)
Attempted to call patient Lvm to return call if she have any concerns after speaking with cardiology.

## 2021-11-04 NOTE — Telephone Encounter (Signed)
Pt returned Brooke's call.  Pt has an appointment on Monday with [name] and stated that she will just discuss at that time after speaking to cardiology.

## 2021-11-08 ENCOUNTER — Other Ambulatory Visit: Payer: Self-pay

## 2021-11-08 ENCOUNTER — Ambulatory Visit
Admission: RE | Admit: 2021-11-08 | Discharge: 2021-11-08 | Disposition: A | Discharge status: Home / Self Care | Payer: Medicare PPO | Source: Ambulatory Visit | Attending: Registered Nurse | Admitting: Registered Nurse

## 2021-11-08 ENCOUNTER — Encounter: Payer: Self-pay | Admitting: Registered Nurse

## 2021-11-08 ENCOUNTER — Ambulatory Visit: Payer: Medicare PPO | Admitting: Registered Nurse

## 2021-11-08 VITALS — BP 124/90 | HR 75 | Temp 98.0°F | Resp 18 | Ht 65.0 in | Wt 116.2 lb

## 2021-11-08 DIAGNOSIS — M79662 Pain in left lower leg: Secondary | ICD-10-CM

## 2021-11-08 DIAGNOSIS — F339 Major depressive disorder, recurrent, unspecified: Secondary | ICD-10-CM

## 2021-11-08 DIAGNOSIS — F419 Anxiety disorder, unspecified: Secondary | ICD-10-CM | POA: Diagnosis not present

## 2021-11-08 DIAGNOSIS — F32A Depression, unspecified: Secondary | ICD-10-CM | POA: Diagnosis not present

## 2021-11-08 DIAGNOSIS — J302 Other seasonal allergic rhinitis: Secondary | ICD-10-CM | POA: Diagnosis not present

## 2021-11-08 DIAGNOSIS — K219 Gastro-esophageal reflux disease without esophagitis: Secondary | ICD-10-CM | POA: Diagnosis not present

## 2021-11-08 MED ORDER — TRAMADOL HCL 50 MG PO TABS: 50.0000 mg | ORAL_TABLET | Freq: Three times a day (TID) | ORAL | 0 refills | Status: AC | PRN

## 2021-11-08 MED ORDER — BUPROPION HCL ER (XL) 300 MG PO TB24
ORAL_TABLET | ORAL | 1 refills | Status: DC
Start: 2021-11-08 — End: 2022-03-25

## 2021-11-08 MED ORDER — CETIRIZINE HCL 10 MG PO TABS: 10.0000 mg | ORAL_TABLET | Freq: Every day | ORAL | 3 refills | Status: DC

## 2021-11-08 MED ORDER — MELOXICAM 7.5 MG PO TABS
ORAL_TABLET | ORAL | 3 refills | Status: DC
Start: 2021-11-08 — End: 2022-01-10

## 2021-11-08 MED ORDER — ALPRAZOLAM 0.5 MG PO TABS: 0.5000 mg | ORAL_TABLET | Freq: Two times a day (BID) | ORAL | 2 refills | Status: DC | PRN

## 2021-11-08 MED ORDER — SERTRALINE HCL 100 MG PO TABS: 100.0000 mg | ORAL_TABLET | Freq: Every day | ORAL | 1 refills | Status: DC

## 2021-11-08 MED ORDER — OMEPRAZOLE 20 MG PO CPDR: 40.0000 mg | DELAYED_RELEASE_CAPSULE | ORAL | 1 refills | Status: DC | PRN

## 2021-11-08 NOTE — Assessment & Plan Note (Signed)
Continue prn ppi.

## 2021-11-08 NOTE — Assessment & Plan Note (Signed)
Continue sertraline, bupropion, and prn alprazolam. She will follow up in 6 mo with new pcp

## 2021-11-08 NOTE — Assessment & Plan Note (Signed)
Injured while exercising. Concern for stress fx. Will obtain xray. Tramadol for pain. Advised RICE.

## 2021-11-08 NOTE — Assessment & Plan Note (Signed)
Continue anithistamine

## 2021-11-08 NOTE — Progress Notes (Signed)
Established Patient Office Visit  Subjective:  Patient ID: Nancy Smith, female    DOB: [birth year]  Age: 66 y.o. MRN: [id number]  CC:  Chief Complaint  Patient presents with   Follow-up    Patient states she is here for BP and Cholesterol and a lump on neck      HPI Nancy Smith presents for htn, hld, neck lump  Hypertension: Patient Currently taking: lifestyle control Good effect. No AEs. Denies CV symptoms including: chest pain, shob, doe, headache, visual changes, fatigue, claudication, and dependent edema.   Home numbers are elevated, 130s-150s/90s.   Previous readings and labs: BP Readings from Last 3 Encounters:  11/08/21 124/90  08/25/21 136/78  08/24/21 (!) 142/82   Lab Results  Component Value Date   CREATININE 0.84 08/24/2021    HLD Pursuing lifestyle control Has declined statins in past with fear for AE Lab Results  Component Value Date   CHOL 224 (H) 08/24/2021   HDL 81.50 08/24/2021   LDLCALC 129 (H) 08/24/2021   TRIG 65.0 08/24/2021   CHOLHDL 3 08/24/2021    Neck Mass Noted over weekend. No pain, mobile. Did have covid a number of weeks ago, but has resolved. No other upper respiratory symptoms. No dysphagia.  Leg Pain Injured at gym on Wednesday. L anterior shin.  Has iced and compressed over weekend, improved. Not resolved. She did take someone else's rx pain medication, it helped one day but did not on the other day.  Outpatient Medications Prior to Visit  Medication Sig Dispense Refill   budesonide (ENTOCORT EC) 3 MG 24 hr capsule Take by mouth daily.     calcium carbonate (TUMS EX) 750 MG chewable tablet Chew 1 tablet by mouth daily.     sucralfate (CARAFATE) 1 GM/10ML suspension Take 10 mLs (1 g total) by mouth 4 (four) times daily -  with meals and at bedtime. 420 mL 0   ALPRAZolam (XANAX) 0.5 MG tablet Take 1 tablet (0.5 mg total) by mouth 2 (two) times daily as needed for anxiety. 60 tablet 2   buPROPion (WELLBUTRIN XL) 300 MG  24 hr tablet TAKE 1 TABLET(300 MG) BY MOUTH DAILY 90 tablet 1   cetirizine (ZYRTEC ALLERGY) 10 MG tablet Take 1 tablet (10 mg total) by mouth daily. 90 tablet 3   meloxicam (MOBIC) 7.5 MG tablet TAKE 1 TABLET(7.5 MG) BY MOUTH DAILY 30 tablet 3   omeprazole (PRILOSEC) 20 MG capsule Take 2 capsules (40 mg total) by mouth daily. (Patient taking differently: Take 40 mg by mouth as needed.) 90 capsule 1   sertraline (ZOLOFT) 100 MG tablet Take 1 tablet (100 mg total) by mouth daily. 90 tablet 1   traZODone (DESYREL) 50 MG tablet TAKE 1 TABLET(50 MG) BY MOUTH AT BEDTIME AS NEEDED FOR SLEEP (Patient not taking: Reported on 11/08/2021) 90 tablet 1   No facility-administered medications prior to visit.    Review of Systems  Constitutional: Negative.   HENT: Negative.    Eyes: Negative.   Respiratory: Negative.    Cardiovascular: Negative.   Gastrointestinal: Negative.   Endocrine: Negative.   Genitourinary: Negative.   Musculoskeletal:  Positive for arthralgias.  Skin: Negative.   Allergic/Immunologic: Negative.   Neurological: Negative.   Hematological: Negative.   Psychiatric/Behavioral: Negative.    All other systems reviewed and are negative.     Objective:     BP 124/90   Pulse 75   Temp 98 F (36.7 C) (Temporal)   Resp 18  Ht '5\' 5"'$  (1.651 m)   Wt 116 lb 3.2 oz (52.7 kg)   SpO2 98%   BMI 19.34 kg/m   Wt Readings from Last 3 Encounters:  11/08/21 116 lb 3.2 oz (52.7 kg)  08/25/21 117 lb 3.2 oz (53.2 kg)  08/24/21 117 lb 9.6 oz (53.3 kg)   Physical Exam Vitals and nursing note reviewed.  Constitutional:      General: She is not in acute distress.    Appearance: Normal appearance. She is normal weight. She is not ill-appearing, toxic-appearing or diaphoretic.  HENT:     Head: Normocephalic and atraumatic.  Neck:     Vascular: No carotid bruit.  Cardiovascular:     Rate and Rhythm: Normal rate and regular rhythm.     Heart sounds: Normal heart sounds. No murmur  heard.    No friction rub. No gallop.  Pulmonary:     Effort: Pulmonary effort is normal. No respiratory distress.     Breath sounds: Normal breath sounds. No stridor. No wheezing, rhonchi or rales.  Chest:     Chest wall: No tenderness.  Musculoskeletal:        General: Tenderness (bony tenderness anterior L shin) present. No swelling, deformity or signs of injury. Normal range of motion.     Cervical back: Normal range of motion and neck supple. No rigidity or tenderness.     Right lower leg: No edema.     Left lower leg: No edema.  Lymphadenopathy:     Cervical: No cervical adenopathy.  Skin:    General: Skin is warm and dry.  Neurological:     General: No focal deficit present.     Mental Status: She is alert and oriented to person, place, and time. Mental status is at baseline.  Psychiatric:        Mood and Affect: Mood normal.        Behavior: Behavior normal.        Thought Content: Thought content normal.        Judgment: Judgment normal.     No results found for any visits on 11/08/21.    The 10-year ASCVD risk score (Arnett DK, et al., 2019) is: 6.8%    Assessment & Plan:   Problem List Items Addressed This Visit       Digestive   Gastroesophageal reflux disease (Chronic)    Continue prn ppi.       Relevant Medications   omeprazole (PRILOSEC) 20 MG capsule     Other   Anxiety and depression (Chronic)    Continue sertraline, bupropion, and prn alprazolam. She will follow up in 6 mo with new pcp       Relevant Medications   ALPRAZolam (XANAX) 0.5 MG tablet   buPROPion (WELLBUTRIN XL) 300 MG 24 hr tablet   sertraline (ZOLOFT) 100 MG tablet   Pain in left shin - Primary    Injured while exercising. Concern for stress fx. Will obtain xray. Tramadol for pain. Advised RICE.      Relevant Medications   traMADol (ULTRAM) 50 MG tablet   meloxicam (MOBIC) 7.5 MG tablet   Other Relevant Orders   DG Tibia/Fibula Left   Seasonal allergies    Continue  anithistamine      Relevant Medications   cetirizine (ZYRTEC ALLERGY) 10 MG tablet   Depression, recurrent (HCC)    Continue sertraline, bupropion, and prn alprazolam. She will follow up in 6 mo with new pcp  Relevant Medications   ALPRAZolam (XANAX) 0.5 MG tablet   buPROPion (WELLBUTRIN XL) 300 MG 24 hr tablet   sertraline (ZOLOFT) 100 MG tablet    Meds ordered this encounter  Medications   traMADol (ULTRAM) 50 MG tablet    Sig: Take 1 tablet (50 mg total) by mouth every 8 (eight) hours as needed for up to 5 days.    Dispense:  15 tablet    Refill:  0    Order Specific Question:   Supervising Provider    Answer:   Carlota Raspberry, JEFFREY R [[id number]]   ALPRAZolam (XANAX) 0.5 MG tablet    Sig: Take 1 tablet (0.5 mg total) by mouth 2 (two) times daily as needed for anxiety.    Dispense:  60 tablet    Refill:  2    Order Specific Question:   Supervising Provider    Answer:   Carlota Raspberry, JEFFREY R [[id number]]   buPROPion (WELLBUTRIN XL) 300 MG 24 hr tablet    Sig: TAKE 1 TABLET(300 MG) BY MOUTH DAILY    Dispense:  90 tablet    Refill:  1    Order Specific Question:   Supervising Provider    Answer:   Carlota Raspberry, JEFFREY R [[id number]]   cetirizine (ZYRTEC ALLERGY) 10 MG tablet    Sig: Take 1 tablet (10 mg total) by mouth daily.    Dispense:  90 tablet    Refill:  3    Order Specific Question:   Supervising Provider    Answer:   Carlota Raspberry, JEFFREY R [[id number]]   meloxicam (MOBIC) 7.5 MG tablet    Sig: TAKE 1 TABLET(7.5 MG) BY MOUTH DAILY    Dispense:  30 tablet    Refill:  3    Order Specific Question:   Supervising Provider    Answer:   Carlota Raspberry, JEFFREY R [[id number]]   omeprazole (PRILOSEC) 20 MG capsule    Sig: Take 2 capsules (40 mg total) by mouth as needed.    Dispense:  90 capsule    Refill:  1    Order Specific Question:   Supervising Provider    Answer:   Carlota Raspberry, JEFFREY R [[id number]]   sertraline (ZOLOFT) 100 MG tablet    Sig: Take 1 tablet (100 mg total) by mouth daily.    Dispense:  90 tablet     Refill:  1    Order Specific Question:   Supervising Provider    Answer:   Carlota Raspberry, JEFFREY R [[id number]]    Return if symptoms worsen or fail to improve.    Maximiano Coss, NP

## 2021-11-08 NOTE — Patient Instructions (Addendum)
Ms. Sieanna Vanstone to see you!  We can recheck labs.  Blood pressure is a shade higher than we'd like it, but not acutely concerning. Come in for a blood pressure check - bring your home cuff - in 2-3 weeks.   I recommend seeing one of these providers in 3 months for your physical: Inda Coke, [place] Berniece Pap, MD Myrna Blazer Early, NP Jeralyn Ruths, DNP  Head to [facility] at [address] for your xray today or tomorrow. Walk ins are ok. I will follow up with results.  Stay well!  Thank you for letting me take part in your care,  Rich    If you have lab work done today you will be contacted with your lab results within the next 2 weeks.  If you have not heard from [place] then please contact us. The fastest way to get your results is to register for My Chart.   IF you received an x-ray today, you will receive an invoice from [facility]. Please contact [facility] at [phone] with questions or concerns regarding your invoice.   IF you received labwork today, you will receive an invoice from [facility]. Please contact [facility] at [phone] with questions or concerns regarding your invoice.   Our billing staff will not be able to assist you with questions regarding bills from these companies.  You will be contacted with the lab results as soon as they are available. The fastest way to get your results is to activate your My Chart account. Instructions are located on the last page of this paperwork. If you have not heard from [place] regarding the results in 2 weeks, please contact this office.

## 2022-01-10 ENCOUNTER — Encounter: Payer: Self-pay | Admitting: Family Medicine

## 2022-01-10 ENCOUNTER — Ambulatory Visit: Payer: Medicare PPO | Admitting: Family Medicine

## 2022-01-10 VITALS — BP 124/80 | HR 60 | Temp 97.4°F | Resp 16 | Ht 65.0 in | Wt 121.5 lb

## 2022-01-10 DIAGNOSIS — F1010 Alcohol abuse, uncomplicated: Secondary | ICD-10-CM | POA: Insufficient documentation

## 2022-01-10 DIAGNOSIS — F419 Anxiety disorder, unspecified: Secondary | ICD-10-CM | POA: Diagnosis not present

## 2022-01-10 DIAGNOSIS — F32A Depression, unspecified: Secondary | ICD-10-CM | POA: Diagnosis not present

## 2022-01-10 HISTORY — DX: Alcohol abuse, uncomplicated: F10.10

## 2022-01-10 NOTE — Assessment & Plan Note (Signed)
New.  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  She initially began by sneaking drinks here and there and then things spiraled to the point she was drinking until she blacked out.  She feels like she was self medicating with alcohol, trying to feel 'something'.  Mom was an alcoholic and pt knows she wants to stop drinking.  Has already been to 2 AA appts and seems optimistic about the program.  Applauded her decision to work on her drinking and encouraged her to continue.  Also encouraged her to be open w/ her new psychiatrist on Wednesday.

## 2022-01-10 NOTE — Progress Notes (Signed)
   Subjective:    Patient ID: Nancy Smith, female    DOB: 1955/09/11, 66 y.o.   MRN: [id number]  HPI Depression- ongoing issue for pt.  Currently on Wellbutrin XL '300mg'$  daily and Sertraline '100mg'$  daily.  Taking Trazodone '50mg'$  daily (down from '100mg'$ ).  Tried to not take it for 2 weeks but could not sleep.  Had Gene Sight testing done in 2021.  Pt reports she has been 'drinking' over the last few weeks.  Has been seeing a Wellness Coach- 'it's been really good for me'.  She has created a 'sobriety path' as of Friday w/ her Wellness Coach.  Has appt upcoming w/ psychiatrist.  Plan is to 'get myself off the xanax'.  Has been to 2 AA meetings this week.  Was sober for 'over 20 yrs'.  Pt admits recently she has drank to the point of blanking out.  Was sneaking alcohol.  Mom was an alcoholic.    Pt feels 'stuck'.  Not able to look forward to 'anything'.  She feels that she has been this way for '4-5 yrs'.  Pt thinks she started drinking in order to 'feel something'.  Would start the day w/ a shot of vodka.  Pt feels like she has been 'faking life'.   Review of Systems For ROS see HPI     Objective:   Physical Exam Vitals reviewed.  Constitutional:      General: She is not in acute distress.    Appearance: Normal appearance. She is not ill-appearing.  HENT:     Head: Normocephalic and atraumatic.  Eyes:     Extraocular Movements: Extraocular movements intact.     Conjunctiva/sclera: Conjunctivae normal.     Pupils: Pupils are equal, round, and reactive to light.  Neurological:     General: No focal deficit present.     Mental Status: She is alert and oriented to person, place, and time.  Psychiatric:        Mood and Affect: Mood normal.        Behavior: Behavior normal.        Thought Content: Thought content normal.           Assessment & Plan:

## 2022-01-10 NOTE — Patient Instructions (Signed)
Follow up as needed I am so proud of you for taking this step! Keep up the good work with AA- you can do it! I'm glad that you have your appt on Wednesday!  I think this will be very helpful! Call with any questions or concerns Stay Safe!  Stay Healthy! Hang in there!!!

## 2022-01-10 NOTE — Assessment & Plan Note (Signed)
Ongoing issue.  Pt reports she has been feeling 'stuck' and 'flat' for over 5 yrs.  She doesn't get excited about anything or look forward to anything- even things she should.  She has a psychiatry appt upcoming on Wednesday and would like a copy of her GeneSight testing to take w/ her.  Printed her report and gave it to her.  Encouraged her to be open w/ her psychiatrist about how she is feeling and her drinking.  Total time spent w/ pt 30 minutes and >50% spent counseling.

## 2022-03-08 ENCOUNTER — Encounter: Payer: Medicare PPO | Admitting: Family Medicine

## 2022-03-25 ENCOUNTER — Encounter: Payer: Self-pay | Admitting: Family Medicine

## 2022-03-25 ENCOUNTER — Ambulatory Visit: Payer: Medicare PPO | Admitting: Family Medicine

## 2022-03-25 VITALS — BP 114/78 | HR 69 | Temp 97.6°F | Ht 65.0 in | Wt 121.0 lb

## 2022-03-25 DIAGNOSIS — R32 Unspecified urinary incontinence: Secondary | ICD-10-CM

## 2022-03-25 DIAGNOSIS — F1091 Alcohol use, unspecified, in remission: Secondary | ICD-10-CM | POA: Diagnosis not present

## 2022-03-25 DIAGNOSIS — Z7689 Persons encountering health services in other specified circumstances: Secondary | ICD-10-CM | POA: Diagnosis not present

## 2022-03-25 DIAGNOSIS — F419 Anxiety disorder, unspecified: Secondary | ICD-10-CM | POA: Diagnosis not present

## 2022-03-25 DIAGNOSIS — F32A Depression, unspecified: Secondary | ICD-10-CM

## 2022-03-25 NOTE — Progress Notes (Unsigned)
New Patient Office Visit  Subjective    Patient ID: Nancy Smith, female    DOB: 1955-05-30  Age: 66 y.o. MRN: [id number]  CC:  Chief Complaint  Patient presents with   Establish Care    Needs to schedule physical    HPI Nancy Smith presents to establish care Previous PCP Maximiano Coss, NP  Other providers:  Dermatologist- Dr. Belva Crome - Dr. Grandville Silos for trigger finger Psychiatrist- Christine, Lowry Ram, [place]  Counselor scheduled for January  Integrative Health coach in the past   Urinary incontinence - ?urge  Anxiety and depression-currently taking sertraline 25 mg daily and Wellbutrin 150 mg  She lost her brother 2 weeks ago.   Retired from [employer]. Married.  Exercises. Does yoga   Recently stopped alcohol.  Goes to AA Almost 90 days without alcohol.  Stopped Xanax.   Took a genetic test to see which medications would work best for her.  Cymbalta gave her headaches.   Denies pelvic exam in several years.  Denies fever, chills, dizziness, chest pain, palpitations, shortness of breath, abdominal pain, N/V/D, or LE edema.        Outpatient Encounter Medications as of 03/25/2022  Medication Sig   buPROPion (WELLBUTRIN XL) 150 MG 24 hr tablet Take 150 mg by mouth daily.   calcium carbonate (TUMS EX) 750 MG chewable tablet Chew 1 tablet by mouth daily.   cetirizine (ZYRTEC ALLERGY) 10 MG tablet Take 1 tablet (10 mg total) by mouth daily.   meloxicam (MOBIC) 7.5 MG tablet Take 10 mg by mouth daily.   omeprazole (PRILOSEC) 20 MG capsule Take 2 capsules (40 mg total) by mouth as needed.   sertraline (ZOLOFT) 25 MG tablet Take 25 mg by mouth daily.   traZODone (DESYREL) 50 MG tablet TAKE 1 TABLET(50 MG) BY MOUTH AT BEDTIME AS NEEDED FOR SLEEP   [DISCONTINUED] ALPRAZolam (XANAX) 0.5 MG tablet Take 1 tablet (0.5 mg total) by mouth 2 (two) times daily as needed for anxiety. (Patient not taking: Reported on 03/25/2022)   [DISCONTINUED] buPROPion  (WELLBUTRIN XL) 300 MG 24 hr tablet TAKE 1 TABLET(300 MG) BY MOUTH DAILY   [DISCONTINUED] sertraline (ZOLOFT) 100 MG tablet Take 1 tablet (100 mg total) by mouth daily. (Patient not taking: Reported on 03/25/2022)   No facility-administered encounter medications on file as of 03/25/2022.    Past Medical History:  Diagnosis Date   Colitis    Lymphocytic colitis; had 2 flares in her lifetime   Hx of appendicitis    Hyperlipidemia     Past Surgical History:  Procedure Laterality Date   [place]   Femoral   MOHS SURGERY     for Melanoma   TONSILLECTOMY  1960    Family History  Problem Relation Age of Onset   Heart failure Mother    Lung cancer Mother        Smoker   CAD Mother        s/p bypass   Osteoporosis Mother    Heart failure Father    Hyperlipidemia Father    Heart disease Father    Atrial fibrillation Father    Congestive Heart Failure Brother     Social History   Socioeconomic History   Marital status: Married    Spouse name: Not on file   Number of children: Not on file   Years of education: Not on file   Highest education  level: Master's degree (e.g., MA, MS, MEng, MEd, MSW, MBA)  Occupational History   Not on file  Tobacco Use   Smoking status: Former    Packs/day: 0.25    Years: 15.00    Total pack years: 3.75    Types: Cigarettes    Quit date: 33    Years since quitting: 29.9   Smokeless tobacco: Never  Vaping Use   Vaping Use: Never used  Substance and Sexual Activity   Alcohol use: Yes    Comment: occasionally   Drug use: Never   Sexual activity: Not on file  Other Topics Concern   Not on file  Social History Narrative   Not on file   Social Determinants of Health   Financial Resource Strain: Low Risk  (01/13/2020)   Overall Financial Resource Strain (CARDIA)    Difficulty of Paying Living Expenses: Not hard at all  Food Insecurity: No Food Insecurity (01/13/2020)   Hunger Vital  Sign    Worried About Running Out of Food in the Last Year: Never true    [place] in the Last Year: Never true  Transportation Needs: No Transportation Needs (01/13/2020)   PRAPARE - Hydrologist (Medical): No    Lack of Transportation (Non-Medical): No  Physical Activity: Sufficiently Active (01/13/2020)   Exercise Vital Sign    Days of Exercise per Week: 7 days    Minutes of Exercise per Session: 50 min  Stress: No Stress Concern Present (01/13/2020)   [place]    Feeling of Stress : Not at all  Social Connections: [place] (01/13/2020)   Social Connection and Isolation Panel [NHANES]    Frequency of Communication with Friends and Family: More than three times a week    Frequency of Social Gatherings with Friends and Family: Once a week    Attends Religious Services: 1 to 4 times per year    Active Member of [employer] or Organizations: Yes    Attends Archivist Meetings: 1 to 4 times per year    Marital Status: Married  Human resources officer Violence: Not At Risk (01/13/2020)   Humiliation, Afraid, Rape, and Kick questionnaire    Fear of Current or Ex-Partner: No    Emotionally Abused: No    Physically Abused: No    Sexually Abused: No    ROS      Objective    BP 114/78 (BP Location: Left Arm, Patient Position: Sitting, Cuff Size: Normal)   Pulse 69   Temp 97.6 F (36.4 C) (Temporal)   Ht '5\' 5"'$  (1.651 m)   Wt 121 lb (54.9 kg)   SpO2 96%   BMI 20.14 kg/m   Physical Exam Constitutional:      General: She is not in acute distress.    Appearance: She is not ill-appearing.  Cardiovascular:     Rate and Rhythm: Normal rate and regular rhythm.  Pulmonary:     Effort: Pulmonary effort is normal.     Breath sounds: Normal breath sounds.  Musculoskeletal:     Cervical back: Normal range of motion and neck supple.  Neurological:     Mental Status: She is  alert and oriented to person, place, and time.  Psychiatric:        Mood and Affect: Mood normal.        Behavior: Behavior normal.        Thought Content: Thought content normal.  Assessment & Plan:   Problem List Items Addressed This Visit       Other   Anxiety and depression (Chronic)   Relevant Medications   buPROPion (WELLBUTRIN XL) 150 MG 24 hr tablet   sertraline (ZOLOFT) 25 MG tablet   Other Visit Diagnoses     Urinary incontinence, unspecified type    -  Primary   Alcohol use, unspecified, in remission       Encounter to establish care          Pleasant 66 yo female here to establish care. Reviewed notes from previous PCP.  Continue seeing psychiatrist and counselor.  Referral to OB/GYN for female health.  Consider referral to urologist pending OB/GYN evaluation for urinary symtpoms.  Follow up here for preventive healthcare visit. AWV by nurse.   Visit time 25 minutes in face to face communication with patient and coordination of care, additional 10 minutes spent in record review, coordination or care, ordering tests, communicating/referring to other healthcare professionals, documenting in medical records all on the same day of the visit for total time 35 minutes spent on the visit.   Return for needs fasting CPE and AWV same day with [facility] .   Harland Dingwall, NP-C

## 2022-03-25 NOTE — Patient Instructions (Signed)
Obgyn Offices:   [facility] [address] [place], [place] [phone]  [facility] Address: [address] [place], [place] [address] Phone: [phone]  [place] [address] [place], [place] [address] Phone: [phone]   [place], [place] [address] Phone: [phone]

## 2022-04-13 ENCOUNTER — Telehealth: Payer: Self-pay | Admitting: Family Medicine

## 2022-04-13 NOTE — Telephone Encounter (Signed)
Patient called and would like to have her labs put in the chart before her physical on the 9th.   She is going to go to [facility].

## 2022-04-13 NOTE — Telephone Encounter (Signed)
Pt is requesting labs done prior to her physical on the 9th

## 2022-04-14 NOTE — Telephone Encounter (Signed)
Called pt and she was just used to doing it that way but states she can wait and do it how Vickie prefers

## 2022-04-18 NOTE — Progress Notes (Unsigned)
   Subjective:    Patient ID: Nancy Smith, female    DOB: 1955/07/06, 67 y.o.   MRN: [id number]  HPI No chief complaint on file.  She is new to the practice and here for a complete physical exam. Previous medical care: Last CPE:  Other providers:  Past medical history: Surgeries:  Family history: Mental Health History:  Social history: Lives with ***, works as ***, *** Smoking, drinking alcohol, drug use  Diet: *** Excerise: ***  Immunizations:  Health maintenance:   Mammogram: Colonoscopy: Last Gynecological Exam: Last Menstrual cycle: Pregnancies:  Last Dental Exam: Last Eye Exam:  Wears seatbelt always, uses sunscreen, smoke detectors in home and functioning, does not text while driving and feels safe in home environment.   Reviewed allergies, medications, past medical, surgical, family, and social history.    Review of Systems Review of Systems Constitutional: -fever, -chills, -sweats, -unexpected weight change,-fatigue ENT: -runny nose, -ear pain, -sore throat Cardiology:  -chest pain, -palpitations, -edema Respiratory: -cough, -shortness of breath, -wheezing Gastroenterology: -abdominal pain, -nausea, -vomiting, -diarrhea, -constipation  Hematology: -bleeding or bruising problems Musculoskeletal: -arthralgias, -myalgias, -joint swelling, -back pain Ophthalmology: -vision changes Urology: -dysuria, -difficulty urinating, -hematuria, -urinary frequency, -urgency Neurology: -headache, -weakness, -tingling, -numbness       Objective:   Physical Exam There were no vitals taken for this visit.  General Appearance:    Alert, cooperative, no distress, appears stated age  Head:    Normocephalic, without obvious abnormality, atraumatic  Eyes:    PERRL, conjunctiva/corneas clear, EOM's intact, fundi    benign  Ears:    Normal TM's and external ear canals  Nose:   Nares normal, mucosa normal, no drainage or sinus   tenderness  Throat:   Lips, mucosa, and  tongue normal; teeth and gums normal  Neck:   Supple, no lymphadenopathy;  thyroid:  no   enlargement/tenderness/nodules; no carotid   bruit or JVD  Back:    Spine nontender, no curvature, ROM normal, no CVA     tenderness  Lungs:     Clear to auscultation bilaterally without wheezes, rales or     ronchi; respirations unlabored  Chest Wall:    No tenderness or deformity   Heart:    Regular rate and rhythm, S1 and S2 normal, no murmur, rub   or gallop  Breast Exam:    No tenderness, masses, or nipple discharge or inversion.      No axillary lymphadenopathy  Abdomen:     Soft, non-tender, nondistended, normoactive bowel sounds,    no masses, no hepatosplenomegaly  Genitalia:    Normal external genitalia without lesions.  BUS and vagina normal; cervix without lesions, or cervical motion tenderness. No abnormal vaginal discharge.  Uterus and adnexa not enlarged, nontender, no masses.  Pap performed  Rectal:    Normal tone, no masses or tenderness; guaiac negative stool  Extremities:   No clubbing, cyanosis or edema  Pulses:   2+ and symmetric all extremities  Skin:   Skin color, texture, turgor normal, no rashes or lesions  Lymph nodes:   Cervical, supraclavicular, and axillary nodes normal  Neurologic:   CNII-XII intact, normal strength, sensation and gait; reflexes 2+ and symmetric throughout          Psych:   Normal mood, affect, hygiene and grooming.        Assessment & Plan:  Encounter for general adult medical examination with abnormal findings

## 2022-04-19 ENCOUNTER — Other Ambulatory Visit: Payer: Medicare PPO

## 2022-04-19 ENCOUNTER — Ambulatory Visit (INDEPENDENT_AMBULATORY_CARE_PROVIDER_SITE_OTHER): Payer: Medicare PPO | Admitting: Family Medicine

## 2022-04-19 ENCOUNTER — Encounter: Payer: Self-pay | Admitting: Family Medicine

## 2022-04-19 ENCOUNTER — Ambulatory Visit (INDEPENDENT_AMBULATORY_CARE_PROVIDER_SITE_OTHER): Payer: Medicare PPO

## 2022-04-19 VITALS — BP 110/70 | HR 66 | Temp 98.1°F | Ht 65.0 in | Wt 121.0 lb

## 2022-04-19 VITALS — BP 110/70 | HR 66 | Temp 98.1°F | Resp 16 | Ht 65.0 in | Wt 121.0 lb

## 2022-04-19 DIAGNOSIS — Z0001 Encounter for general adult medical examination with abnormal findings: Secondary | ICD-10-CM | POA: Diagnosis not present

## 2022-04-19 DIAGNOSIS — R319 Hematuria, unspecified: Secondary | ICD-10-CM

## 2022-04-19 DIAGNOSIS — Z0000 Encounter for general adult medical examination without abnormal findings: Secondary | ICD-10-CM | POA: Diagnosis not present

## 2022-04-19 DIAGNOSIS — K52832 Lymphocytic colitis: Secondary | ICD-10-CM | POA: Insufficient documentation

## 2022-04-19 DIAGNOSIS — E7800 Pure hypercholesterolemia, unspecified: Secondary | ICD-10-CM | POA: Diagnosis not present

## 2022-04-19 DIAGNOSIS — R350 Frequency of micturition: Secondary | ICD-10-CM | POA: Diagnosis not present

## 2022-04-19 DIAGNOSIS — R197 Diarrhea, unspecified: Secondary | ICD-10-CM

## 2022-04-19 DIAGNOSIS — R1012 Left upper quadrant pain: Secondary | ICD-10-CM | POA: Diagnosis not present

## 2022-04-19 DIAGNOSIS — R3915 Urgency of urination: Secondary | ICD-10-CM

## 2022-04-19 DIAGNOSIS — Z7185 Encounter for immunization safety counseling: Secondary | ICD-10-CM

## 2022-04-19 LAB — COMPREHENSIVE METABOLIC PANEL
ALT: 14 U/L (ref 0–35)
AST: 17 U/L (ref 0–37)
Albumin: 4.2 g/dL (ref 3.5–5.2)
Alkaline Phosphatase: 49 U/L (ref 39–117)
BUN: 12 mg/dL (ref 6–23)
CO2: 30 mEq/L (ref 19–32)
Calcium: 9 mg/dL (ref 8.4–10.5)
Chloride: 104 mEq/L (ref 96–112)
Creatinine, Ser: 0.79 mg/dL (ref 0.40–1.20)
GFR: 76.82 mL/min (ref >60.00)
Glucose, Bld: 87 mg/dL (ref 70–99)
Potassium: 3.8 mEq/L (ref 3.5–5.1)
Sodium: 140 mEq/L (ref 135–145)
Total Bilirubin: 0.3 mg/dL (ref 0.2–1.2)
Total Protein: 6.7 g/dL (ref 6.0–8.3)

## 2022-04-19 LAB — CBC WITH DIFFERENTIAL/PLATELET
Basophils Absolute: 0 10*3/uL (ref 0.0–0.1)
Basophils Relative: 0.9 % (ref 0.0–3.0)
Eosinophils Absolute: 0.2 10*3/uL (ref 0.0–0.7)
Eosinophils Relative: 4.1 % (ref 0.0–5.0)
HCT: 37.6 % (ref 36.0–46.0)
Hemoglobin: 12.6 g/dL (ref 12.0–15.0)
Lymphocytes Relative: 35.1 % (ref 12.0–46.0)
Lymphs Abs: 1.3 10*3/uL (ref 0.7–4.0)
MCHC: 33.7 g/dL (ref 30.0–36.0)
MCV: 86.9 fl (ref 78.0–100.0)
Monocytes Absolute: 0.4 10*3/uL (ref 0.1–1.0)
Monocytes Relative: 12.1 % — ABNORMAL HIGH (ref 3.0–12.0)
Neutro Abs: 1.7 10*3/uL (ref 1.4–7.7)
Neutrophils Relative %: 47.8 % (ref 43.0–77.0)
Platelets: 222 10*3/uL (ref 150.0–400.0)
RBC: 4.33 Mil/uL (ref 3.87–5.11)
RDW: 12.7 % (ref 11.5–15.5)
WBC: 3.7 10*3/uL — ABNORMAL LOW (ref 4.0–10.5)

## 2022-04-19 LAB — POCT URINALYSIS DIPSTICK
Bilirubin, UA: NEGATIVE
Blood, UA: POSITIVE
Glucose, UA: NEGATIVE
Ketones, UA: NEGATIVE
Leukocytes, UA: NEGATIVE
Nitrite, UA: NEGATIVE
Protein, UA: NEGATIVE
Spec Grav, UA: 1.015 (ref 1.010–1.025)
Urobilinogen, UA: 0.2 E.U./dL
pH, UA: 6 (ref 5.0–8.0)

## 2022-04-19 LAB — AMYLASE: Amylase: 35 U/L (ref 27–131)

## 2022-04-19 LAB — LIPASE: Lipase: 21 U/L (ref 11.0–59.0)

## 2022-04-19 NOTE — Patient Instructions (Signed)
Schedule an eye exam.   You can get your Tdap vaccine at your pharmacy.   You will hear from [facility] and [facility].   Preventive Care 21 Years and Older, Female Preventive care refers to lifestyle choices and visits with your health care provider that can promote health and wellness. Preventive care visits are also called wellness exams. What can I expect for my preventive care visit? Counseling Your health care provider may ask you questions about your: Medical history, including: Past medical problems. Family medical history. Pregnancy and menstrual history. History of falls. Current health, including: Memory and ability to understand (cognition). Emotional well-being. Home life and relationship well-being. Sexual activity and sexual health. Lifestyle, including: Alcohol, nicotine or tobacco, and drug use. Access to firearms. Diet, exercise, and sleep habits. Work and work Statistician. Sunscreen use. Safety issues such as seatbelt and bike helmet use. Physical exam Your health care provider will check your: Height and weight. These may be used to calculate your BMI (body mass index). BMI is a measurement that tells if you are at a healthy weight. Waist circumference. This measures the distance around your waistline. This measurement also tells if you are at a healthy weight and may help predict your risk of certain diseases, such as type 2 diabetes and high blood pressure. Heart rate and blood pressure. Body temperature. Skin for abnormal spots. What immunizations do I need?  Vaccines are usually given at various ages, according to a schedule. Your health care provider will recommend vaccines for you based on your age, medical history, and lifestyle or other factors, such as travel or where you work. What tests do I need? Screening Your health care provider may recommend screening tests for certain conditions. This may include: Lipid and cholesterol  levels. Hepatitis C test. Hepatitis B test. HIV (human immunodeficiency virus) test. STI (sexually transmitted infection) testing, if you are at risk. Lung cancer screening. Colorectal cancer screening. Diabetes screening. This is done by checking your blood sugar (glucose) after you have not eaten for a while (fasting). Mammogram. Talk with your health care provider about how often you should have regular mammograms. BRCA-related cancer screening. This may be done if you have a family history of breast, ovarian, tubal, or peritoneal cancers. Bone density scan. This is done to screen for osteoporosis. Talk with your health care provider about your test results, treatment options, and if necessary, the need for more tests. Follow these instructions at home: Eating and drinking  Eat a diet that includes fresh fruits and vegetables, whole grains, lean protein, and low-fat dairy products. Limit your intake of foods with high amounts of sugar, saturated fats, and salt. Take vitamin and mineral supplements as recommended by your health care provider. Do not drink alcohol if your health care provider tells you not to drink. If you drink alcohol: Limit how much you have to 0-1 drink a day. Know how much alcohol is in your drink. In the [place] one drink equals one 12 oz bottle of beer (355 mL), one 5 oz glass of wine (148 mL), or one 1 oz glass of hard liquor (44 mL). Lifestyle Brush your teeth every morning and night with fluoride toothpaste. Floss one time each day. Exercise for at least 30 minutes 5 or more days each week. Do not use any products that contain nicotine or tobacco. These products include cigarettes, chewing tobacco, and vaping devices, such as e-cigarettes. If you need help quitting, ask your health care provider. Do not use  drugs. If you are sexually active, practice safe sex. Use a condom or other form of protection in order to prevent STIs. Take aspirin only as told by your  health care provider. Make sure that you understand how much to take and what form to take. Work with your health care provider to find out whether it is safe and beneficial for you to take aspirin daily. Ask your health care provider if you need to take a cholesterol-lowering medicine (statin). Find healthy ways to manage stress, such as: Meditation, yoga, or listening to music. Journaling. Talking to a trusted person. Spending time with friends and family. Minimize exposure to UV radiation to reduce your risk of skin cancer. Safety Always wear your seat belt while driving or riding in a vehicle. Do not drive: If you have been drinking alcohol. Do not ride with someone who has been drinking. When you are tired or distracted. While texting. If you have been using any mind-altering substances or drugs. Wear a helmet and other protective equipment during sports activities. If you have firearms in your house, make sure you follow all gun safety procedures. What's next? Visit your health care provider once a year for an annual wellness visit. Ask your health care provider how often you should have your eyes and teeth checked. Stay up to date on all vaccines. This information is not intended to replace advice given to you by your health care provider. Make sure you discuss any questions you have with your health care provider. Document Revised: 09/23/2020 Document Reviewed: 09/23/2020 Elsevier Patient Education  2023 [employer] Inc.  

## 2022-04-19 NOTE — Patient Instructions (Signed)
Nancy Smith , Thank you for taking time to come for your Medicare Wellness Visit. I appreciate your ongoing commitment to your health goals. Please review the following plan we discussed and let me know if I can assist you in the future.   These are the goals we discussed:  Goals      My goal for 2024 to be more physically active and continue to eat healthy.        This is a list of the screening recommended for you and due dates:  Health Maintenance  Topic Date Due   DTaP/Tdap/Td vaccine (2 - Tdap) 10/20/2020   Pneumonia Vaccine (2 - PPSV23 or PCV20) 09/02/2022*   Mammogram  09/21/2022   Medicare Annual Wellness Visit  04/20/2023   Colon Cancer Screening  04/11/2024   Flu Shot  Completed   DEXA scan (bone density measurement)  Completed   Zoster (Shingles) Vaccine  Completed   HPV Vaccine  Aged Out   COVID-19 Vaccine  Discontinued   Hepatitis C Screening: USPSTF Recommendation to screen - Ages 8-79 yo.  Discontinued  *Topic was postponed. The date shown is not the original due date.    Advanced directives: No  Conditions/risks identified: Yes  Next appointment: Follow up in one year for your annual wellness visit.   Preventive Care 34 Years and Older, Female Preventive care refers to lifestyle choices and visits with your health care provider that can promote health and wellness. What does preventive care include? A yearly physical exam. This is also called an annual well check. Dental exams once or twice a year. Routine eye exams. Ask your health care provider how often you should have your eyes checked. Personal lifestyle choices, including: Daily care of your teeth and gums. Regular physical activity. Eating a healthy diet. Avoiding tobacco and drug use. Limiting alcohol use. Practicing safe sex. Taking low-dose aspirin every day. Taking vitamin and mineral supplements as recommended by your health care provider. What happens during an annual well check? The  services and screenings done by your health care provider during your annual well check will depend on your age, overall health, lifestyle risk factors, and family history of disease. Counseling  Your health care provider may ask you questions about your: Alcohol use. Tobacco use. Drug use. Emotional well-being. Home and relationship well-being. Sexual activity. Eating habits. History of falls. Memory and ability to understand (cognition). Work and work Statistician. Reproductive health. Screening  You may have the following tests or measurements: Height, weight, and BMI. Blood pressure. Lipid and cholesterol levels. These may be checked every 5 years, or more frequently if you are over 54 years old. Skin check. Lung cancer screening. You may have this screening every year starting at age 85 if you have a 30-pack-year history of smoking and currently smoke or have quit within the past 15 years. Fecal occult blood test (FOBT) of the stool. You may have this test every year starting at age 53. Flexible sigmoidoscopy or colonoscopy. You may have a sigmoidoscopy every 5 years or a colonoscopy every 10 years starting at age 25. Hepatitis C blood test. Hepatitis B blood test. Sexually transmitted disease (STD) testing. Diabetes screening. This is done by checking your blood sugar (glucose) after you have not eaten for a while (fasting). You may have this done every 1-3 years. Bone density scan. This is done to screen for osteoporosis. You may have this done starting at age 80. Mammogram. This may be done every 1-2 years. Talk  to your health care provider about how often you should have regular mammograms. Talk with your health care provider about your test results, treatment options, and if necessary, the need for more tests. Vaccines  Your health care provider may recommend certain vaccines, such as: Influenza vaccine. This is recommended every year. Tetanus, diphtheria, and acellular  pertussis (Tdap, Td) vaccine. You may need a Td booster every 10 years. Zoster vaccine. You may need this after age 75. Pneumococcal 13-valent conjugate (PCV13) vaccine. One dose is recommended after age 25. Pneumococcal polysaccharide (PPSV23) vaccine. One dose is recommended after age 44. Talk to your health care provider about which screenings and vaccines you need and how often you need them. This information is not intended to replace advice given to you by your health care provider. Make sure you discuss any questions you have with your health care provider. Document Released: 04/24/2015 Document Revised: 12/16/2015 Document Reviewed: 01/27/2015 Elsevier Interactive Patient Education  2017 [place] Prevention in the Home Falls can cause injuries. They can happen to people of all ages. There are many things you can do to make your home safe and to help prevent falls. What can I do on the outside of my home? Regularly fix the edges of walkways and driveways and fix any cracks. Remove anything that might make you trip as you walk through a door, such as a raised step or threshold. Trim any bushes or trees on the path to your home. Use bright outdoor lighting. Clear any walking paths of anything that might make someone trip, such as rocks or tools. Regularly check to see if handrails are loose or broken. Make sure that both sides of any steps have handrails. Any raised decks and porches should have guardrails on the edges. Have any leaves, snow, or ice cleared regularly. Use sand or salt on walking paths during winter. Clean up any spills in your garage right away. This includes oil or grease spills. What can I do in the bathroom? Use night lights. Install grab bars by the toilet and in the tub and shower. Do not use towel bars as grab bars. Use non-skid mats or decals in the tub or shower. If you need to sit down in the shower, use a plastic, non-slip stool. Keep the floor  dry. Clean up any water that spills on the floor as soon as it happens. Remove soap buildup in the tub or shower regularly. Attach bath mats securely with double-sided non-slip rug tape. Do not have throw rugs and other things on the floor that can make you trip. What can I do in the bedroom? Use night lights. Make sure that you have a light by your bed that is easy to reach. Do not use any sheets or blankets that are too big for your bed. They should not hang down onto the floor. Have a firm chair that has side arms. You can use this for support while you get dressed. Do not have throw rugs and other things on the floor that can make you trip. What can I do in the kitchen? Clean up any spills right away. Avoid walking on wet floors. Keep items that you use a lot in easy-to-reach places. If you need to reach something above you, use a strong step stool that has a grab bar. Keep electrical cords out of the way. Do not use floor polish or wax that makes floors slippery. If you must use wax, use non-skid floor wax. Do  not have throw rugs and other things on the floor that can make you trip. What can I do with my stairs? Do not leave any items on the stairs. Make sure that there are handrails on both sides of the stairs and use them. Fix handrails that are broken or loose. Make sure that handrails are as long as the stairways. Check any carpeting to make sure that it is firmly attached to the stairs. Fix any carpet that is loose or worn. Avoid having throw rugs at the top or bottom of the stairs. If you do have throw rugs, attach them to the floor with carpet tape. Make sure that you have a light switch at the top of the stairs and the bottom of the stairs. If you do not have them, ask someone to add them for you. What else can I do to help prevent falls? Wear shoes that: Do not have high heels. Have rubber bottoms. Are comfortable and fit you well. Are closed at the toe. Do not wear  sandals. If you use a stepladder: Make sure that it is fully opened. Do not climb a closed stepladder. Make sure that both sides of the stepladder are locked into place. Ask someone to hold it for you, if possible. Clearly mark and make sure that you can see: Any grab bars or handrails. First and last steps. Where the edge of each step is. Use tools that help you move around (mobility aids) if they are needed. These include: Canes. Walkers. Scooters. Crutches. Turn on the lights when you go into a dark area. Replace any light bulbs as soon as they burn out. Set up your furniture so you have a clear path. Avoid moving your furniture around. If any of your floors are uneven, fix them. If there are any pets around you, be aware of where they are. Review your medicines with your doctor. Some medicines can make you feel dizzy. This can increase your chance of falling. Ask your doctor what other things that you can do to help prevent falls. This information is not intended to replace advice given to you by your health care provider. Make sure you discuss any questions you have with your health care provider. Document Released: 01/22/2009 Document Revised: 09/03/2015 Document Reviewed: 05/02/2014 Elsevier Interactive Patient Education  2017 [employer].

## 2022-04-19 NOTE — Progress Notes (Signed)
Subjective:   Nancy Smith is a 67 y.o. female who presents for Medicare Annual (Subsequent) preventive examination.  Review of Systems     Cardiac Risk Factors include: advanced age (>71mn, >>69women);family history of premature cardiovascular disease;dyslipidemia     Objective:    Today's Vitals   04/19/22 0910  BP: 110/70  Pulse: 66  Resp: 16  Temp: 98.1 F (36.7 C)  SpO2: 98%  Weight: 121 lb (54.9 kg)  Height: '5\' 5"'$  (1.651 m)  PainSc: 0-No pain   Body mass index is 20.14 kg/m.     04/19/2022    9:22 AM 01/13/2020    3:54 PM 02/20/2019   12:32 PM  Advanced Directives  Does Patient Have a Medical Advance Directive? No No No  Would patient like information on creating a medical advance directive? No - Patient declined Yes (MAU/Ambulatory/Procedural Areas - Information given) No - Patient declined    Current Medications (verified) Outpatient Encounter Medications as of 04/19/2022  Medication Sig   buPROPion (WELLBUTRIN XL) 150 MG 24 hr tablet Take 150 mg by mouth daily.   calcium carbonate (TUMS EX) 750 MG chewable tablet Chew 1 tablet by mouth daily.   cetirizine (ZYRTEC ALLERGY) 10 MG tablet Take 1 tablet (10 mg total) by mouth daily.   meloxicam (MOBIC) 7.5 MG tablet Take 10 mg by mouth daily.   omeprazole (PRILOSEC) 20 MG capsule Take 2 capsules (40 mg total) by mouth as needed.   sertraline (ZOLOFT) 25 MG tablet Take 25 mg by mouth daily.   traZODone (DESYREL) 50 MG tablet TAKE 1 TABLET(50 MG) BY MOUTH AT BEDTIME AS NEEDED FOR SLEEP   No facility-administered encounter medications on file as of 04/19/2022.    Allergies (verified) Latex   History: Past Medical History:  Diagnosis Date   Colitis    Lymphocytic colitis; had 2 flares in her lifetime   Hx of appendicitis    Hyperlipidemia    Past Surgical History:  [place] Later[place]   [place]   MOHS SURGERY     for Melanoma    TONSILLECTOMY  1960   Family History  Problem Relation Age of Onset   Heart failure Mother    Lung cancer Mother        Smoker   CAD Mother        s/p bypass   Osteoporosis Mother    Heart failure Father    Hyperlipidemia Father    Heart disease Father    Atrial fibrillation Father    Congestive Heart Failure Brother    Social History   Socioeconomic History   Marital status: Married    Spouse name: Not on file   Number of children: Not on file   Years of education: Not on file   Highest education level: Master's degree (e.g., MA, MS, MEng, MEd, MSW, MBA)  Occupational History   Not on file  Tobacco Use   Smoking status: Former    Packs/day: 0.25    Years: 15.00    Total pack years: 3.75    Types: Cigarettes    Quit date: 1994    Years since quitting: 30.0   Smokeless tobacco: Never  Vaping Use   Vaping Use: Never used  Substance and Sexual Activity   Alcohol use: Yes    Comment: occasionally   Drug use: Never   Sexual activity: Not on file  Other  Topics Concern   Not on file  Social History Narrative   Not on file   Social Determinants of Health   Financial Resource Strain: Low Risk  (04/19/2022)   Overall Financial Resource Strain (CARDIA)    Difficulty of Paying Living Expenses: Not hard at all  Food Insecurity: No Food Insecurity (04/19/2022)   Hunger Vital Sign    Worried About Running Out of Food in the Last Year: Never true    Ran Out of Food in the Last Year: Never true  Transportation Needs: No Transportation Needs (04/19/2022)   PRAPARE - Hydrologist (Medical): No    Lack of Transportation (Non-Medical): No  Physical Activity: Sufficiently Active (04/19/2022)   Exercise Vital Sign    Days of Exercise per Week: 7 days    Minutes of Exercise per Session: 50 min  Stress: No Stress Concern Present (04/19/2022)   [place]    Feeling of Stress : Not at all   Social Connections: [place] (04/19/2022)   Social Connection and Isolation Panel [NHANES]    Frequency of Communication with Friends and Family: More than three times a week    Frequency of Social Gatherings with Friends and Family: Once a week    Attends Religious Services: 1 to 4 times per year    Active Member of [employer] or Organizations: Yes    Attends Archivist Meetings: 1 to 4 times per year    Marital Status: Married    Tobacco Counseling Counseling given: Not Answered   Clinical Intake:  Pre-visit preparation completed: Yes  Pain : No/denies pain Pain Score: 0-No pain     BMI - recorded: 20.14 Nutritional Status: BMI of 19-24  Normal Nutritional Risks: None Diabetes: No  How often do you need to have someone help you when you read instructions, pamphlets, or other written materials from your doctor or pharmacy?: 1 - Never What is the last grade level you completed in school?: Master's Degree in Edfucation; Retired Automotive engineer  Diabetic? No  Interpreter Needed?: No  Information entered by :: Lisette Abu, LPN.   Activities of Daily Living    04/19/2022    9:12 AM  In your present state of health, do you have any difficulty performing the following activities:  Hearing? 0  Vision? 0  Difficulty concentrating or making decisions? 0  Walking or climbing stairs? 0  Dressing or bathing? 0  Doing errands, shopping? 0  Preparing Food and eating ? N  Using the Toilet? N  In the past six months, have you accidently leaked urine? N  Do you have problems with loss of bowel control? N  Managing your Medications? N  Managing your Finances? N  Housekeeping or managing your Housekeeping? N    Patient Care Team: Girtha Rm, NP-C as PCP - General (Family Medicine) Janina Mayo, MD as PCP - Cardiology (Cardiology) Melissa Noon, [place] as Referring Physician (Optometry)  Indicate any recent Medical Services you may have  received from other than [facility] providers in the past year (date may be approximate).     Assessment:   This is a routine wellness examination for [name].  Hearing/Vision screen Hearing Screening - Comments:: Denies hearing difficulties   Vision Screening - Comments:: Wears rx glasses - up to date with routine eye exams with Melissa Noon, OD   Dietary issues and exercise activities discussed: Current Exercise Habits: Home exercise routine,  Type of exercise: walking;treadmill;stretching;strength training/weights;exercise ball;calisthenics;yoga, Time (Minutes): 60, Frequency (Times/Week): 4, Weekly Exercise (Minutes/Week): 240, Intensity: Moderate, Exercise limited by: None identified   Goals Addressed             This Visit's Progress    My goal for 2024 to be more physically active and continue to eat healthy.        Depression Screen    04/19/2022   10:00 AM 04/19/2022    9:45 AM 04/19/2022    9:24 AM 01/10/2022    2:08 PM 11/08/2021   10:26 AM 08/24/2021   11:51 AM 03/01/2021    2:35 PM  PHQ 2/9 Scores  PHQ - 2 Score 0 0 0 5 0 0 0  PHQ- 9 Score 0 0  13 0 3 0    Fall Risk    04/19/2022    9:45 AM 04/19/2022    9:23 AM 01/10/2022    2:08 PM 11/08/2021   10:25 AM 08/24/2021   11:51 AM  [place] in the past year? 0 0 0 0 0  Number falls in past yr: 0 0  0 0  Injury with Fall? 0 0  0 0  Risk for fall due to : No Fall Risks No Fall Risks No Fall Risks No Fall Risks No Fall Risks  Follow up Falls evaluation completed Falls prevention discussed Falls evaluation completed Falls evaluation completed Falls evaluation completed    FALL RISK PREVENTION PERTAINING TO THE HOME:  Any stairs in or around the home? No  If so, are there any without handrails? No  Home free of loose throw rugs in walkways, pet beds, electrical cords, etc? Yes  Adequate lighting in your home to reduce risk of falls? Yes   ASSISTIVE DEVICES UTILIZED TO PREVENT FALLS:  Life alert? No  Use of a  cane, walker or w/c? No  Grab bars in the bathroom? No  Shower chair or bench in shower? No  Elevated toilet seat or a handicapped toilet? No   TIMED UP AND GO:  Was the test performed? Yes .  Length of time to ambulate 10 feet: 8 sec.   Gait steady and fast without use of assistive device  Cognitive Function:        04/19/2022    9:12 AM 01/13/2020    4:10 PM  6CIT Screen  What Year? 0 points 0 points  What month? 0 points 0 points  What time? 0 points 0 points  Count back from 20 0 points 0 points  Months in reverse 0 points 0 points  Repeat phrase 0 points 0 points  Total Score 0 points 0 points    Immunizations Immunization History  Administered Date(s) Administered   Influenza-Unspecified 01/09/2020, 01/04/2022   PFIZER Comirnaty(Gray Top)Covid-19 Tri-Sucrose Vaccine 01/04/2022   PFIZER(Purple Top)SARS-COV-2 Vaccination 06/02/2019, 06/26/2019, 01/09/2020   Pneumococcal Conjugate-13 02/22/2019   Td 10/21/2010   Zoster Recombinat (Shingrix) 02/16/2018, 05/12/2018    TDAP status: Due, Education has been provided regarding the importance of this vaccine. Advised may receive this vaccine at local pharmacy or Health Dept. Aware to provide a copy of the vaccination record if obtained from local pharmacy or Health Dept. Verbalized acceptance and understanding.  Flu Vaccine status: Up to date  Pneumococcal vaccine status: Up to date  Covid-19 vaccine status: Completed vaccines  Qualifies for Shingles Vaccine? Yes   Zostavax completed No   Shingrix Completed?: Yes  Screening Tests Health Maintenance  Topic Date Due  DTaP/Tdap/Td (2 - Tdap) 10/20/2020   Pneumonia Vaccine 40+ Years old (2 - PPSV23 or PCV20) 09/02/2022 (Originally 02/22/2020)   MAMMOGRAM  09/21/2022   Medicare Annual Wellness (AWV)  04/20/2023   COLONOSCOPY (Pts 45-18yr Insurance coverage will need to be confirmed)  04/11/2024   INFLUENZA VACCINE  Completed   DEXA SCAN  Completed   Zoster  Vaccines- Shingrix  Completed   HPV VACCINES  Aged Out   COVID-19 Vaccine  Discontinued   Hepatitis C Screening  Discontinued    Health Maintenance  Health Maintenance Due  Topic Date Due   DTaP/Tdap/Td (2 - Tdap) 10/20/2020    Colorectal cancer screening: Type of screening: Colonoscopy. Completed 04/12/2019. Repeat every 5 years  Mammogram status: Completed 09/28/2021. Repeat every year  Bone Density status: Completed 07/09/2021. Results reflect: Bone density results: OSTEOPENIA. Repeat every 2-3 years.  Lung Cancer Screening: (Low Dose CT Chest recommended if Age 69-80years, 30 pack-year currently smoking OR have quit w/in 15years.) does not qualify.   Lung Cancer Screening Referral: no  Additional Screening:  Hepatitis C Screening: does qualify; Completed no  Vision Screening: Recommended annual ophthalmology exams for early detection of glaucoma and other disorders of the eye. Is the patient up to date with their annual eye exam?  Yes  Who is the provider or what is the name of the office in which the patient attends annual eye exams? JMelissa Noon OD. If pt is not established with a provider, would they like to be referred to a provider to establish care? No .   Dental Screening: Recommended annual dental exams for proper oral hygiene  Community Resource Referral / Chronic Care Management: CRR required this visit?  No   CCM required this visit?  No      Plan:     I have personally reviewed and noted the following in the patient's chart:   Medical and social history Use of alcohol, tobacco or illicit drugs  Current medications and supplements including opioid prescriptions. Patient is not currently taking opioid prescriptions. Functional ability and status Nutritional status Physical activity Advanced directives List of other physicians Hospitalizations, surgeries, and ER visits in previous 12 months Vitals Screenings to include cognitive, depression, and  falls Referrals and appointments  In addition, I have reviewed and discussed with patient certain preventive protocols, quality metrics, and best practice recommendations. A written personalized care plan for preventive services as well as general preventive health recommendations were provided to patient.     SSheral Flow LPN   14/04/7406  Nurse Notes:

## 2022-04-20 ENCOUNTER — Encounter: Payer: Self-pay | Admitting: Family Medicine

## 2022-04-20 LAB — URINE CULTURE: Result:: NO GROWTH

## 2022-04-21 ENCOUNTER — Encounter: Payer: Self-pay | Admitting: Family Medicine

## 2022-04-21 ENCOUNTER — Telehealth: Payer: Self-pay | Admitting: Family Medicine

## 2022-04-21 ENCOUNTER — Other Ambulatory Visit: Payer: Self-pay | Admitting: Family Medicine

## 2022-04-21 MED ORDER — MIRABEGRON ER 25 MG PO TB24: 25.0000 mg | ORAL_TABLET | Freq: Every day | ORAL | 1 refills | Status: DC

## 2022-04-21 NOTE — Telephone Encounter (Signed)
Called pt, please see telephone encounter

## 2022-04-21 NOTE — Telephone Encounter (Signed)
I will let pt know there isnt a way to move up appt, is there anything else we can do in the meantime?

## 2022-04-21 NOTE — Telephone Encounter (Signed)
This has been addressed, please see telephone encounter

## 2022-04-21 NOTE — Telephone Encounter (Signed)
Called pt and informed her of medication sent in and advise. Pt verbalized understanding and states she will try this.

## 2022-04-21 NOTE — Telephone Encounter (Signed)
Patient called stating that she is not able to get scheduled with [facility] until Jan 31st. Patient is stating she does not want to wait 20 days to be seen and if there is anything Vickie can do to help. Best callback number is [phone].

## 2022-04-22 ENCOUNTER — Other Ambulatory Visit: Payer: Self-pay | Admitting: Family Medicine

## 2022-04-22 DIAGNOSIS — D72819 Decreased white blood cell count, unspecified: Secondary | ICD-10-CM

## 2022-04-22 NOTE — Telephone Encounter (Signed)
Pt would like to recheck WBC in a month. Please let me know when orders are placed and I can schedule lab appt

## 2022-06-03 ENCOUNTER — Other Ambulatory Visit: Payer: Medicare PPO

## 2022-09-21 ENCOUNTER — Encounter: Payer: Self-pay | Admitting: Family Medicine

## 2022-09-21 ENCOUNTER — Ambulatory Visit: Payer: Medicare PPO | Admitting: Family Medicine

## 2022-09-21 VITALS — BP 132/76 | HR 62 | Temp 97.6°F | Ht 65.0 in | Wt 119.0 lb

## 2022-09-21 DIAGNOSIS — S8011XA Contusion of right lower leg, initial encounter: Secondary | ICD-10-CM

## 2022-09-21 DIAGNOSIS — R10814 Left lower quadrant abdominal tenderness: Secondary | ICD-10-CM

## 2022-09-21 DIAGNOSIS — R197 Diarrhea, unspecified: Secondary | ICD-10-CM | POA: Diagnosis not present

## 2022-09-21 DIAGNOSIS — R1084 Generalized abdominal pain: Secondary | ICD-10-CM | POA: Diagnosis not present

## 2022-09-21 LAB — CBC WITH DIFFERENTIAL/PLATELET
Basophils Absolute: 0 10*3/uL (ref 0.0–0.1)
Basophils Relative: 0.7 % (ref 0.0–3.0)
Eosinophils Absolute: 0.2 10*3/uL (ref 0.0–0.7)
Eosinophils Relative: 3.4 % (ref 0.0–5.0)
HCT: 37.2 % (ref 36.0–46.0)
Hemoglobin: 12.1 g/dL (ref 12.0–15.0)
Lymphocytes Relative: 33.3 % (ref 12.0–46.0)
Lymphs Abs: 2.1 10*3/uL (ref 0.7–4.0)
MCHC: 32.6 g/dL (ref 30.0–36.0)
MCV: 87 fl (ref 78.0–100.0)
Monocytes Absolute: 0.5 10*3/uL (ref 0.1–1.0)
Monocytes Relative: 8.2 % (ref 3.0–12.0)
Neutro Abs: 3.4 10*3/uL (ref 1.4–7.7)
Neutrophils Relative %: 54.4 % (ref 43.0–77.0)
Platelets: 274 10*3/uL (ref 150.0–400.0)
RBC: 4.28 Mil/uL (ref 3.87–5.11)
RDW: 13.1 % (ref 11.5–15.5)
WBC: 6.3 10*3/uL (ref 4.0–10.5)

## 2022-09-21 LAB — TSH: TSH: 3.46 u[IU]/mL (ref 0.35–5.50)

## 2022-09-21 LAB — COMPREHENSIVE METABOLIC PANEL
ALT: 13 U/L (ref 0–35)
AST: 16 U/L (ref 0–37)
Albumin: 4.2 g/dL (ref 3.5–5.2)
Alkaline Phosphatase: 51 U/L (ref 39–117)
BUN: 12 mg/dL (ref 6–23)
CO2: 29 mEq/L (ref 19–32)
Calcium: 9.4 mg/dL (ref 8.4–10.5)
Chloride: 104 mEq/L (ref 96–112)
Creatinine, Ser: 0.9 mg/dL (ref 0.40–1.20)
GFR: 65.5 mL/min (ref >60.00)
Glucose, Bld: 91 mg/dL (ref 70–99)
Potassium: 3.9 mEq/L (ref 3.5–5.1)
Sodium: 140 mEq/L (ref 135–145)
Total Bilirubin: 0.2 mg/dL (ref 0.2–1.2)
Total Protein: 6.8 g/dL (ref 6.0–8.3)

## 2022-09-21 NOTE — Progress Notes (Signed)
Subjective:     Patient ID: Nancy Smith, female    DOB: [birth year], 67 y.o.   MRN: [id number]  Chief Complaint  Patient presents with   Mass    What's left of hematoma for 5-6 weeks that is still there and wants checked out   Diarrhea    Wants budesonide 3 mg refilled, has colitis and has been under stress so thinks it has flared up. Unable to see GI until August.    HPI  Discussed the use of AI scribe software for clinical note transcription with the patient, who gave verbal consent to proceed.  History of Present Illness          Reports swelling that is gradually improving after hitting her right lower leg on the corner of a bed frame. No pain with ambulation. Not tender any longer.    C/o a 10-12 day hx of abdominal pain with watery diarrhea.  Bloating. No blood.  Poor appetite.    Colitis flare up 4 years ago. Patient of Dr. Elnoria Howard at that time.     Health Maintenance Due  Topic Date Due   Pneumonia Vaccine 14+ Years old (2 of 2 - PPSV23 or PCV20) 02/22/2020   DTaP/Tdap/Td (2 - Tdap) 10/20/2020   MAMMOGRAM  09/21/2022    Past Medical History:  Diagnosis Date   Colitis    Lymphocytic colitis; had 2 flares in her lifetime   Hx of appendicitis    Hyperlipidemia     Past Surgical History:  Procedure Laterality Date   APPENDECTOMY     EYE SURGERY  1996   HERNIA REPAIR  1983   Femoral   MOHS SURGERY     for Melanoma   TONSILLECTOMY  1960    Family History  Problem Relation Age of Onset   Heart failure Mother    Lung cancer Mother        Smoker   CAD Mother        s/p bypass   Osteoporosis Mother    Heart failure Father    Hyperlipidemia Father    Heart disease Father    Atrial fibrillation Father    Congestive Heart Failure Brother     Social History   Socioeconomic History   Marital status: Married    Spouse name: Not on file   Number of children: Not on file   Years of education: Not on file   Highest education level: Master's  degree (e.g., MA, MS, MEng, MEd, MSW, MBA)  Occupational History   Not on file  Tobacco Use   Smoking status: Former    Packs/day: 0.25    Years: 15.00    Additional pack years: 0.00    Total pack years: 3.75    Types: Cigarettes    Quit date: 1994    Years since quitting: 30.4   Smokeless tobacco: Never  Vaping Use   Vaping Use: Never used  Substance and Sexual Activity   Alcohol use: Yes    Comment: occasionally   Drug use: Never   Sexual activity: Not on file  Other Topics Concern   Not on file  Social History Narrative   Not on file   Social Determinants of Health   Financial Resource Strain: Low Risk  (04/19/2022)   Overall Financial Resource Strain (CARDIA)    Difficulty of Paying Living Expenses: Not hard at all  Food Insecurity: No Food Insecurity (04/19/2022)   Hunger Vital Sign    Worried About Running Out  of Food in the Last Year: Never true    Ran Out of Food in the Last Year: Never true  Transportation Needs: No Transportation Needs (04/19/2022)   PRAPARE - Administrator, Civil Service (Medical): No    Lack of Transportation (Non-Medical): No  Physical Activity: Sufficiently Active (04/19/2022)   Exercise Vital Sign    Days of Exercise per Week: 7 days    Minutes of Exercise per Session: 50 min  Stress: No Stress Concern Present (04/19/2022)   [employer] of Occupational Health - Occupational Stress Questionnaire    Feeling of Stress : Not at all  Social Connections: Socially Integrated (04/19/2022)   Social Connection and Isolation Panel [NHANES]    Frequency of Communication with Friends and Family: More than three times a week    Frequency of Social Gatherings with Friends and Family: Once a week    Attends Religious Services: 1 to 4 times per year    Active Member of [employer] or Organizations: Yes    Attends Banker Meetings: 1 to 4 times per year    Marital Status: Married  Catering manager Violence: Not At Risk (04/19/2022)    Humiliation, Afraid, Rape, and Kick questionnaire    Fear of Current or Ex-Partner: No    Emotionally Abused: No    Physically Abused: No    Sexually Abused: No    Outpatient Medications Prior to Visit  Medication Sig Dispense Refill   buPROPion (WELLBUTRIN XL) 150 MG 24 hr tablet Take 150 mg by mouth daily.     calcium carbonate (TUMS EX) 750 MG chewable tablet Chew 1 tablet by mouth daily.     cetirizine (ZYRTEC ALLERGY) 10 MG tablet Take 1 tablet (10 mg total) by mouth daily. 90 tablet 3   meloxicam (MOBIC) 7.5 MG tablet Take 10 mg by mouth daily.     mirabegron ER (MYRBETRIQ) 25 MG TB24 tablet Take 1 tablet (25 mg total) by mouth daily. 30 tablet 1   omeprazole (PRILOSEC) 20 MG capsule Take 2 capsules (40 mg total) by mouth as needed. 90 capsule 1   sertraline (ZOLOFT) 50 MG tablet Take 50 mg by mouth daily.     traZODone (DESYREL) 50 MG tablet TAKE 1 TABLET(50 MG) BY MOUTH AT BEDTIME AS NEEDED FOR SLEEP 90 tablet 1   No facility-administered medications prior to visit.    Allergies  Allergen Reactions   Latex     During skin surgery, no latex bandaid    Review of Systems  Constitutional:  Negative for chills, fever and malaise/fatigue.  Respiratory:  Negative for shortness of breath.   Cardiovascular:  Negative for chest pain, palpitations and leg swelling.  Gastrointestinal:  Positive for abdominal pain and diarrhea. Negative for constipation, nausea and vomiting.  Genitourinary:  Negative for dysuria, frequency and urgency.  Musculoskeletal:  Negative for back pain.  Neurological:  Negative for dizziness, focal weakness and headaches.       Objective:    Physical Exam Constitutional:      General: She is not in acute distress.    Appearance: She is not ill-appearing.  HENT:     Mouth/Throat:     Mouth: Mucous membranes are moist.     Pharynx: Oropharynx is clear.  Eyes:     Extraocular Movements: Extraocular movements intact.     Conjunctiva/sclera:  Conjunctivae normal.  Cardiovascular:     Rate and Rhythm: Normal rate and regular rhythm.  Pulmonary:  Effort: Pulmonary effort is normal.     Breath sounds: Normal breath sounds.  Abdominal:     General: Bowel sounds are increased.     Palpations: Abdomen is soft.     Tenderness: There is abdominal tenderness in the right lower quadrant and left lower quadrant. There is no right CVA tenderness, left CVA tenderness, guarding or rebound. Negative signs include Murphy's sign and McBurney's sign.  Musculoskeletal:        General: Normal range of motion.     Cervical back: Normal range of motion and neck supple. No tenderness.     Right lower leg: No edema.     Left lower leg: No edema.  Lymphadenopathy:     Cervical: No cervical adenopathy.  Skin:    General: Skin is warm and dry.     Findings: No bruising, erythema or rash.          Comments: Non tender hematoma of right lower anterior leg, no surrounding erythema   Neurological:     General: No focal deficit present.     Mental Status: She is alert and oriented to person, place, and time.  Psychiatric:        Mood and Affect: Mood normal.        Behavior: Behavior normal.        Thought Content: Thought content normal.      BP 132/76 (BP Location: Left Arm, Patient Position: Sitting, Cuff Size: Normal)   Pulse 62   Temp 97.6 F (36.4 C) (Temporal)   Ht 5\' 5"  (1.651 m)   Wt 119 lb (54 kg)   SpO2 98%   BMI 19.80 kg/m  Wt Readings from Last 3 Encounters:  09/21/22 119 lb (54 kg)  04/19/22 121 lb (54.9 kg)  04/19/22 121 lb (54.9 kg)       Assessment & Plan:   Problem List Items Addressed This Visit   None Visit Diagnoses     Diarrhea, unspecified type    -  Primary   Relevant Orders   CBC with Differential/Platelet (Completed)   Comprehensive metabolic panel (Completed)   TSH (Completed)   GI Profile, Stool, PCR   CT Abdomen Pelvis Wo Contrast   Ambulatory referral to Gastroenterology   Generalized  abdominal pain       Relevant Orders   CT Abdomen Pelvis Wo Contrast   Ambulatory referral to Gastroenterology   Left lower quadrant abdominal tenderness without rebound tenderness       Relevant Orders   CBC with Differential/Platelet (Completed)   Comprehensive metabolic panel (Completed)   GI Profile, Stool, PCR   CT Abdomen Pelvis Wo Contrast   Ambulatory referral to Gastroenterology   Hematoma of right lower leg          No red flag symptoms. Non surgical abdomen.  Discussed possible etiologies including diverticulitis vs colitis.  CT abdomen ordered along with labs and stool studies.  Urgent referral to GI. She has expired budesonide from several years ago for colitis. Advised that we will hold off on treatment until we know what we are treating.  Strict precautions to go to the ED for worsening symptoms.   I am having Yvonnda Sterner maintain her calcium carbonate, traZODone, cetirizine, omeprazole, buPROPion, sertraline, meloxicam, and mirabegron ER.  No orders of the defined types were placed in this encounter.

## 2022-09-21 NOTE — Patient Instructions (Signed)
Please go downstairs for labs and stool kit.   You will hear from [facility] regarding scheduling your abdominal CT.   We will be in touch with your results.

## 2022-09-23 ENCOUNTER — Encounter: Payer: Self-pay | Admitting: Family Medicine

## 2022-09-23 ENCOUNTER — Telehealth: Payer: Self-pay | Admitting: Internal Medicine

## 2022-09-23 NOTE — Telephone Encounter (Signed)
Hi Dr. Marina Goodell,     We received a referral for this patient. She previously has history with Dr. Haywood Pao office and last had a colonoscopy in 2020. She is wishing to be seen for severe diarrhea. She was previously your patient. Wishing to transfer her care due to her other providers being within [place]. Also wishing to transfer her care due to you diagnosing her previously. Her records are in [facility] for you to review and advise on scheduling.    Thank you.

## 2022-09-23 NOTE — Telephone Encounter (Signed)
Routine new patient visit with me is ok.

## 2022-09-25 LAB — GI PROFILE, STOOL, PCR

## 2022-09-26 ENCOUNTER — Encounter: Payer: Self-pay | Admitting: Internal Medicine

## 2022-09-26 ENCOUNTER — Encounter: Payer: Self-pay | Admitting: Family Medicine

## 2022-09-26 ENCOUNTER — Telehealth: Payer: Self-pay | Admitting: Family Medicine

## 2022-09-26 NOTE — Telephone Encounter (Signed)
Patient called and said she has an appointment in September for gastro with Dr. Marina Goodell. She said that was the soonest she can get in, but her symptoms can't wait that long. She said she's been having diarrhea for a while and she's lost approximately 7 lbs. Patient would like to know if PCP can send Budesonide in to [facility] on [address]. Best callback is [phone].

## 2022-09-26 NOTE — Addendum Note (Signed)
Addended by: Marinus Maw on: 09/26/2022 09:37 AM   Modules accepted: Orders

## 2022-09-26 NOTE — Telephone Encounter (Signed)
Pt still experiencing diarrhea and is wondering if you have any more advise?

## 2022-09-26 NOTE — Telephone Encounter (Signed)
Pt wants Budesonide

## 2022-09-26 NOTE — Telephone Encounter (Signed)
F/u from [employer], pt wants Budesonide

## 2022-09-27 ENCOUNTER — Other Ambulatory Visit: Payer: Self-pay | Admitting: Family Medicine

## 2022-09-27 MED ORDER — PREDNISONE 20 MG PO TABS: 20.0000 mg | ORAL_TABLET | Freq: Every day | ORAL | 0 refills | Status: DC

## 2022-09-27 NOTE — Telephone Encounter (Signed)
Replied to pt in other mychart encounter

## 2022-09-27 NOTE — Telephone Encounter (Signed)
Message sent to pt in mychart encounter.

## 2022-09-29 ENCOUNTER — Encounter: Payer: Self-pay | Admitting: Family Medicine

## 2022-10-04 ENCOUNTER — Ambulatory Visit (HOSPITAL_COMMUNITY): Payer: Medicare PPO

## 2022-10-10 ENCOUNTER — Ambulatory Visit (HOSPITAL_COMMUNITY)
Admission: RE | Admit: 2022-10-10 | Discharge: 2022-10-10 | Disposition: A | Discharge status: Home / Self Care | Payer: Medicare PPO | Source: Ambulatory Visit | Attending: Family Medicine | Admitting: Family Medicine

## 2022-10-10 DIAGNOSIS — R1084 Generalized abdominal pain: Secondary | ICD-10-CM | POA: Insufficient documentation

## 2022-10-10 DIAGNOSIS — R197 Diarrhea, unspecified: Secondary | ICD-10-CM | POA: Insufficient documentation

## 2022-10-10 DIAGNOSIS — R10814 Left lower quadrant abdominal tenderness: Secondary | ICD-10-CM | POA: Insufficient documentation

## 2022-10-10 DIAGNOSIS — K7689 Other specified diseases of liver: Secondary | ICD-10-CM | POA: Diagnosis not present

## 2022-10-11 ENCOUNTER — Other Ambulatory Visit: Payer: Self-pay | Admitting: Family Medicine

## 2022-10-11 DIAGNOSIS — R197 Diarrhea, unspecified: Secondary | ICD-10-CM

## 2022-10-11 DIAGNOSIS — K52839 Microscopic colitis, unspecified: Secondary | ICD-10-CM

## 2022-10-11 MED ORDER — BUDESONIDE 3 MG PO CPEP
9.0000 mg | ORAL_CAPSULE | Freq: Every day | ORAL | 0 refills | Status: AC
Start: 2022-10-11 — End: ?

## 2022-10-11 NOTE — Progress Notes (Signed)
Please reach out to see if she is still having diarrhea and any other symptoms. Fortunately, her CT is negative. I will send in budesonide if she is still having diarrhea. I would like for her to come in for an office visit and recheck of labs in 2 weeks and then I will be able to continue her budesonide if it is helping at that time.

## 2022-10-24 ENCOUNTER — Other Ambulatory Visit: Payer: Self-pay | Admitting: Family Medicine

## 2022-10-24 DIAGNOSIS — Z1231 Encounter for screening mammogram for malignant neoplasm of breast: Secondary | ICD-10-CM

## 2022-10-27 ENCOUNTER — Ambulatory Visit: Payer: Medicare PPO

## 2022-10-27 DIAGNOSIS — Z1231 Encounter for screening mammogram for malignant neoplasm of breast: Secondary | ICD-10-CM

## 2022-11-07 DIAGNOSIS — M79672 Pain in left foot: Secondary | ICD-10-CM | POA: Diagnosis not present

## 2022-11-07 DIAGNOSIS — M79671 Pain in right foot: Secondary | ICD-10-CM | POA: Diagnosis not present

## 2022-11-09 DIAGNOSIS — L570 Actinic keratosis: Secondary | ICD-10-CM | POA: Diagnosis not present

## 2022-11-09 DIAGNOSIS — Z85828 Personal history of other malignant neoplasm of skin: Secondary | ICD-10-CM | POA: Diagnosis not present

## 2022-11-14 DIAGNOSIS — F4321 Adjustment disorder with depressed mood: Secondary | ICD-10-CM | POA: Diagnosis not present

## 2022-11-14 DIAGNOSIS — F1021 Alcohol dependence, in remission: Secondary | ICD-10-CM | POA: Diagnosis not present

## 2022-11-14 DIAGNOSIS — F331 Major depressive disorder, recurrent, moderate: Secondary | ICD-10-CM | POA: Diagnosis not present

## 2022-11-14 DIAGNOSIS — F411 Generalized anxiety disorder: Secondary | ICD-10-CM | POA: Diagnosis not present

## 2022-11-17 ENCOUNTER — Ambulatory Visit: Payer: Medicare PPO | Admitting: Family Medicine

## 2022-12-05 DIAGNOSIS — F4321 Adjustment disorder with depressed mood: Secondary | ICD-10-CM | POA: Diagnosis not present

## 2022-12-05 DIAGNOSIS — F411 Generalized anxiety disorder: Secondary | ICD-10-CM | POA: Diagnosis not present

## 2022-12-05 DIAGNOSIS — F1021 Alcohol dependence, in remission: Secondary | ICD-10-CM | POA: Diagnosis not present

## 2022-12-05 DIAGNOSIS — F331 Major depressive disorder, recurrent, moderate: Secondary | ICD-10-CM | POA: Diagnosis not present

## 2022-12-27 ENCOUNTER — Encounter: Payer: Self-pay | Admitting: Internal Medicine

## 2022-12-27 ENCOUNTER — Ambulatory Visit: Payer: Medicare PPO | Admitting: Internal Medicine

## 2022-12-27 VITALS — BP 122/68 | HR 71 | Ht 65.0 in | Wt 121.0 lb

## 2022-12-27 DIAGNOSIS — R197 Diarrhea, unspecified: Secondary | ICD-10-CM | POA: Diagnosis not present

## 2022-12-27 DIAGNOSIS — R1084 Generalized abdominal pain: Secondary | ICD-10-CM | POA: Diagnosis not present

## 2022-12-27 DIAGNOSIS — K52839 Microscopic colitis, unspecified: Secondary | ICD-10-CM

## 2022-12-27 NOTE — Patient Instructions (Signed)
Please follow up as needed.  _______________________________________________________  If your blood pressure at your visit was 140/90 or greater, please contact your primary care physician to follow up on this.  _______________________________________________________  If you are age 70 or older, your body mass index should be between 23-30. Your Body mass index is 20.14 kg/m. If this is out of the aforementioned range listed, please consider follow up with your Primary Care Provider.  If you are age 73 or younger, your body mass index should be between 19-25. Your Body mass index is 20.14 kg/m. If this is out of the aformentioned range listed, please consider follow up with your Primary Care Provider.   ________________________________________________________  The [facility] providers would like to encourage you to use [facility] to communicate with providers for non-urgent requests or questions.  Due to long hold times on the telephone, sending your provider a message by [facility] may be a faster and more efficient way to get a response.  Please allow 48 business hours for a response.  Please remember that this is for non-urgent requests.  _______________________________________________________

## 2022-12-27 NOTE — Progress Notes (Signed)
HISTORY OF PRESENT ILLNESS:  Nancy Smith is a 67 y.o. female, former [facility] resident, with past medical history who presents today to reestablish with this practice.  I saw the patient back in 2008 for problems with diarrhea.  She underwent colonoscopy and upper endoscopy.  Duodenal biopsies were unremarkable.  Colonic biopsy showed nonspecific inflammation.  She was treated empirically with budesonide.  She improved.  She has not been seen since.  Unremarkable  She did have acute appendicitis in 2009.  In 2020 she saw Dr. Elnoria Howard for abdominal complaints and diarrhea.  She underwent colonoscopy and upper endoscopy.  I have those reports.  Colonoscopy was normal.  The prep was said to be poor in the proximal colon.  This was overcome with aggressive lavage.  Random colon biopsies were taken.  Upper endoscopy was normal.  Duodenal biopsies were taken.  We do not have pathology reports.  Patient cannot recall her pathology.  She tells me that her and Dr. Elnoria Howard disagreed on the extent of her workup (she felt too many tests were being ordered) and it sounds like they parted ways.  Her problems resolved.  She tells me that she did well until this April when she developed issues with recurrent persistent diarrhea and vague abdominal discomfort.  She saw her PCP.  She requested budesonide.  PCP was hesitant.  They scramble to find her GI appointments.  Eventually she asked to reestablish care with our group.  She tells me that her PCP did prescribe budesonide for 2 weeks in June.  This helped.  She is now asymptomatic.  Review of blood work from September 21, 2022 shows normal comprehensive metabolic panel, normal CBC with hemoglobin 12.1, and normal TSH.  She also underwent stool studies September 22, 2022.  Enteric pathogen panel was entirely negative.  Finally, CT scan of the abdomen pelvis without contrast was performed October 10, 2022.  The reported reason was diarrhea and generalized abdominal pain.  The exam  was unremarkable  REVIEW OF SYSTEMS:  All non-GI ROS negative unless otherwise stated in the HPI. Past Medical History:  Diagnosis Date   Colitis    Lymphocytic colitis; had 2 flares in her lifetime   Hx of appendicitis    Hyperlipidemia     Past Surgical History:  Procedure Laterality Date   APPENDECTOMY     EYE SURGERY  1996   HERNIA REPAIR  1983   Femoral   MOHS SURGERY     for Melanoma   TONSILLECTOMY  1960    Social History Nancy Smith  reports that she quit smoking about 30 years ago. Her smoking use included cigarettes. She started smoking about 45 years ago. She has a 3.8 pack-year smoking history. She has never used smokeless tobacco. She reports current alcohol use. She reports that she does not use drugs.  family history includes Atrial fibrillation in her father; CAD in her mother; Congestive Heart Failure in her brother; Heart disease in her father; Heart failure in her father and mother; Hyperlipidemia in her father; Lung cancer in her mother; Osteoporosis in her mother.  Allergies  Allergen Reactions   Latex     During skin surgery, no latex bandaid       PHYSICAL EXAMINATION: Vital signs: BP 122/68   Pulse 71   Ht 5\' 5"  (1.651 m)   Wt 121 lb (54.9 kg)   BMI 20.14 kg/m   Constitutional: generally well-appearing, no acute distress Psychiatric: alert and oriented x3, cooperative Eyes:  extraocular movements intact, anicteric, conjunctiva pink Mouth: oral pharynx moist, no lesions Neck: supple no lymphadenopathy Cardiovascular: heart regular rate and rhythm, no murmur Lungs: clear to auscultation bilaterally Abdomen: soft, nontender, nondistended, no obvious ascites, no peritoneal signs, normal bowel sounds, no organomegaly Rectal: Omitted Extremities: no clubbing, cyanosis, or lower extremity edema bilaterally Skin: no lesions on visible extremities Neuro: No focal deficits.  Cranial nerves intact  ASSESSMENT:  1.  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  Colonoscopy at that time 2.  Status post appendectomy 2009 3.  Colonoscopy and upper endoscopy 2020 with Dr. Elnoria Howard as described above 4.  Recent recurrent problems with abdominal pain and diarrhea seemingly responding to a short course of budesonide  PLAN:  1.  Reviewed her history 2.  Routine colonoscopy would be recommended in 2030 3.  At this point, GI follow-up as needed.  I have encouraged her to use MyChart for correspondence regarding acute symptoms or questions.  We could decide at that point appropriate plans Total time of 45 minutes was spent preparing to see the patient, obtaining comprehensive history, performing medically appropriate physical examination, counseling and educating the patient regarding the above listed issues, and documenting clinical information in the health record.

## 2022-12-29 ENCOUNTER — Encounter: Payer: Self-pay | Admitting: Family Medicine

## 2022-12-29 DIAGNOSIS — J302 Other seasonal allergic rhinitis: Secondary | ICD-10-CM

## 2022-12-29 MED ORDER — CETIRIZINE HCL 10 MG PO TABS
10.0000 mg | ORAL_TABLET | Freq: Every day | ORAL | 3 refills | Status: AC
Start: 2022-12-29 — End: ?

## 2023-01-02 DIAGNOSIS — F1021 Alcohol dependence, in remission: Secondary | ICD-10-CM | POA: Diagnosis not present

## 2023-01-02 DIAGNOSIS — F331 Major depressive disorder, recurrent, moderate: Secondary | ICD-10-CM | POA: Diagnosis not present

## 2023-01-02 DIAGNOSIS — F411 Generalized anxiety disorder: Secondary | ICD-10-CM | POA: Diagnosis not present

## 2023-01-02 DIAGNOSIS — F4321 Adjustment disorder with depressed mood: Secondary | ICD-10-CM | POA: Diagnosis not present

## 2023-01-04 DIAGNOSIS — H43811 Vitreous degeneration, right eye: Secondary | ICD-10-CM | POA: Diagnosis not present

## 2023-01-04 DIAGNOSIS — H52203 Unspecified astigmatism, bilateral: Secondary | ICD-10-CM | POA: Diagnosis not present

## 2023-01-04 DIAGNOSIS — H2513 Age-related nuclear cataract, bilateral: Secondary | ICD-10-CM | POA: Diagnosis not present

## 2023-01-04 DIAGNOSIS — H5203 Hypermetropia, bilateral: Secondary | ICD-10-CM | POA: Diagnosis not present

## 2023-01-06 ENCOUNTER — Ambulatory Visit (INDEPENDENT_AMBULATORY_CARE_PROVIDER_SITE_OTHER): Payer: Medicare PPO

## 2023-01-06 ENCOUNTER — Ambulatory Visit: Payer: Medicare PPO | Admitting: Family Medicine

## 2023-01-06 ENCOUNTER — Encounter: Payer: Self-pay | Admitting: Family Medicine

## 2023-01-06 VITALS — BP 124/72 | HR 60 | Temp 97.6°F | Ht 65.0 in | Wt 121.0 lb

## 2023-01-06 DIAGNOSIS — J029 Acute pharyngitis, unspecified: Secondary | ICD-10-CM | POA: Diagnosis not present

## 2023-01-06 DIAGNOSIS — R5383 Other fatigue: Secondary | ICD-10-CM | POA: Diagnosis not present

## 2023-01-06 DIAGNOSIS — R058 Other specified cough: Secondary | ICD-10-CM

## 2023-01-06 DIAGNOSIS — J040 Acute laryngitis: Secondary | ICD-10-CM | POA: Diagnosis not present

## 2023-01-06 DIAGNOSIS — R918 Other nonspecific abnormal finding of lung field: Secondary | ICD-10-CM | POA: Diagnosis not present

## 2023-01-06 DIAGNOSIS — R0602 Shortness of breath: Secondary | ICD-10-CM

## 2023-01-06 DIAGNOSIS — Z124 Encounter for screening for malignant neoplasm of cervix: Secondary | ICD-10-CM | POA: Diagnosis not present

## 2023-01-06 DIAGNOSIS — R52 Pain, unspecified: Secondary | ICD-10-CM

## 2023-01-06 DIAGNOSIS — R0989 Other specified symptoms and signs involving the circulatory and respiratory systems: Secondary | ICD-10-CM | POA: Diagnosis not present

## 2023-01-06 DIAGNOSIS — R059 Cough, unspecified: Secondary | ICD-10-CM | POA: Diagnosis not present

## 2023-01-06 LAB — CBC WITH DIFFERENTIAL/PLATELET
Basophils Absolute: 0.1 10*3/uL (ref 0.0–0.1)
Basophils Relative: 1.3 % (ref 0.0–3.0)
Eosinophils Absolute: 0.2 10*3/uL (ref 0.0–0.7)
Eosinophils Relative: 4.7 % (ref 0.0–5.0)
HCT: 38.5 % (ref 36.0–46.0)
Hemoglobin: 12.7 g/dL (ref 12.0–15.0)
Lymphocytes Relative: 36.5 % (ref 12.0–46.0)
Lymphs Abs: 1.6 10*3/uL (ref 0.7–4.0)
MCHC: 32.9 g/dL (ref 30.0–36.0)
MCV: 87 fL (ref 78.0–100.0)
Monocytes Absolute: 0.3 10*3/uL (ref 0.1–1.0)
Monocytes Relative: 6.7 % (ref 3.0–12.0)
Neutro Abs: 2.3 10*3/uL (ref 1.4–7.7)
Neutrophils Relative %: 50.8 % (ref 43.0–77.0)
Platelets: 227 10*3/uL (ref 150.0–400.0)
RBC: 4.43 Mil/uL (ref 3.87–5.11)
RDW: 13.3 % (ref 11.5–15.5)
WBC: 4.5 10*3/uL (ref 4.0–10.5)

## 2023-01-06 LAB — POC COVID19 BINAXNOW: SARS Coronavirus 2 Ag: NEGATIVE

## 2023-01-06 MED ORDER — PREDNISONE 20 MG PO TABS
40.0000 mg | ORAL_TABLET | Freq: Every day | ORAL | 0 refills | Status: DC
Start: 2023-01-06 — End: 2023-03-06

## 2023-01-06 MED ORDER — ALBUTEROL SULFATE HFA 108 (90 BASE) MCG/ACT IN AERS
2.0000 | INHALATION_SPRAY | Freq: Four times a day (QID) | RESPIRATORY_TRACT | 0 refills | Status: DC | PRN
Start: 2023-01-06 — End: 2023-03-06

## 2023-01-06 MED ORDER — AZITHROMYCIN 250 MG PO TABS
ORAL_TABLET | ORAL | 0 refills | Status: AC
Start: 2023-01-06 — End: 2023-01-11

## 2023-01-06 NOTE — Progress Notes (Signed)
Subjective:  Nancy Smith is a 67 y.o. female who presents for a 4 -6 wk hx of viral URI symptoms. She has worsened over the past 2 days. Low grade fever, body aches, chest wall tenderness, shortness of breath and coughing more often.   Denies chills, dizziness, chest pain, palpitations, shortness of breath, abdominal pain, N/V/D, urinary symptoms, LE edema.    ROS as in subjective.   Objective: Vitals:   01/06/23 0849  BP: 124/72  Pulse: 60  Temp: 97.6 F (36.4 C)  SpO2: 98%    General appearance: Alert, WD/WN, no distress, mildly ill appearing                             Skin: warm, no rash                           Head: no sinus tenderness                            Eyes: conjunctiva normal, corneas clear, PERRLA                            Ears: pearly TMs, external ear canals normal                          Nose: septum midline, turbinates swollen, with erythema and clear discharge             Mouth/throat: MMM, tongue normal, mild pharyngeal erythema                           Neck: supple, no adenopathy, no thyromegaly, nontender                          Heart: RRR                         Lungs: + exp wheezes and scattered rhonchi, worse on left      Assessment: Cough present for greater than 3 weeks - Plan: POC COVID-19 BinaxNow, DG Chest 2 View, CBC with Differential/Platelet, albuterol (VENTOLIN HFA) 108 (90 Base) MCG/ACT inhaler, azithromycin (ZITHROMAX) 250 MG tablet, CBC with Differential/Platelet, predniSONE (DELTASONE) 20 MG tablet  Body aches - Plan: POC COVID-19 BinaxNow  Shortness of breath - Plan: POC COVID-19 BinaxNow, DG Chest 2 View, CBC with Differential/Platelet, albuterol (VENTOLIN HFA) 108 (90 Base) MCG/ACT inhaler, CBC with Differential/Platelet, predniSONE (DELTASONE) 20 MG tablet  Acute pharyngitis, unspecified etiology - Plan: POC COVID-19 BinaxNow  Fatigue, unspecified type  Laryngitis  Screening for cervical cancer - Plan: Ambulatory  referral to Gynecology   Plan: Negative Covid test. STAT chest X ray and CBC ordered. Z-pak, course of oral prednisone and albuterol prescribed.  Suggested symptomatic OTC remedies. Nasal saline spray for congestion.  Tylenol or Ibuprofen OTC. Salt water gargles. Call/return if worsening or if symptoms aren't resolving.

## 2023-01-06 NOTE — Patient Instructions (Signed)
Please go downstairs for labs and a chest X ray.   Start the antibiotic.   Use the albuterol inhaler for cough, chest tightness, wheezing for the next few days as needed.   Take over the counter Mucinex.   Continue Tylenol or ibuprofen. Continue Zyrtec.   Hydrate. Do salt water gargles for your throat.   Follow up if you are getting worse or not improving in the next week.

## 2023-01-12 ENCOUNTER — Telehealth: Payer: Self-pay | Admitting: Family Medicine

## 2023-01-12 ENCOUNTER — Other Ambulatory Visit: Payer: Self-pay | Admitting: Family Medicine

## 2023-01-12 DIAGNOSIS — R058 Other specified cough: Secondary | ICD-10-CM

## 2023-01-12 DIAGNOSIS — J449 Chronic obstructive pulmonary disease, unspecified: Secondary | ICD-10-CM

## 2023-01-12 MED ORDER — TRELEGY ELLIPTA 100-62.5-25 MCG/ACT IN AEPB
1.0000 | INHALATION_SPRAY | Freq: Every day | RESPIRATORY_TRACT | 2 refills | Status: DC
Start: 2023-01-12 — End: 2023-04-26

## 2023-01-12 MED ORDER — DOXYCYCLINE HYCLATE 100 MG PO TABS
100.0000 mg | ORAL_TABLET | Freq: Two times a day (BID) | ORAL | 0 refills | Status: DC
Start: 2023-01-12 — End: 2023-03-06

## 2023-01-12 NOTE — Telephone Encounter (Signed)
Patient called back and states that she is still having brochial symptoms after finishing the meds on Tuesday.  She would like to know if you can send in another course of antibiotics.  She would prefer not to have the prednisone - because it hypes her up.  Please call patient and advise.  [phone]

## 2023-01-12 NOTE — Telephone Encounter (Signed)
Called pt and advised pt of prescriptions and referral. Pt verbalized understanding  Mentions a tenderness on left sided clavicle, even when she coughs, just wanted it notated

## 2023-01-12 NOTE — Telephone Encounter (Signed)
Please advise 

## 2023-01-13 DIAGNOSIS — F411 Generalized anxiety disorder: Secondary | ICD-10-CM | POA: Diagnosis not present

## 2023-01-13 DIAGNOSIS — F1021 Alcohol dependence, in remission: Secondary | ICD-10-CM | POA: Diagnosis not present

## 2023-01-13 DIAGNOSIS — F331 Major depressive disorder, recurrent, moderate: Secondary | ICD-10-CM | POA: Diagnosis not present

## 2023-01-16 ENCOUNTER — Encounter: Payer: Self-pay | Admitting: Family Medicine

## 2023-01-16 DIAGNOSIS — J449 Chronic obstructive pulmonary disease, unspecified: Secondary | ICD-10-CM

## 2023-01-16 DIAGNOSIS — R0602 Shortness of breath: Secondary | ICD-10-CM

## 2023-01-17 NOTE — Telephone Encounter (Signed)
Ok for pulmonologist referral? If so, can we dx w heart burn and throat tightness???

## 2023-01-19 NOTE — Telephone Encounter (Signed)
Fyi, responding to your message that she did start trelegy

## 2023-03-06 ENCOUNTER — Ambulatory Visit: Payer: Medicare PPO | Admitting: Obstetrics and Gynecology

## 2023-03-06 ENCOUNTER — Encounter: Payer: Self-pay | Admitting: Obstetrics and Gynecology

## 2023-03-06 ENCOUNTER — Other Ambulatory Visit (HOSPITAL_COMMUNITY)
Admission: RE | Admit: 2023-03-06 | Discharge: 2023-03-06 | Disposition: A | Discharge status: Home / Self Care | Payer: Medicare PPO | Source: Ambulatory Visit | Attending: Obstetrics and Gynecology | Admitting: Obstetrics and Gynecology

## 2023-03-06 VITALS — BP 135/76 | HR 66 | Ht 63.48 in | Wt 119.0 lb

## 2023-03-06 DIAGNOSIS — Z124 Encounter for screening for malignant neoplasm of cervix: Secondary | ICD-10-CM

## 2023-03-06 DIAGNOSIS — M8580 Other specified disorders of bone density and structure, unspecified site: Secondary | ICD-10-CM | POA: Diagnosis not present

## 2023-03-06 DIAGNOSIS — N3946 Mixed incontinence: Secondary | ICD-10-CM

## 2023-03-06 DIAGNOSIS — R350 Frequency of micturition: Secondary | ICD-10-CM

## 2023-03-06 DIAGNOSIS — Z9189 Other specified personal risk factors, not elsewhere classified: Secondary | ICD-10-CM

## 2023-03-06 DIAGNOSIS — Z01419 Encounter for gynecological examination (general) (routine) without abnormal findings: Secondary | ICD-10-CM | POA: Insufficient documentation

## 2023-03-06 LAB — URINALYSIS, COMPLETE W/RFL CULTURE
Bacteria, UA: NONE SEEN /[HPF]
Bilirubin Urine: NEGATIVE
Casts: NONE SEEN /[LPF]
Crystals: NONE SEEN /[HPF]
Glucose, UA: NEGATIVE
Hgb urine dipstick: NEGATIVE
Ketones, ur: NEGATIVE
Leukocyte Esterase: NEGATIVE
Nitrites, Initial: NEGATIVE
Protein, ur: NEGATIVE
RBC / HPF: NONE SEEN /[HPF] (ref 0–2)
Specific Gravity, Urine: 1.015 (ref 1.001–1.035)
WBC, UA: NONE SEEN /[HPF] (ref 0–5)
Yeast: NONE SEEN /[HPF]
pH: 7.5 (ref 5.0–8.0)

## 2023-03-06 LAB — NO CULTURE INDICATED

## 2023-03-06 NOTE — Progress Notes (Unsigned)
67 y.o. No obstetric history on file. postmenopausal female here with osteopenia for annual exam. Married. Retired Runner, broadcasting/film/video.  Pt c/o frequent urination and hemorrhoids. Seeing psychiatry for hx of depression and grief therapy.  Postmenopausal bleeding: none Pelvic discharge or pain: none Breast mass, nipple discharge or skin changes : none Last PAP: No results found for: "DIAGPAP", "HPVHIGH", "ADEQPAP" Last mammogram: 10/27/22 BIRADS 1, density b Last colonoscopy: 05/08/2018 Last DXA: 07/09/21 Sexually active: no Exercising: Yes. Walk, yoga, cardio  Smoker: no   GYN HISTORY: No significant history  OB History  No obstetric history on file.    Past Medical History:  Diagnosis Date  . Bronchitis   . Colitis    Lymphocytic colitis; had 2 flares in her lifetime  . Hx of appendicitis   . Hyperlipidemia     Past Surgical History:  Procedure Laterality Date  . APPENDECTOMY    . EYE SURGERY  1996  . HERNIA REPAIR  1983   Femoral  . MOHS SURGERY     for Melanoma  . TONSILLECTOMY  1960    Current Outpatient Medications on File Prior to Visit  Medication Sig Dispense Refill  . buPROPion (WELLBUTRIN XL) 150 MG 24 hr tablet Take 150 mg by mouth daily.    . calcium carbonate (TUMS EX) 750 MG chewable tablet Chew 1 tablet by mouth daily.    . cetirizine (ZYRTEC ALLERGY) 10 MG tablet Take 1 tablet (10 mg total) by mouth daily. 90 tablet 3  . Fluticasone-Umeclidin-Vilant (TRELEGY ELLIPTA) 100-62.5-25 MCG/ACT AEPB Inhale 1 puff into the lungs daily. 1 each 2  . traZODone (DESYREL) 50 MG tablet TAKE 1 TABLET(50 MG) BY MOUTH AT BEDTIME AS NEEDED FOR SLEEP 90 tablet 1   No current facility-administered medications on file prior to visit.    Social History   Socioeconomic History  . Marital status: Married    Spouse name: Not on file  . Number of children: 0  . Years of education: Not on file  . Highest education level: Master's degree (e.g., MA, MS, MEng, MEd, MSW, MBA)   Occupational History  . Occupation: retired  Tobacco Use  . Smoking status: Former    Current packs/day: 0.00    Average packs/day: 0.3 packs/day for 15.0 years (3.8 ttl pk-yrs)    Types: Cigarettes    Start date: 74    Quit date: 64    Years since quitting: 30.9  . Smokeless tobacco: Never  Vaping Use  . Vaping status: Never Used  Substance and Sexual Activity  . Alcohol use: Not Currently  . Drug use: Never  . Sexual activity: Not Currently    Comment: More than 5 parnters, Younger than 16 IC, No STD, No abd pap, no des exposure, no breast cancer meds  Other Topics Concern  . Not on file  Social History Narrative  . Not on file   Social Determinants of Health   Financial Resource Strain: Low Risk  (04/19/2022)   Overall Financial Resource Strain (CARDIA)   . Difficulty of Paying Living Expenses: Not hard at all  Food Insecurity: No Food Insecurity (04/19/2022)   Hunger Vital Sign   . Worried About Programme researcher, broadcasting/film/video in the Last Year: Never true   . Ran Out of Food in the Last Year: Never true  Transportation Needs: No Transportation Needs (04/19/2022)   PRAPARE - Transportation   . Lack of Transportation (Medical): No   . Lack of Transportation (Non-Medical): No  Physical Activity:  Sufficiently Active (04/19/2022)   Exercise Vital Sign   . Days of Exercise per Week: 7 days   . Minutes of Exercise per Session: 50 min  Stress: No Stress Concern Present (04/19/2022)   [employer] of Occupational Health - Occupational Stress Questionnaire   . Feeling of Stress : Not at all  Social Connections: Socially Integrated (04/19/2022)   Social Connection and Isolation Panel [NHANES]   . Frequency of Communication with Friends and Family: More than three times a week   . Frequency of Social Gatherings with Friends and Family: Once a week   . Attends Religious Services: 1 to 4 times per year   . Active Member of Clubs or Organizations: Yes   . Attends Banker  Meetings: 1 to 4 times per year   . Marital Status: Married  Catering manager Violence: Not At Risk (04/19/2022)   Humiliation, Afraid, Rape, and Kick questionnaire   . Fear of Current or Ex-Partner: No   . Emotionally Abused: No   . Physically Abused: No   . Sexually Abused: No    Family History  Problem Relation Age of Onset  . Heart failure Mother   . Lung cancer Mother        Smoker  . CAD Mother        s/p bypass  . Osteoporosis Mother   . Heart failure Father   . Hyperlipidemia Father   . Heart disease Father   . Atrial fibrillation Father   . Congestive Heart Failure Brother   . Liver disease Neg Hx   . Esophageal cancer Neg Hx   . Colon cancer Neg Hx     Allergies  Allergen Reactions  . Latex     During skin surgery, no latex bandaid      PE Today's Vitals   03/06/23 0928  BP: 135/76  Pulse: 66  SpO2: 98%  Weight: 119 lb (54 kg)  Height: 5' 3.48" (1.612 m)   Body mass index is 20.76 kg/m.  Physical Exam Vitals reviewed. Exam conducted with a chaperone present.  Constitutional:      General: She is not in acute distress.    Appearance: Normal appearance.  HENT:     Head: Normocephalic and atraumatic.     Nose: Nose normal.  Eyes:     Extraocular Movements: Extraocular movements intact.     Conjunctiva/sclera: Conjunctivae normal.  Neck:     Thyroid: No thyroid mass, thyromegaly or thyroid tenderness.  Pulmonary:     Effort: Pulmonary effort is normal.  Chest:     Chest wall: No mass or tenderness.  Breasts:    Right: Normal. No swelling, mass, nipple discharge, skin change or tenderness.     Left: Normal. No swelling, mass, nipple discharge, skin change or tenderness.  Abdominal:     General: There is no distension.     Palpations: Abdomen is soft.     Tenderness: There is no abdominal tenderness.  Genitourinary:    General: Normal vulva.     Exam position: Lithotomy position.     Urethra: No prolapse.     Vagina: Normal. No vaginal  discharge or bleeding.     Cervix: Normal. No lesion.     Uterus: Normal. Not enlarged and not tender.      Adnexa: Right adnexa normal and left adnexa normal.  Musculoskeletal:        General: Normal range of motion.     Cervical back: Normal range of motion.  Lymphadenopathy:     Upper Body:     Right upper body: No axillary adenopathy.     Left upper body: No axillary adenopathy.     Lower Body: No right inguinal adenopathy. No left inguinal adenopathy.  Skin:    General: Skin is warm and dry.  Neurological:     General: No focal deficit present.     Mental Status: She is alert.  Psychiatric:        Mood and Affect: Mood normal.        Behavior: Behavior normal.      Assessment and Plan:        Mixed stress and urge urinary incontinence -     Urinalysis,Complete w/RFL Culture -     Ambulatory referral to Physical Therapy  Other specified personal risk factors, not elsewhere classified  Well woman exam with routine gynecological exam Assessment & Plan: Cervical cancer screening performed according to ASCCP guidelines. Encouraged annual mammogram screening Colonoscopy UTD DXA UTD Labs and immunizations with her primary Encouraged safe sexual practices as indicated Encouraged healthy lifestyle practices with diet and exercise For patients under 50yo, I recommend 1000mg  calcium daily and 600IU of vitamin D daily.    Cervical cancer screening -     Cytology - PAP  Other orders -     REFLEXIVE URINE CULTURE    Rosalyn Gess, MD

## 2023-03-06 NOTE — Assessment & Plan Note (Signed)
Cervical cancer screening performed according to ASCCP guidelines. Encouraged annual mammogram screening Colonoscopy UTD DXA UTD Labs and immunizations with her primary Encouraged safe sexual practices as indicated Encouraged healthy lifestyle practices with diet and exercise For patients under 69yo, I recommend 1000mg  calcium daily and 600IU of vitamin D daily.

## 2023-03-06 NOTE — Patient Instructions (Signed)
For patients under 50-70yo, I recommend 1200mg  calcium daily and 600IU of vitamin D daily. For patients over 70yo, I recommend 1200mg  calcium daily and 800IU of vitamin D daily.  Health Maintenance, Female Adopting a healthy lifestyle and getting preventive care are important in promoting health and wellness. Ask your health care provider about: The right schedule for you to have regular tests and exams. Things you can do on your own to prevent diseases and keep yourself healthy. What should I know about diet, weight, and exercise? Eat a healthy diet  Eat a diet that includes plenty of vegetables, fruits, low-fat dairy products, and lean protein. Do not eat a lot of foods that are high in solid fats, added sugars, or sodium. Maintain a healthy weight Body mass index (BMI) is used to identify weight problems. It estimates body fat based on height and weight. Your health care provider can help determine your BMI and help you achieve or maintain a healthy weight. Get regular exercise Get regular exercise. This is one of the most important things you can do for your health. Most adults should: Exercise for at least 150 minutes each week. The exercise should increase your heart rate and make you sweat (moderate-intensity exercise). Do strengthening exercises at least twice a week. This is in addition to the moderate-intensity exercise. Spend less time sitting. Even light physical activity can be beneficial. Watch cholesterol and blood lipids Have your blood tested for lipids and cholesterol at 69 years of age, then have this test every 5 years. Have your cholesterol levels checked more often if: Your lipid or cholesterol levels are high. You are older than 69 years of age. You are at high risk for heart disease. What should I know about cancer screening? Depending on your health history and family history, you may need to have cancer screening at various ages. This may include screening  for: Breast cancer. Cervical cancer. Colorectal cancer. Skin cancer. Lung cancer. What should I know about heart disease, diabetes, and high blood pressure? Blood pressure and heart disease High blood pressure causes heart disease and increases the risk of stroke. This is more likely to develop in people who have high blood pressure readings or are overweight. Have your blood pressure checked: Every 3-5 years if you are 83-70 years of age. Every year if you are 4 years old or older. Diabetes Have regular diabetes screenings. This checks your fasting blood sugar level. Have the screening done: Once every three years after age 68 if you are at a normal weight and have a low risk for diabetes. More often and at a younger age if you are overweight or have a high risk for diabetes. What should I know about preventing infection? Hepatitis B If you have a higher risk for hepatitis B, you should be screened for this virus. Talk with your health care provider to find out if you are at risk for hepatitis B infection. Hepatitis C Testing is recommended for: Everyone born from [birth year] through 1967. Anyone with known risk factors for hepatitis C. Sexually transmitted infections (STIs) Get screened for STIs, including gonorrhea and chlamydia, if: You are sexually active and are younger than 69 years of age. You are older than 69 years of age and your health care provider tells you that you are at risk for this type of infection. Your sexual activity has changed since you were last screened, and you are at increased risk for chlamydia or gonorrhea. Ask your health care provider if  you are at risk. Ask your health care provider about whether you are at high risk for HIV. Your health care provider may recommend a prescription medicine to help prevent HIV infection. If you choose to take medicine to prevent HIV, you should first get tested for HIV. You should then be tested every 3 months for as long as you  are taking the medicine. Osteoporosis and menopause Osteoporosis is a disease in which the bones lose minerals and strength with aging. This can result in bone fractures. If you are 44 years old or older, or if you are at risk for osteoporosis and fractures, ask your health care provider if you should: Be screened for bone loss. Take a calcium or vitamin D supplement to lower your risk of fractures. Be given hormone replacement therapy (HRT) to treat symptoms of menopause. Follow these instructions at home: Alcohol use Do not drink alcohol if: Your health care provider tells you not to drink. You are pregnant, may be pregnant, or are planning to become pregnant. If you drink alcohol: Limit how much you have to: 0-1 drink a day. Know how much alcohol is in your drink. In the [place] one drink equals one 12 oz bottle of beer (355 mL), one 5 oz glass of wine (148 mL), or one 1 oz glass of hard liquor (44 mL). Lifestyle Do not use any products that contain nicotine or tobacco. These products include cigarettes, chewing tobacco, and vaping devices, such as e-cigarettes. If you need help quitting, ask your health care provider. Do not use street drugs. Do not share needles. Ask your health care provider for help if you need support or information about quitting drugs. General instructions Schedule regular health, dental, and eye exams. Stay current with your vaccines. Tell your health care provider if: You often feel depressed. You have ever been abused or do not feel safe at home. Summary Adopting a healthy lifestyle and getting preventive care are important in promoting health and wellness. Follow your health care provider's instructions about healthy diet, exercising, and getting tested or screened for diseases. Follow your health care provider's instructions on monitoring your cholesterol and blood pressure. This information is not intended to replace advice given to you by your health  care provider. Make sure you discuss any questions you have with your health care provider. Document Revised: 08/17/2020 Document Reviewed: 08/17/2020 Elsevier Patient Education  2024 [employer].

## 2023-03-08 LAB — CYTOLOGY - PAP: Diagnosis: NEGATIVE

## 2023-03-08 NOTE — Assessment & Plan Note (Signed)
Continue vitamin D+Calcium Encouraged weight based exercise DXA due 2025

## 2023-03-14 NOTE — Therapy (Unsigned)
OUTPATIENT PHYSICAL THERAPY FEMALE PELVIC EVALUATION   Patient Name: Nancy Smith MRN: [id number] DOB:Dec 10, 1955, 67 y.o., female Today's Date: 03/15/2023  END OF SESSION:  PT End of Session - 03/15/23 1019     Visit Number 1    Date for PT Re-Evaluation 05/10/23    Authorization Type [employer]    PT Start Time 1015    PT Stop Time 1055    PT Time Calculation (min) 40 min    Activity Tolerance Patient tolerated treatment well    Behavior During Therapy WFL for tasks assessed/performed             Past Medical History:  Diagnosis Date   Bronchitis    Colitis    Lymphocytic colitis; had 2 flares in her lifetime   Hx of appendicitis    Hyperlipidemia    Past Surgical History:  Procedure Laterality Date   APPENDECTOMY     EYE SURGERY  1996   HERNIA REPAIR  1983   Femoral   MOHS SURGERY     for Melanoma   TONSILLECTOMY  1960   Patient Active Problem List   Diagnosis Date Noted   Well woman exam with routine gynecological exam 03/06/2023   Encounter for general adult medical examination with abnormal findings 04/19/2022   Urinary frequency 04/19/2022   Lymphocytic colitis 04/19/2022   Left upper quadrant pain 04/19/2022   Alcohol abuse 01/10/2022   Pain in left shin 11/08/2021   Seasonal allergies 11/08/2021   Depression, recurrent (HCC) 11/08/2021   History of melanoma 09/09/2019   History of basal cell carcinoma of skin 09/09/2019   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   Osteopenia 09/09/2019   Dense breast tissue on mammogram 09/09/2019   Hypercholesterolemia 09/09/2019   History of colon polyps 09/09/2019   Hemangioma of liver 09/09/2019   Hepatic cyst 09/09/2019   History of posterior vitreous detachment 09/09/2019   Anxiety and depression 09/02/2019   Gastroesophageal reflux disease 09/02/2019    PCP: Avanell Shackleton, NP-C  REFERRING PROVIDER: Rosalyn Gess, MD   REFERRING DIAG: N39.46 (ICD-10-CM) - Mixed stress and urge urinary  incontinence   THERAPY DIAG:  Muscle weakness (generalized)  Other lack of coordination  Rationale for Evaluation and Treatment: Rehabilitation  ONSET DATE: 2019  SUBJECTIVE:                                                                                                                                                                                           SUBJECTIVE STATEMENT: The last few months patient is having more leakage than normal.  Fluid intake: Yes: water, diet coke last thing in  evening, tea, coffee, OJ, ginger ale    PAIN:  Are you having pain? No   PRECAUTIONS: Other: Melanoma  RED FLAGS: None   WEIGHT BEARING RESTRICTIONS: No  FALLS:  Has patient fallen in last 6 months? No  LIVING ENVIRONMENT: Lives with: lives with their spouse  OCCUPATION: retired Runner, broadcasting/film/video, walking and yoga  PLOF: Independent  PATIENT GOALS: reduce urinary leakage  PERTINENT HISTORY:  Appendectomy; Hernia repair; MOHS Surgery for Melanoma, lymphocytic colitis  BOWEL MOVEMENT: No issues  URINATION: Pain with urination: No Fully empty bladder: No, she thinks she does and when stands up she is leaking Stream: Weak Urgency: Yes: and sometimes will leak, uses restroom before she goes somewhere Frequency: every 2 hours, wakes up 1-2 times  Leakage: Urge to void, Walking to the bathroom, Coughing, Sneezing, and hearing running water , brush teeth and will have urgency Pads: Yes: change 3 times per day  INTERCOURSE: not active   PREGNANCY:None  PROLAPSE: None   OBJECTIVE:  Note: Objective measures were completed at Evaluation unless otherwise noted.  DIAGNOSTIC FINDINGS:  none  PATIENT SURVEYS:  UIQ-7 29 PFIQ-7 28  COGNITION: Overall cognitive status: Within functional limits for tasks assessed     SENSATION: Light touch: Appears intact Proprioception: Appears intact    POSTURE: No Significant postural limitations  PELVIC ALIGNMENT:  LUMBARAROM/PROM:  full lumbar ROM    LOWER EXTREMITY QIO:[id number] hip ROM is full   LOWER EXTREMITY MMT:  MMT Right eval Left eval  Hip extension 4/5 4/5  Hip abduction 4/5 4/5   PALPATION:   General  Difficulty with diaphragmatic breathing and filling the abdomen up.                 External Perineal Exam intact with dryness                             Internal Pelvic Floor tightness along the sides of the introitus  Patient confirms identification and approves PT to assess internal pelvic floor and treatment Yes  PELVIC MMT:   MMT eval  Vaginal Initially 2/5 with weakness on the lateral sides; after manual work able to make more of a circular contraction with strength 3/5 and holding for 3 sec   (Blank rows = not tested)        TONE: Average    TODAY'S TREATMENT:                                                                                                                              DATE: 03/15/23  EVAL See below   PATIENT EDUCATION:  03/15/23 Education details: Access Code: [email or web address], Educated patient on vaginal moisturizers and how to massage into the vulva and introitus Person educated: Patient Education method: Explanation, Demonstration, Tactile cues, Verbal cues, and Handouts Education comprehension: verbalized understanding, returned demonstration, verbal cues required, tactile cues required, and needs further education  HOME  EXERCISE PROGRAM: 03/15/23 Access Code: [email or web address] URL: [email or web address] Date: 03/15/2023 Prepared by: Eulis Foster  Exercises - Supine Pelvic Floor Contraction  - 2 x daily - 7 x weekly - 1 sets - 10 reps - 4 sec hold - Seated Hip Adduction Isometrics with Ball  - 1 x daily - 7 x weekly - 1 sets - 10 reps - 4 sec hold  ASSESSMENT:  CLINICAL IMPRESSION: Patient is a 67 y.o. female who was seen today for physical therapy evaluation and treatment for mixed incontinence. Patient reports the past few months her incontinence has  become worse making her wear more pads and needing to know where the bathrooms are. Patient will leak urine with urge to void, walking to the bathroom, coughing, sneezing, and hearing running water. She will get urgency with  brushing her teeth. Patient changes her pad 3 times per day. She has to go to the bathroom before going out and prior to activities. She stands after urinating and urine will dribble out. Initially her pelvic floor strength is  2/5 with weakness on the lateral sides. After manual work she was able to make more of a circular contraction with strength 3/5 and holding for 3 sec. Patient needs verbal cues to not hold her breath with pelvic floor contraction. Patient will benefit from skilled therapy to improve pelvic floor strength and coordination to reduce urinary leakage.   OBJECTIVE IMPAIRMENTS: decreased activity tolerance, decreased coordination, decreased strength, and increased fascial restrictions.   ACTIVITY LIMITATIONS: continence  PARTICIPATION LIMITATIONS: shopping and community activity  PERSONAL FACTORS: Time since onset of injury/illness/exacerbation are also affecting patient's functional outcome.   REHAB POTENTIAL: Excellent  CLINICAL DECISION MAKING: Stable/uncomplicated  EVALUATION COMPLEXITY: Low   GOALS: Goals reviewed with patient? Yes  SHORT TERM GOALS: Target date: 04/12/23  Patient educated on vaginal moisturizers and how it improves the health of the tissue.  Baseline: Goal status: INITIAL  2.  Patient independent with initial HEP for pelvic floor strength.  Baseline:  Goal status: INITIAL  3.  Patient educated on bladder irritants to reduce urgency.  Baseline:  Goal status: INITIAL  4.  Patient educated on urge to void.  Baseline:  Goal status: INITIAL  5.  Patient is able to perform diaphragmatic breathing to elongate the pelvic floor.  Baseline:  Goal status: INITIAL   LONG TERM GOALS: Target date: 05/10/23  Patient independent  with advanced HEP for pelvic floor and core strength.  Baseline:  Goal status: INITIAL  2.  Patient reports she wears 0-1 pads per day due to increased strength of pelvic floor.  Baseline:  Goal status: INITIAL  3.  Patient is able to hear running water and brush her teeth without having the urge to void using her behavioral technique.  Baseline:  Goal status: INITIAL  4.  Patient is able to stand up after urinating and not dribble urine due to increased in strength.  Baseline:  Goal status: INITIAL  5.  Patient is able to have the urge to urinate and walk to the bathroom without urinary leakage.  Baseline:  Goal status: INITIAL   PLAN:  PT FREQUENCY: 1x/week  PT DURATION: 8 weeks  PLANNED INTERVENTIONS: 97110-Therapeutic exercises, 97530- Therapeutic activity, 97112- Neuromuscular re-education, 97140- Manual therapy, Patient/Family education, Dry Needling, and Biofeedback  PLAN FOR NEXT SESSION: diaphragmatic breathing, bladder irritants, see how the vaginal moisturizers are doing, pelvic floor with core strength   Eulis Foster, PT 03/15/23 11:24 AM

## 2023-03-15 ENCOUNTER — Encounter: Payer: Self-pay | Admitting: Physical Therapy

## 2023-03-15 ENCOUNTER — Other Ambulatory Visit: Payer: Self-pay

## 2023-03-15 ENCOUNTER — Ambulatory Visit: Payer: Medicare PPO | Attending: Obstetrics and Gynecology | Admitting: Physical Therapy

## 2023-03-15 DIAGNOSIS — R278 Other lack of coordination: Secondary | ICD-10-CM | POA: Insufficient documentation

## 2023-03-15 DIAGNOSIS — M6281 Muscle weakness (generalized): Secondary | ICD-10-CM | POA: Diagnosis not present

## 2023-03-15 DIAGNOSIS — N3946 Mixed incontinence: Secondary | ICD-10-CM | POA: Insufficient documentation

## 2023-03-15 NOTE — Patient Instructions (Signed)
Moisturizers They are used in the vagina to hydrate the mucous membrane that make up the vaginal canal. Designed to keep a more normal acid balance (ph) Once placed in the vagina, it will last between two to three days.  Ingredients to avoid is glycerin and fragrance, can increase chance of infection      Creams to use externally on the Vulva area daily at night;  Marathon Oil (good for for cancer patients that had radiation to the area)- [place] or [email or web address] Vulva Balm/ V-magic cream by medicine mama- amazon Julva-amazon Vital "V Wild Yam salve ( help moisturize and help with thinning vulvar area, does have Beeswax MoodMaid Botanical Pro-Meno Wild Yam Cream- Amazon Desert Harvest Gele Cleo by Zane Herald labial moisturizer (Amazon),  aloe Good Clean Love Enchanted Rose by intimate rose Yes  Things to avoid in the vaginal area Do not use things to irritate the vulvar area No lotions just specialized creams for the vulva area- Neogyn, V-magic,  No soaps; can use Aveeno or Calendula cleanser, unscented Dove if needed. Must be gentle No deodorants No douches Good to sleep without underwear to let the vaginal area to air out No scrubbing: spread the lips to let warm water rinse over labias and pat dry   Massage the entrance of the vagina 10 strokes from 1:00 to 6:00 and 11:00 to 6 :00 3 times per week  [facility] [address], [address] [place], [place] [address] Phone # [phone] Fax [phone]

## 2023-03-20 ENCOUNTER — Ambulatory Visit: Payer: Medicare PPO | Admitting: Physical Therapy

## 2023-03-20 ENCOUNTER — Encounter: Payer: Self-pay | Admitting: Physical Therapy

## 2023-03-20 DIAGNOSIS — F4321 Adjustment disorder with depressed mood: Secondary | ICD-10-CM | POA: Diagnosis not present

## 2023-03-20 DIAGNOSIS — M6281 Muscle weakness (generalized): Secondary | ICD-10-CM

## 2023-03-20 DIAGNOSIS — J302 Other seasonal allergic rhinitis: Secondary | ICD-10-CM | POA: Diagnosis not present

## 2023-03-20 DIAGNOSIS — F411 Generalized anxiety disorder: Secondary | ICD-10-CM | POA: Diagnosis not present

## 2023-03-20 DIAGNOSIS — R278 Other lack of coordination: Secondary | ICD-10-CM | POA: Diagnosis not present

## 2023-03-20 DIAGNOSIS — N3946 Mixed incontinence: Secondary | ICD-10-CM | POA: Diagnosis not present

## 2023-03-20 DIAGNOSIS — F1021 Alcohol dependence, in remission: Secondary | ICD-10-CM | POA: Diagnosis not present

## 2023-03-20 DIAGNOSIS — F331 Major depressive disorder, recurrent, moderate: Secondary | ICD-10-CM | POA: Diagnosis not present

## 2023-03-20 NOTE — Therapy (Addendum)
OUTPATIENT PHYSICAL THERAPY FEMALE PELVIC TREATMENT   Patient Name: Nancy Smith MRN: [id number] DOB:November 23, 1955, 67 y.o., female Today's Date: 03/20/2023  END OF SESSION:  PT End of Session - 03/20/23 1452     Visit Number 2    Date for PT Re-Evaluation 05/10/23    Authorization Type [employer]    Authorization Time Period 03/15/23-05/10/23    Authorization - Visit Number 2    Authorization - Number of Visits 8    Progress Note Due on Visit 10    PT Start Time 1450    PT Stop Time 1530    PT Time Calculation (min) 40 min    Activity Tolerance Patient tolerated treatment well    Behavior During Therapy WFL for tasks assessed/performed             Past Medical History:  Diagnosis Date   Bronchitis    Colitis    Lymphocytic colitis; had 2 flares in her lifetime   Hx of appendicitis    Hyperlipidemia    Past Surgical History:  Procedure Laterality Date   APPENDECTOMY     EYE SURGERY  1996   HERNIA REPAIR  1983   Femoral   MOHS SURGERY     for Melanoma   TONSILLECTOMY  1960   Patient Active Problem List   Diagnosis Date Noted   Well woman exam with routine gynecological exam 03/06/2023   Encounter for general adult medical examination with abnormal findings 04/19/2022   Urinary frequency 04/19/2022   Lymphocytic colitis 04/19/2022   Left upper quadrant pain 04/19/2022   Alcohol abuse 01/10/2022   Pain in left shin 11/08/2021   Seasonal allergies 11/08/2021   Depression, recurrent (HCC) 11/08/2021   History of melanoma 09/09/2019   History of basal cell carcinoma of skin 09/09/2019   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   Osteopenia 09/09/2019   Dense breast tissue on mammogram 09/09/2019   Hypercholesterolemia 09/09/2019   History of colon polyps 09/09/2019   Hemangioma of liver 09/09/2019   Hepatic cyst 09/09/2019   History of posterior vitreous detachment 09/09/2019   Anxiety and depression 09/02/2019   Gastroesophageal reflux disease 09/02/2019     PCP: Avanell Shackleton, NP-C  REFERRING PROVIDER: Rosalyn Gess, MD   REFERRING DIAG: N39.46 (ICD-10-CM) - Mixed stress and urge urinary incontinence   THERAPY DIAG:  Muscle weakness (generalized)  Other lack of coordination  Rationale for Evaluation and Treatment: Rehabilitation  ONSET DATE: 2019  SUBJECTIVE:  SUBJECTIVE STATEMENT: I have done my exercises one time.   Fluid intake: Yes: water, diet coke last thing in evening, tea, coffee, OJ, ginger ale    PAIN:  Are you having pain? No   PRECAUTIONS: Other: Melanoma  RED FLAGS: None   WEIGHT BEARING RESTRICTIONS: No  FALLS:  Has patient fallen in last 6 months? No  LIVING ENVIRONMENT: Lives with: lives with their spouse  OCCUPATION: retired Runner, broadcasting/film/video, walking and yoga  PLOF: Independent  PATIENT GOALS: reduce urinary leakage  PERTINENT HISTORY:  Appendectomy; Hernia repair; MOHS Surgery for Melanoma, lymphocytic colitis  BOWEL MOVEMENT: No issues  URINATION: Pain with urination: No Fully empty bladder: No, she thinks she does and when stands up she is leaking Stream: Weak Urgency: Yes: and sometimes will leak, uses restroom before she goes somewhere Frequency: every 2 hours, wakes up 1-2 times  Leakage: Urge to void, Walking to the bathroom, Coughing, Sneezing, and hearing running water , brush teeth and will have urgency Pads: Yes: change 3 times per day  INTERCOURSE: not active   PREGNANCY:None  PROLAPSE: None   OBJECTIVE:  Note: Objective measures were completed at Evaluation unless otherwise noted.  DIAGNOSTIC FINDINGS:  none  PATIENT SURVEYS:  UIQ-7 29 PFIQ-7 28  COGNITION: Overall cognitive status: Within functional limits for tasks assessed     SENSATION: Light touch: Appears  intact Proprioception: Appears intact    POSTURE: No Significant postural limitations  PELVIC ALIGNMENT:  LUMBARAROM/PROM: full lumbar ROM    LOWER EXTREMITY [id number] hip ROM is full   LOWER EXTREMITY MMT:  MMT Right eval Left eval  Hip extension 4/5 4/5  Hip abduction 4/5 4/5   PALPATION:   General  Difficulty with diaphragmatic breathing and filling the abdomen up.                 External Perineal Exam intact with dryness                             Internal Pelvic Floor tightness along the sides of the introitus  Patient confirms identification and approves PT to assess internal pelvic floor and treatment Yes  PELVIC MMT:   MMT eval  Vaginal Initially 2/5 with weakness on the lateral sides; after manual work able to make more of a circular contraction with strength 3/5 and holding for 3 sec   (Blank rows = not tested)        TONE: Average    TODAY'S TREATMENT:     03/20/23 Manual: Soft tissue mobilization: Manual work to the diaphragm  and tissue rolling of the lower rib cage Neuromuscular re-education: Down training: Diaphragmatic breathing to open up the lower rib cage and filling the abdomen then contracting the abdomen with breathing out, tactile cues to bring upper rib cage downward Exercises: Stretches/mobility: Educated patient on how to place the moisturizer on the vulva area and massage in at night and demonstrated on the model Educated patient on bladder irritants and how they affect the bladder and how to fill out a bladder diary for 2 days.  Strengthening: One legged bridge 10 x each side Alternating hip flexion isometrics 10 x each side Supine transverse abdominus contraction   PATIENT EDUCATION:  03/20/23 Education details: Access Code: [email or web address], Educated patient on vaginal moisturizers and how to massage into the vulva and introitus, educated patient on bladder irritants and how to fill out a bladder diary Person educated:  Patient Education method: Explanation,  Demonstration, Tactile cues, Verbal cues, and Handouts Education comprehension: verbalized understanding, returned demonstration, verbal cues required, tactile cues required, and needs further education  HOME EXERCISE PROGRAM: 03/20/23 Access Code: [email or web address] URL: [email or web address] Date: 03/20/2023 Prepared by: Eulis Foster  Exercises - Supine Pelvic Floor Contraction  - 2 x daily - 7 x weekly - 1 sets - 10 reps - 4 sec hold - Seated Hip Adduction Isometrics with Ball  - 1 x daily - 7 x weekly - 1 sets - 10 reps - 4 sec hold - Supine Diaphragmatic Breathing  - 1 x daily - 7 x weekly - 1 sets - 10 reps - Hooklying Isometric Hip Flexion  - 1 x daily - 3 x weekly - 1 sets - 10 reps - Single Leg Bridge  - 1 x daily - 3 x weekly - 1 sets - 10 reps   ASSESSMENT:  CLINICAL IMPRESSION: Patient is a 67 y.o. female who was seen today for physical therapy  treatment for mixed incontinence.  Patient is learning how to contract the upper abdominals to bring the lower rib cage together. Patient reports she has less urinary leakage. She understands what bladder irritants are. She understands how to use vaginal moisturizers. Patient will benefit from skilled therapy to improve pelvic floor strength and coordination to reduce urinary leakage.   OBJECTIVE IMPAIRMENTS: decreased activity tolerance, decreased coordination, decreased strength, and increased fascial restrictions.   ACTIVITY LIMITATIONS: continence  PARTICIPATION LIMITATIONS: shopping and community activity  PERSONAL FACTORS: Time since onset of injury/illness/exacerbation are also affecting patient's functional outcome.   REHAB POTENTIAL: Excellent  CLINICAL DECISION MAKING: Stable/uncomplicated  EVALUATION COMPLEXITY: Low   GOALS: Goals reviewed with patient? Yes  SHORT TERM GOALS: Target date: 04/12/23  Patient educated on vaginal moisturizers and how it improves the health  of the tissue.  Baseline: Goal status: Met 03/20/23  2.  Patient independent with initial HEP for pelvic floor strength.  Baseline:  Goal status: INITIAL  3.  Patient educated on bladder irritants to reduce urgency.  Baseline:  Goal status: Met 03/20/23  4.  Patient educated on urge to void.  Baseline:  Goal status: INITIAL  5.  Patient is able to perform diaphragmatic breathing to elongate the pelvic floor.  Baseline:  Goal status: Met 03/20/23   LONG TERM GOALS: Target date: 05/10/23  Patient independent with advanced HEP for pelvic floor and core strength.  Baseline:  Goal status: INITIAL  2.  Patient reports she wears 0-1 pads per day due to increased strength of pelvic floor.  Baseline:  Goal status: INITIAL  3.  Patient is able to hear running water and brush her teeth without having the urge to void using her behavioral technique.  Baseline:  Goal status: INITIAL  4.  Patient is able to stand up after urinating and not dribble urine due to increased in strength.  Baseline:  Goal status: INITIAL  5.  Patient is able to have the urge to urinate and walk to the bathroom without urinary leakage.  Baseline:  Goal status: INITIAL   PLAN:  PT FREQUENCY: 1x/week  PT DURATION: 8 weeks  PLANNED INTERVENTIONS: 97110-Therapeutic exercises, 97530- Therapeutic activity, 97112- Neuromuscular re-education, 97140- Manual therapy, Patient/Family education, Dry Needling, and Biofeedback  PLAN FOR NEXT SESSION:  pelvic floor with core strength, urge to void, see how the moisturizers are going   Eulis Foster, PT 03/20/23 2:53 PM   PHYSICAL THERAPY DISCHARGE SUMMARY  Visits from Start of Care: 2  Current functional  level related to goals / functional outcomes: See above. Patient wanted to be discharged to work on her back.    Remaining deficits: See above.    Education / Equipment: HEP   Patient agrees to discharge. Patient goals were not met. Patient is being  discharged due to the patient's request. Thank you for the referral. .  Eulis Foster, PT 05/15/23 3:14 PM

## 2023-03-20 NOTE — Patient Instructions (Signed)
Bladder Irritants  Certain foods and beverages can be irritating to the bladder.  Avoiding these irritants may decrease your symptoms of urinary urgency, frequency or bladder pain.  Even reducing your intake can help with your symptoms.  Not everyone is sensitive to all bladder irritants, so you may consider focusing on one irritant at a time, removing or reducing your intake of that irritant for 7-10 days to see if this change helps your symptoms.  Water intake is also very important.  Below is a list of bladder irritants.  Drinks: alcohol, carbonated beverages, caffeinated beverages such as coffee and tea, drinks with artificial sweeteners, citrus juices, apple juice, tomato juice  Foods: tomatoes and tomato based foods, spicy food, sugar and artificial sweeteners, vinegar, chocolate, raw onion, apples, citrus fruits, pineapple, cranberries, tomatoes, strawberries, plums, peaches, cantaloupe  Other: acidic urine (too concentrated) - see water intake info below  Substitutes you can try that are NOT irritating to the bladder: cooked onion, pears, papayas, sun-brewed decaf teas, watermelons, non-citrus herbal teas, apricots, kava and low-acid instant drinks (Postum).    WATER INTAKE: Remember to drink lots of water (aim for fluid intake of half your body weight with 2/3 of fluids being water).  You may be limiting fluids due to fear of leakage, but this can actually worsen urgency symptoms due to highly concentrated urine.  Water helps balance the pH of your urine so it doesn't become too acidic - acidic urine is a bladder irritant  [facility] [address] [place], [place] [address] Phone # [phone] Fax [phone]

## 2023-03-30 ENCOUNTER — Encounter: Payer: Self-pay | Admitting: Physical Therapy

## 2023-04-20 DIAGNOSIS — L812 Freckles: Secondary | ICD-10-CM | POA: Diagnosis not present

## 2023-04-20 DIAGNOSIS — L821 Other seborrheic keratosis: Secondary | ICD-10-CM | POA: Diagnosis not present

## 2023-04-20 DIAGNOSIS — L817 Pigmented purpuric dermatosis: Secondary | ICD-10-CM | POA: Diagnosis not present

## 2023-04-20 DIAGNOSIS — L570 Actinic keratosis: Secondary | ICD-10-CM | POA: Diagnosis not present

## 2023-04-20 DIAGNOSIS — F331 Major depressive disorder, recurrent, moderate: Secondary | ICD-10-CM | POA: Diagnosis not present

## 2023-04-20 DIAGNOSIS — F411 Generalized anxiety disorder: Secondary | ICD-10-CM | POA: Diagnosis not present

## 2023-04-20 DIAGNOSIS — F1021 Alcohol dependence, in remission: Secondary | ICD-10-CM | POA: Diagnosis not present

## 2023-04-20 DIAGNOSIS — Z85828 Personal history of other malignant neoplasm of skin: Secondary | ICD-10-CM | POA: Diagnosis not present

## 2023-04-26 ENCOUNTER — Encounter: Payer: Self-pay | Admitting: Pulmonary Disease

## 2023-04-26 ENCOUNTER — Ambulatory Visit: Payer: Medicare PPO | Admitting: Pulmonary Disease

## 2023-04-26 VITALS — BP 118/68 | HR 63 | Temp 98.3°F | Ht 63.0 in | Wt 121.4 lb

## 2023-04-26 DIAGNOSIS — R059 Cough, unspecified: Secondary | ICD-10-CM

## 2023-04-26 DIAGNOSIS — R0600 Dyspnea, unspecified: Secondary | ICD-10-CM

## 2023-04-26 NOTE — Progress Notes (Signed)
 Nancy Smith    [id number]    30-Mar-1956  Primary Care Physician:Henson, Marcos Sevin, NP-C  Referring Physician: Abram Abraham, NP-C [address] [place],  [place] [address]  Chief complaint: Consult for abnormal chest x-ray, evaluate for COPD   HPI: 68 y.o. who  has a past medical history of Bronchitis, Colitis, appendicitis, and Hyperlipidemia.   Discussed the use of AI scribe software for clinical note transcription with the patient, who gave verbal consent to proceed.  The patient, a retired Runner, broadcasting/film/video, presents with concerns of possible COPD after a recent episode of bronchitis in the fall. The bronchitis lasted a few weeks and was treated with antibiotics and an inhaler. The patient reports a long-standing mild cough, which was initially diagnosed as acid reflux and improved with treatment. Recently, the patient has noticed a dry cough over the past couple of weeks.  She had a chest x-ray by primary care which showed hyperinflation suggestive of COPD and given a Trelegy inhaler.  She has not started the Trelegy inhaler as she is feeling much better.  The patient has a history of social smoking, quitting in the 1990s, and a family history of lung cancer and COPD. The patient is otherwise healthy and active, with no current respiratory symptoms.   Pets: Dog Occupation: Retired Engineer, site Exposures: No mold, hot tub, Jacuzzi.  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   cetirizine  (ZYRTEC  ALLERGY) 10 MG tablet Take 1 tablet (10 mg total) by mouth daily.   traZODone  (DESYREL ) 100 MG tablet Take 100 mg by mouth at bedtime.   [DISCONTINUED] buPROPion  (WELLBUTRIN   XL) 150 MG 24 hr tablet Take 150 mg by mouth daily.   [DISCONTINUED] calcium  carbonate (TUMS EX) 750 MG chewable tablet Chew 1 tablet by mouth daily.   [DISCONTINUED] Fluticasone -Umeclidin-Vilant (TRELEGY ELLIPTA ) 100-62.5-25 MCG/ACT AEPB Inhale 1 puff into the lungs daily.   [DISCONTINUED] traZODone  (DESYREL ) 50 MG tablet TAKE 1 TABLET(50 MG) BY MOUTH AT BEDTIME AS NEEDED FOR SLEEP   No facility-administered encounter medications on file as of 04/26/2023.   Physical Exam: Blood pressure 118/68, pulse 63, temperature 98.3 F (36.8 C), temperature source Oral, height 5\' 3"  (1.6 m), weight 121 lb 6.4 oz (55.1 kg), SpO2 99%. Gen:      No acute distress HEENT:  EOMI, sclera anicteric Neck:     No masses; no thyromegaly Lungs:    Clear to auscultation bilaterally; normal respiratory effort CV:         Regular rate and rhythm; no murmurs Abd:      + bowel sounds; soft, non-tender; no palpable masses, no distension Ext:    No edema; adequate peripheral perfusion Skin:      Warm and dry; no rash Neuro: alert and oriented x 3 Psych: normal mood and affect  Data Reviewed: Imaging: Chest x-ray 01/06/2023-chronic pulmonary hyperinflation, no acute cardiopulmonary abnormality I reviewed the images personally.  PFTs:  Labs:  Assessment and Plan Possible COPD History of social smoking, family history of lung cancer and COPD. Recent episode of bronchitis in the fall. Chest X-ray showed hyperinflation of lungs, but currently asymptomatic. No current use of prescribed Trelegy inhaler.  -Schedule  pulmonary function test in 3 months to confirm or rule out COPD. -Review results in follow-up appointment on the same day as the test.  Occasional Dry Cough Recent onset, no other associated symptoms. Possible sensitivity to certain triggers like hot peppers and certain store environments. -Monitor symptoms and discuss further if cough persists or worsens.   Recommendations: PFTs  Phyllis Breeze  MD [facility] 04/26/2023, 11:48 AM  CC: Nancy Back L, NP-C

## 2023-04-26 NOTE — Patient Instructions (Signed)
 VISIT SUMMARY:  During your visit, we discussed your concerns about possible COPD following a recent episode of bronchitis. We also addressed your occasional dry cough and reviewed your medical history, including your past smoking and family history of lung conditions.  YOUR PLAN:  -POSSIBLE COPD: COPD, or Chronic Obstructive Pulmonary Disease, is a long-term lung condition that makes it hard to breathe. Given your history of social smoking and family history of lung issues, we will schedule a pulmonary function test in 3 months to confirm or rule out COPD. We will review the results during a follow-up appointment on the same day as the test.  -OCCASIONAL DRY COUGH: A dry cough can be caused by various factors, including sensitivity to certain triggers. Since your cough is recent and you have no other symptoms, we will monitor it for now. If it persists or worsens, we will discuss further steps.  INSTRUCTIONS:  Please schedule a pulmonary function test in 3 months. We will review the results during a follow-up appointment on the same day as the test.

## 2023-04-27 ENCOUNTER — Encounter: Payer: Self-pay | Admitting: Family Medicine

## 2023-04-27 ENCOUNTER — Ambulatory Visit: Payer: Medicare PPO | Admitting: Family Medicine

## 2023-04-27 ENCOUNTER — Ambulatory Visit: Payer: Medicare PPO

## 2023-04-27 VITALS — BP 132/86 | HR 57 | Temp 97.6°F | Ht 63.0 in | Wt 121.0 lb

## 2023-04-27 DIAGNOSIS — M8580 Other specified disorders of bone density and structure, unspecified site: Secondary | ICD-10-CM

## 2023-04-27 DIAGNOSIS — E7800 Pure hypercholesterolemia, unspecified: Secondary | ICD-10-CM

## 2023-04-27 DIAGNOSIS — M549 Dorsalgia, unspecified: Secondary | ICD-10-CM

## 2023-04-27 DIAGNOSIS — E2839 Other primary ovarian failure: Secondary | ICD-10-CM

## 2023-04-27 DIAGNOSIS — M546 Pain in thoracic spine: Secondary | ICD-10-CM | POA: Diagnosis not present

## 2023-04-27 DIAGNOSIS — Z0001 Encounter for general adult medical examination with abnormal findings: Secondary | ICD-10-CM | POA: Diagnosis not present

## 2023-04-27 DIAGNOSIS — E559 Vitamin D deficiency, unspecified: Secondary | ICD-10-CM | POA: Diagnosis not present

## 2023-04-27 DIAGNOSIS — M4185 Other forms of scoliosis, thoracolumbar region: Secondary | ICD-10-CM | POA: Diagnosis not present

## 2023-04-27 DIAGNOSIS — M47814 Spondylosis without myelopathy or radiculopathy, thoracic region: Secondary | ICD-10-CM | POA: Diagnosis not present

## 2023-04-27 LAB — CBC WITH DIFFERENTIAL/PLATELET
Basophils Absolute: 0.1 10*3/uL (ref 0.0–0.1)
Basophils Relative: 0.9 % (ref 0.0–3.0)
Eosinophils Absolute: 0.2 10*3/uL (ref 0.0–0.7)
Eosinophils Relative: 3.2 % (ref 0.0–5.0)
HCT: 37.9 % (ref 36.0–46.0)
Hemoglobin: 12.6 g/dL (ref 12.0–15.0)
Lymphocytes Relative: 32.8 % (ref 12.0–46.0)
Lymphs Abs: 2.1 10*3/uL (ref 0.7–4.0)
MCHC: 33.1 g/dL (ref 30.0–36.0)
MCV: 86.7 fL (ref 78.0–100.0)
Monocytes Absolute: 0.5 10*3/uL (ref 0.1–1.0)
Monocytes Relative: 7.1 % (ref 3.0–12.0)
Neutro Abs: 3.6 10*3/uL (ref 1.4–7.7)
Neutrophils Relative %: 56 % (ref 43.0–77.0)
Platelets: 265 10*3/uL (ref 150.0–400.0)
RBC: 4.37 Mil/uL (ref 3.87–5.11)
RDW: 13.1 % (ref 11.5–15.5)
WBC: 6.5 10*3/uL (ref 4.0–10.5)

## 2023-04-27 LAB — COMPREHENSIVE METABOLIC PANEL
ALT: 10 U/L (ref 0–35)
AST: 15 U/L (ref 0–37)
Albumin: 4.4 g/dL (ref 3.5–5.2)
Alkaline Phosphatase: 56 U/L (ref 39–117)
BUN: 10 mg/dL (ref 6–23)
CO2: 30 meq/L (ref 19–32)
Calcium: 9.3 mg/dL (ref 8.4–10.5)
Chloride: 103 meq/L (ref 96–112)
Creatinine, Ser: 0.79 mg/dL (ref 0.40–1.20)
GFR: 76.27 mL/min (ref >60.00)
Glucose, Bld: 79 mg/dL (ref 70–99)
Potassium: 3.8 meq/L (ref 3.5–5.1)
Sodium: 140 meq/L (ref 135–145)
Total Bilirubin: 0.3 mg/dL (ref 0.2–1.2)
Total Protein: 6.8 g/dL (ref 6.0–8.3)

## 2023-04-27 LAB — LIPID PANEL
Cholesterol: 199 mg/dL (ref 0–200)
HDL: 60.7 mg/dL (ref >39.00)
LDL Cholesterol: 127 mg/dL — ABNORMAL HIGH (ref 0–99)
NonHDL: 138.2
Total CHOL/HDL Ratio: 3
Triglycerides: 57 mg/dL (ref 0.0–149.0)
VLDL: 11.4 mg/dL (ref 0.0–40.0)

## 2023-04-27 LAB — VITAMIN D 25 HYDROXY (VIT D DEFICIENCY, FRACTURES): VITD: 30.35 ng/mL (ref 30.00–100.00)

## 2023-04-27 NOTE — Patient Instructions (Addendum)
Check with your pharmacy regarding Tdap vaccine  Calcium Content in Foods Calcium is the most abundant mineral in the body. Most of the body's calcium supply is stored in bones and teeth. Calcium helps many parts of the body function normally, including: Blood and blood vessels. Nerves. Hormones. Muscles. Bones and teeth. When your calcium stores are low, you may be at risk for low bone mass, bone loss, and broken bones (fractures). When you get enough calcium, it helps to support strong bones and teeth throughout your life. Calcium is especially important for: Children during growth spurts. Girls during adolescence. Women who are pregnant or breastfeeding. Women after their menstrual cycle stops (postmenopause). Women whose menstrual cycle has stopped due to anorexia nervosa or regular intense exercise. People who cannot eat or digest dairy products. Vegans. Recommended daily amounts of calcium: Women (ages 32 to 28): 1,000 mg per day. Women (ages 31 and older): 1,200 mg per day. Men (ages 35 to 104): 1,000 mg per day. Men (ages 8 and older): 1,200 mg per day. Women (ages 50 to 39): 1,300 mg per day. Men (ages 11 to 69): 1,300 mg per day. General information Eat foods that are high in calcium. Try to get most of your calcium from food. Some people may benefit from taking calcium supplements. Check with your health care provider or diet and nutrition specialist (dietitian) before starting any calcium supplements. Calcium supplements may interact with certain medicines. Too much calcium may cause other health problems, such as constipation and kidney stones. For the body to absorb calcium, it needs vitamin D. Sources of vitamin D include: Skin exposure to direct sunlight. Foods, such as egg yolks, liver, mushrooms, saltwater fish, and fortified milk. Vitamin D supplements. Check with your health care provider or dietitian before starting any vitamin D supplements. What foods are high in  calcium?  Foods that are high in calcium contain more than 100 milligrams per serving. Fruits Fortified orange juice or other fruit juice, 300 mg per 8 oz serving. Vegetables Collard greens, 360 mg per 8 oz serving. Kale, 100 mg per 8 oz serving. Bok choy, 160 mg per 8 oz serving. Grains Fortified ready-to-eat cereals, 100 to 1,000 mg per 8 oz serving. Fortified frozen waffles, 200 mg in 2 waffles. Oatmeal, 140 mg in 1 cup. Meats and other proteins Sardines, canned with bones, 325 mg per 3 oz serving. Salmon, canned with bones, 180 mg per 3 oz serving. Canned shrimp, 125 mg per 3 oz serving. Baked beans, 160 mg per 4 oz serving. Tofu, firm, made with calcium sulfate, 253 mg per 4 oz serving. Dairy Yogurt, plain, low-fat, 310 mg per 6 oz serving. Nonfat milk, 300 mg per 8 oz serving. American cheese, 195 mg per 1 oz serving. Cheddar cheese, 205 mg per 1 oz serving. Cottage cheese 2%, 105 mg per 4 oz serving. Fortified soy, rice, or almond milk, 300 mg per 8 oz serving. Mozzarella, part skim, 210 mg per 1 oz serving. The items listed above may not be a complete list of foods high in calcium. Actual amounts of calcium may be different depending on processing. Contact a dietitian for more information. What foods are lower in calcium? Foods that are lower in calcium contain 50 mg or less per serving. Fruits Apple, about 6 mg. Banana, about 12 mg. Vegetables Lettuce, 19 mg per 2 oz serving. Tomato, about 11 mg. Grains Rice, 4 mg per 6 oz serving. Boiled potatoes, 14 mg per 8 oz serving. White bread,  6 mg per slice. Meats and other proteins Egg, 27 mg per 2 oz serving. Red meat, 7 mg per 4 oz serving. Chicken, 17 mg per 4 oz serving. Fish, cod, or trout, 20 mg per 4 oz serving. Dairy Cream cheese, regular, 14 mg per 1 Tbsp serving. Brie cheese, 50 mg per 1 oz serving. Parmesan cheese, 70 mg per 1 Tbsp serving. The items listed above may not be a complete list of foods lower  in calcium. Actual amounts of calcium may be different depending on processing. Contact a dietitian for more information. Summary Calcium is an important mineral in the body because it affects many functions. Getting enough calcium helps support strong bones and teeth throughout your life. Try to get most of your calcium from food. Calcium supplements may interact with certain medicines. Check with your health care provider or dietitian before starting any calcium supplements. This information is not intended to replace advice given to you by your health care provider. Make sure you discuss any questions you have with your health care provider. Document Revised: 07/24/2019 Document Reviewed: 07/24/2019 Elsevier Patient Education  2024 [employer].

## 2023-04-27 NOTE — Progress Notes (Signed)
Complete physical exam  Patient: Nancy Smith   DOB: July 01, 1955   68 y.o. Female  MRN: [id number]  Subjective:    Chief Complaint  Patient presents with   Back Pain   She is here for a complete physical exam.  Other providers: OB/GYN- Dr. Kennith Center  GI- Dr. Marina Goodell  Dermatologist Psychiatrist  Pulmonologist   Hx of osteopenia Mother had osteoporosis    C/o right posterior rib pain. No injury.    Health Maintenance  Topic Date Due   Pneumonia Vaccine (2 of 2 - PPSV23 or PCV20) 04/19/2019   DTaP/Tdap/Td vaccine (2 - Tdap) 10/20/2020   Medicare Annual Wellness Visit  04/20/2023   Flu Shot  07/10/2023*   Mammogram  10/27/2023   Colon Cancer Screening  04/11/2024   DEXA scan (bone density measurement)  Completed   Zoster (Shingles) Vaccine  Completed   HPV Vaccine  Aged Out   COVID-19 Vaccine  Discontinued   Hepatitis C Screening  Discontinued  *Topic was postponed. The date shown is not the original due date.    Wears seatbelt always, smoke detectors in home and functioning, does not text while driving, feels safe in home environment.  Depression screening:    03/06/2023   10:17 AM 09/21/2022    3:08 PM 09/21/2022    3:05 PM  Depression screen PHQ 2/9  Decreased Interest 1 0 0  Down, Depressed, Hopeless 1 1 0  PHQ - 2 Score 2 1 0  Altered sleeping 0 0   Tired, decreased energy 1 0   Change in appetite 0 0   Feeling bad or failure about yourself  0 0   Trouble concentrating 0 0   Moving slowly or fidgety/restless 0 0   Suicidal thoughts 0 0   PHQ-9 Score 3 1   Difficult doing work/chores Not difficult at all     Anxiety Screening:    02/17/2021   11:04 AM 08/11/2020    4:03 PM  GAD 7 : Generalized Anxiety Score  Nervous, Anxious, on Edge 1 3  Control/stop worrying 1 1  Worry too much - different things 1 2  Trouble relaxing 0 0  Restless 0 0  Easily annoyed or irritable 0 0  Afraid - awful might happen 1 0  Total GAD 7 Score 4 6  Anxiety Difficulty  Somewhat difficult Not difficult at all    Vision:Within last year and Dental: No current dental problems and Receives regular dental care  Patient Active Problem List   Diagnosis Date Noted   Vitamin D deficiency 04/27/2023   Well woman exam with routine gynecological exam 03/06/2023   Encounter for general adult medical examination with abnormal findings 04/19/2022   Urinary frequency 04/19/2022   Lymphocytic colitis 04/19/2022   Left upper quadrant pain 04/19/2022   Seasonal allergies 11/08/2021   Depression, recurrent (HCC) 11/08/2021   History of melanoma 09/09/2019   History of basal cell carcinoma of skin 09/09/2019   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   Osteopenia 09/09/2019   Dense breast tissue on mammogram 09/09/2019   Hypercholesterolemia 09/09/2019   History of colon polyps 09/09/2019   Hemangioma of liver 09/09/2019   Hepatic cyst 09/09/2019   History of posterior vitreous detachment 09/09/2019   Anxiety and depression 09/02/2019   Gastroesophageal reflux disease 09/02/2019   Past Medical History:  Diagnosis Date   Bronchitis    Colitis    Lymphocytic colitis; had 2 flares in her lifetime   Hx of appendicitis  Hyperlipidemia    Past Surgical History:  Procedure Laterality Date   APPENDECTOMY     EYE SURGERY  1996   HERNIA REPAIR  1983   Femoral   MOHS SURGERY     for Melanoma   TONSILLECTOMY  1960   Social History   Tobacco Use   Smoking status: Former    Current packs/day: 0.00    Average packs/day: 0.3 packs/day for 15.0 years (3.8 ttl pk-yrs)    Types: Cigarettes    Start date: 60    Quit date: 36    Years since quitting: 31.0   Smokeless tobacco: Never  Vaping Use   Vaping status: Never Used  Substance Use Topics   Alcohol use: Not Currently   Drug use: Never      Patient Care Team: Avanell Shackleton, NP-C as PCP - General (Family Medicine) Maisie Fus, MD as PCP - Cardiology (Cardiology) Manning Charity, OD as Referring  Physician (Optometry)   Outpatient Medications Prior to Visit  Medication Sig   buPROPion (WELLBUTRIN XL) 300 MG 24 hr tablet Take 300 mg by mouth daily.   cetirizine (ZYRTEC ALLERGY) 10 MG tablet Take 1 tablet (10 mg total) by mouth daily.   traZODone (DESYREL) 100 MG tablet Take 100 mg by mouth at bedtime.   No facility-administered medications prior to visit.    Review of Systems  Constitutional:  Negative for chills and fever.  HENT:  Negative for congestion, ear pain, sinus pain and sore throat.   Eyes:  Negative for blurred vision, double vision and pain.  Respiratory:  Negative for cough, shortness of breath and wheezing.   Cardiovascular:  Negative for chest pain, palpitations and leg swelling.  Gastrointestinal:  Negative for abdominal pain, nausea and vomiting.  Musculoskeletal:  Positive for back pain. Negative for joint pain and myalgias.  Skin:  Negative for rash.  Neurological:  Negative for dizziness, tingling, focal weakness and headaches.  Psychiatric/Behavioral:  Negative for depression. The patient is not nervous/anxious.        Objective:    BP 132/86 (BP Location: Left Arm, Patient Position: Sitting, Cuff Size: Normal)   Pulse (!) 57   Temp 97.6 F (36.4 C) (Temporal)   Ht 5\' 3"  (1.6 m)   Wt 121 lb (54.9 kg)   SpO2 96%   BMI 21.43 kg/m  BP Readings from Last 3 Encounters:  04/27/23 132/86  04/26/23 118/68  03/06/23 135/76   Wt Readings from Last 3 Encounters:  04/27/23 121 lb (54.9 kg)  04/26/23 121 lb 6.4 oz (55.1 kg)  03/06/23 119 lb (54 kg)    Physical Exam Constitutional:      General: She is not in acute distress. HENT:     Right Ear: Tympanic membrane, ear canal and external ear normal.     Left Ear: Tympanic membrane, ear canal and external ear normal.     Nose: Nose normal.     Mouth/Throat:     Mouth: Mucous membranes are moist.     Pharynx: Oropharynx is clear.  Eyes:     Extraocular Movements: Extraocular movements intact.      Conjunctiva/sclera: Conjunctivae normal.     Pupils: Pupils are equal, round, and reactive to light.  Neck:     Thyroid: No thyroid mass, thyromegaly or thyroid tenderness.  Cardiovascular:     Rate and Rhythm: Normal rate and regular rhythm.     Pulses: Normal pulses.     Heart sounds: Normal heart sounds.  Pulmonary:     Effort: Pulmonary effort is normal.     Breath sounds: Normal breath sounds.  Abdominal:     General: Bowel sounds are normal.     Palpations: Abdomen is soft.     Tenderness: There is no abdominal tenderness. There is no right CVA tenderness, left CVA tenderness, guarding or rebound.  Musculoskeletal:        General: Normal range of motion.     Cervical back: Normal range of motion and neck supple. No tenderness.       Back:     Right lower leg: No edema.     Left lower leg: No edema.     Comments: Focal pain   Lymphadenopathy:     Cervical: No cervical adenopathy.  Skin:    General: Skin is warm and dry.     Findings: No lesion or rash.  Neurological:     General: No focal deficit present.     Mental Status: She is alert and oriented to person, place, and time.     Cranial Nerves: No cranial nerve deficit.     Sensory: No sensory deficit.     Motor: No weakness.     Gait: Gait normal.  Psychiatric:        Mood and Affect: Mood normal.        Behavior: Behavior normal.        Thought Content: Thought content normal.      Results for orders placed or performed in visit on 04/27/23  VITAMIN D 25 Hydroxy (Vit-D Deficiency, Fractures)  Result Value Ref Range   VITD 30.35 30.00 - 100.00 ng/mL  Lipid panel  Result Value Ref Range   Cholesterol 199 0 - 200 mg/dL   Triglycerides 16.1 0.0 - 149.0 mg/dL   HDL 09.60 >45.40 mg/dL   VLDL 98.1 0.0 - 19.1 mg/dL   LDL Cholesterol 478 (H) 0 - 99 mg/dL   Total CHOL/HDL Ratio 3    NonHDL 138.20   Comprehensive metabolic panel  Result Value Ref Range   Sodium 140 135 - 145 mEq/L   Potassium 3.8 3.5 - 5.1  mEq/L   Chloride 103 96 - 112 mEq/L   CO2 30 19 - 32 mEq/L   Glucose, Bld 79 70 - 99 mg/dL   BUN 10 6 - 23 mg/dL   Creatinine, Ser 2.95 0.40 - 1.20 mg/dL   Total Bilirubin 0.3 0.2 - 1.2 mg/dL   Alkaline Phosphatase 56 39 - 117 U/L   AST 15 0 - 37 U/L   ALT 10 0 - 35 U/L   Total Protein 6.8 6.0 - 8.3 g/dL   Albumin 4.4 3.5 - 5.2 g/dL   GFR 62.13 >08.65 mL/min   Calcium 9.3 8.4 - 10.5 mg/dL  CBC with Differential/Platelet  Result Value Ref Range   WBC 6.5 4.0 - 10.5 K/uL   RBC 4.37 3.87 - 5.11 Mil/uL   Hemoglobin 12.6 12.0 - 15.0 g/dL   HCT 78.4 69.6 - 29.5 %   MCV 86.7 78.0 - 100.0 fl   MCHC 33.1 30.0 - 36.0 g/dL   RDW 28.4 13.2 - 44.0 %   Platelets 265.0 150.0 - 400.0 K/uL   Neutrophils Relative % 56.0 43.0 - 77.0 %   Lymphocytes Relative 32.8 12.0 - 46.0 %   Monocytes Relative 7.1 3.0 - 12.0 %   Eosinophils Relative 3.2 0.0 - 5.0 %   Basophils Relative 0.9 0.0 - 3.0 %   Neutro Abs 3.6  1.4 - 7.7 K/uL   Lymphs Abs 2.1 0.7 - 4.0 K/uL   Monocytes Absolute 0.5 0.1 - 1.0 K/uL   Eosinophils Absolute 0.2 0.0 - 0.7 K/uL   Basophils Absolute 0.1 0.0 - 0.1 K/uL      Assessment & Plan:    Routine Health Maintenance and Physical Exam  Problem List Items Addressed This Visit     Hypercholesterolemia (Chronic)   Relevant Orders   Lipid panel (Completed)   Osteopenia (Chronic)   Relevant Orders   DG Bone Density   VITAMIN D 25 Hydroxy (Vit-D Deficiency, Fractures) (Completed)   Encounter for general adult medical examination with abnormal findings - Primary   Relevant Orders   CBC with Differential/Platelet (Completed)   Comprehensive metabolic panel (Completed)   Vitamin D deficiency   Relevant Orders   VITAMIN D 25 Hydroxy (Vit-D Deficiency, Fractures) (Completed)   Other Visit Diagnoses       Estrogen deficiency       Relevant Orders   DG Bone Density     Mid back pain on right side       Relevant Orders   DG Thoracic Spine W/Swimmers      Preventive health  care reviewed.  Counseling on healthy lifestyle including diet and exercise.  Recommend regular dental and eye exams.  Immunizations reviewed.  Discussed safety. Continue close follow up with specialists as recommended. Discuss getting adequate calcium and vitamin D. Weight bearing exercises for bone health.  DEXA ordered and she will call to schedule when due after 07/10/2023 X ray ordered due to right mid back pain.  Follow up pending vitamin D level and cholesterol.   Calculated ASCVD 10 year risk 8.8%. discuss starting statin vs getting CT Coronary Calcium score vs cardiology referral.   The 10-year ASCVD risk score (Arnett DK, et al., 2019) is: 8.8%   Values used to calculate the score:     Age: 40 years     Sex: Female     Is Non-Hispanic African American: No     Diabetic: No     Tobacco smoker: No     Systolic Blood Pressure: 132 mmHg     Is BP treated: No     HDL Cholesterol: 60.7 mg/dL     Total Cholesterol: 199 mg/dL    Return in about 1 year (around 04/26/2024).     Hetty Blend, NP-C

## 2023-04-28 ENCOUNTER — Encounter: Payer: Self-pay | Admitting: Family Medicine

## 2023-05-01 ENCOUNTER — Encounter: Payer: Self-pay | Admitting: Family Medicine

## 2023-05-01 NOTE — Progress Notes (Signed)
Please reach out to her:  "Your LDL (the bad cholesterol) is slightly elevated at 127. When I calculate your 10 year ASVCD risk (risk of developing heart disease, having a heart attack or stroke), it comes up as intermediate. I recommend starting on a medication to help lower your cholesterol such as Lipitor or Crestor. We can start at the lowest dose. Please let me know if you are ok with this and I will send it to your pharmacy. If you agree to start the medication, I recommend a fasting follow up in 6-8 weeks to recheck your cholesterol. Your labs are fine otherwise". We can also discuss a CT coronary calcium test at her follow up.   The 10-year ASCVD risk score (Arnett DK, et al., 2019) is: 8.8%   Values used to calculate the score:     Age: 68 years     Sex: Female     Is Non-Hispanic African American: No     Diabetic: No     Tobacco smoker: No     Systolic Blood Pressure: 132 mmHg     Is BP treated: No     HDL Cholesterol: 60.7 mg/dL     Total Cholesterol: 199 mg/dL

## 2023-05-02 ENCOUNTER — Encounter: Payer: Self-pay | Admitting: Family Medicine

## 2023-05-02 ENCOUNTER — Other Ambulatory Visit: Payer: Self-pay | Admitting: Family Medicine

## 2023-05-02 DIAGNOSIS — E7800 Pure hypercholesterolemia, unspecified: Secondary | ICD-10-CM

## 2023-05-02 MED ORDER — ROSUVASTATIN CALCIUM 5 MG PO TABS: 5.0000 mg | ORAL_TABLET | Freq: Every day | ORAL | 1 refills | Status: DC

## 2023-05-03 ENCOUNTER — Encounter: Payer: Self-pay | Admitting: Family Medicine

## 2023-05-03 DIAGNOSIS — F331 Major depressive disorder, recurrent, moderate: Secondary | ICD-10-CM | POA: Diagnosis not present

## 2023-05-03 DIAGNOSIS — F1021 Alcohol dependence, in remission: Secondary | ICD-10-CM | POA: Diagnosis not present

## 2023-05-03 DIAGNOSIS — F411 Generalized anxiety disorder: Secondary | ICD-10-CM | POA: Diagnosis not present

## 2023-05-05 ENCOUNTER — Encounter: Payer: Self-pay | Admitting: Physical Therapy

## 2023-05-10 DIAGNOSIS — M47816 Spondylosis without myelopathy or radiculopathy, lumbar region: Secondary | ICD-10-CM | POA: Diagnosis not present

## 2023-05-10 DIAGNOSIS — M549 Dorsalgia, unspecified: Secondary | ICD-10-CM | POA: Diagnosis not present

## 2023-05-12 DIAGNOSIS — M5451 Vertebrogenic low back pain: Secondary | ICD-10-CM | POA: Diagnosis not present

## 2023-05-12 DIAGNOSIS — R293 Abnormal posture: Secondary | ICD-10-CM | POA: Diagnosis not present

## 2023-05-12 DIAGNOSIS — M6259 Muscle wasting and atrophy, not elsewhere classified, multiple sites: Secondary | ICD-10-CM | POA: Diagnosis not present

## 2023-05-16 DIAGNOSIS — M5451 Vertebrogenic low back pain: Secondary | ICD-10-CM | POA: Diagnosis not present

## 2023-05-16 DIAGNOSIS — R293 Abnormal posture: Secondary | ICD-10-CM | POA: Diagnosis not present

## 2023-05-16 DIAGNOSIS — M6259 Muscle wasting and atrophy, not elsewhere classified, multiple sites: Secondary | ICD-10-CM | POA: Diagnosis not present

## 2023-05-17 DIAGNOSIS — F411 Generalized anxiety disorder: Secondary | ICD-10-CM | POA: Diagnosis not present

## 2023-05-17 DIAGNOSIS — F331 Major depressive disorder, recurrent, moderate: Secondary | ICD-10-CM | POA: Diagnosis not present

## 2023-05-18 DIAGNOSIS — F1021 Alcohol dependence, in remission: Secondary | ICD-10-CM | POA: Diagnosis not present

## 2023-05-18 DIAGNOSIS — M5451 Vertebrogenic low back pain: Secondary | ICD-10-CM | POA: Diagnosis not present

## 2023-05-18 DIAGNOSIS — R293 Abnormal posture: Secondary | ICD-10-CM | POA: Diagnosis not present

## 2023-05-18 DIAGNOSIS — M6259 Muscle wasting and atrophy, not elsewhere classified, multiple sites: Secondary | ICD-10-CM | POA: Diagnosis not present

## 2023-05-18 DIAGNOSIS — F331 Major depressive disorder, recurrent, moderate: Secondary | ICD-10-CM | POA: Diagnosis not present

## 2023-05-18 DIAGNOSIS — F411 Generalized anxiety disorder: Secondary | ICD-10-CM | POA: Diagnosis not present

## 2023-05-22 DIAGNOSIS — M5451 Vertebrogenic low back pain: Secondary | ICD-10-CM | POA: Diagnosis not present

## 2023-05-22 DIAGNOSIS — R293 Abnormal posture: Secondary | ICD-10-CM | POA: Diagnosis not present

## 2023-05-22 DIAGNOSIS — M6259 Muscle wasting and atrophy, not elsewhere classified, multiple sites: Secondary | ICD-10-CM | POA: Diagnosis not present

## 2023-05-24 DIAGNOSIS — M519 Unspecified thoracic, thoracolumbar and lumbosacral intervertebral disc disorder: Secondary | ICD-10-CM | POA: Diagnosis not present

## 2023-05-25 ENCOUNTER — Other Ambulatory Visit: Payer: Self-pay | Admitting: Student in an Organized Health Care Education/Training Program

## 2023-05-25 DIAGNOSIS — M519 Unspecified thoracic, thoracolumbar and lumbosacral intervertebral disc disorder: Secondary | ICD-10-CM

## 2023-05-29 ENCOUNTER — Encounter: Payer: Self-pay | Admitting: Physical Therapy

## 2023-05-30 ENCOUNTER — Encounter: Payer: Self-pay | Admitting: Student in an Organized Health Care Education/Training Program

## 2023-05-30 DIAGNOSIS — M5451 Vertebrogenic low back pain: Secondary | ICD-10-CM | POA: Diagnosis not present

## 2023-05-30 DIAGNOSIS — R293 Abnormal posture: Secondary | ICD-10-CM | POA: Diagnosis not present

## 2023-05-30 DIAGNOSIS — M6259 Muscle wasting and atrophy, not elsewhere classified, multiple sites: Secondary | ICD-10-CM | POA: Diagnosis not present

## 2023-06-01 DIAGNOSIS — M6259 Muscle wasting and atrophy, not elsewhere classified, multiple sites: Secondary | ICD-10-CM | POA: Diagnosis not present

## 2023-06-01 DIAGNOSIS — R293 Abnormal posture: Secondary | ICD-10-CM | POA: Diagnosis not present

## 2023-06-01 DIAGNOSIS — M5451 Vertebrogenic low back pain: Secondary | ICD-10-CM | POA: Diagnosis not present

## 2023-06-04 ENCOUNTER — Ambulatory Visit
Admission: RE | Admit: 2023-06-04 | Discharge: 2023-06-04 | Disposition: A | Discharge status: Home / Self Care | Payer: Medicare PPO | Source: Ambulatory Visit | Attending: Student in an Organized Health Care Education/Training Program | Admitting: Student in an Organized Health Care Education/Training Program

## 2023-06-04 DIAGNOSIS — M519 Unspecified thoracic, thoracolumbar and lumbosacral intervertebral disc disorder: Secondary | ICD-10-CM

## 2023-06-04 DIAGNOSIS — M5124 Other intervertebral disc displacement, thoracic region: Secondary | ICD-10-CM | POA: Diagnosis not present

## 2023-06-05 ENCOUNTER — Ambulatory Visit: Payer: Medicare PPO | Admitting: Physical Therapy

## 2023-06-06 DIAGNOSIS — M5451 Vertebrogenic low back pain: Secondary | ICD-10-CM | POA: Diagnosis not present

## 2023-06-06 DIAGNOSIS — R293 Abnormal posture: Secondary | ICD-10-CM | POA: Diagnosis not present

## 2023-06-06 DIAGNOSIS — M6259 Muscle wasting and atrophy, not elsewhere classified, multiple sites: Secondary | ICD-10-CM | POA: Diagnosis not present

## 2023-06-08 DIAGNOSIS — M6259 Muscle wasting and atrophy, not elsewhere classified, multiple sites: Secondary | ICD-10-CM | POA: Diagnosis not present

## 2023-06-08 DIAGNOSIS — R293 Abnormal posture: Secondary | ICD-10-CM | POA: Diagnosis not present

## 2023-06-08 DIAGNOSIS — M5451 Vertebrogenic low back pain: Secondary | ICD-10-CM | POA: Diagnosis not present

## 2023-06-09 DIAGNOSIS — M5431 Sciatica, right side: Secondary | ICD-10-CM | POA: Diagnosis not present

## 2023-06-09 DIAGNOSIS — M47816 Spondylosis without myelopathy or radiculopathy, lumbar region: Secondary | ICD-10-CM | POA: Diagnosis not present

## 2023-06-12 ENCOUNTER — Encounter: Payer: Medicare PPO | Admitting: Physical Therapy

## 2023-06-13 DIAGNOSIS — M6259 Muscle wasting and atrophy, not elsewhere classified, multiple sites: Secondary | ICD-10-CM | POA: Diagnosis not present

## 2023-06-13 DIAGNOSIS — R293 Abnormal posture: Secondary | ICD-10-CM | POA: Diagnosis not present

## 2023-06-13 DIAGNOSIS — M5451 Vertebrogenic low back pain: Secondary | ICD-10-CM | POA: Diagnosis not present

## 2023-06-15 DIAGNOSIS — M5451 Vertebrogenic low back pain: Secondary | ICD-10-CM | POA: Diagnosis not present

## 2023-06-15 DIAGNOSIS — M6259 Muscle wasting and atrophy, not elsewhere classified, multiple sites: Secondary | ICD-10-CM | POA: Diagnosis not present

## 2023-06-15 DIAGNOSIS — R293 Abnormal posture: Secondary | ICD-10-CM | POA: Diagnosis not present

## 2023-06-20 DIAGNOSIS — M6259 Muscle wasting and atrophy, not elsewhere classified, multiple sites: Secondary | ICD-10-CM | POA: Diagnosis not present

## 2023-06-20 DIAGNOSIS — R293 Abnormal posture: Secondary | ICD-10-CM | POA: Diagnosis not present

## 2023-06-20 DIAGNOSIS — M5451 Vertebrogenic low back pain: Secondary | ICD-10-CM | POA: Diagnosis not present

## 2023-06-21 DIAGNOSIS — F411 Generalized anxiety disorder: Secondary | ICD-10-CM | POA: Diagnosis not present

## 2023-06-21 DIAGNOSIS — F1021 Alcohol dependence, in remission: Secondary | ICD-10-CM | POA: Diagnosis not present

## 2023-06-21 DIAGNOSIS — F331 Major depressive disorder, recurrent, moderate: Secondary | ICD-10-CM | POA: Diagnosis not present

## 2023-06-22 DIAGNOSIS — M6259 Muscle wasting and atrophy, not elsewhere classified, multiple sites: Secondary | ICD-10-CM | POA: Diagnosis not present

## 2023-06-22 DIAGNOSIS — M5451 Vertebrogenic low back pain: Secondary | ICD-10-CM | POA: Diagnosis not present

## 2023-06-22 DIAGNOSIS — R293 Abnormal posture: Secondary | ICD-10-CM | POA: Diagnosis not present

## 2023-06-27 DIAGNOSIS — R293 Abnormal posture: Secondary | ICD-10-CM | POA: Diagnosis not present

## 2023-06-27 DIAGNOSIS — M6259 Muscle wasting and atrophy, not elsewhere classified, multiple sites: Secondary | ICD-10-CM | POA: Diagnosis not present

## 2023-06-27 DIAGNOSIS — M5451 Vertebrogenic low back pain: Secondary | ICD-10-CM | POA: Diagnosis not present

## 2023-06-28 DIAGNOSIS — F331 Major depressive disorder, recurrent, moderate: Secondary | ICD-10-CM | POA: Diagnosis not present

## 2023-06-28 DIAGNOSIS — F1021 Alcohol dependence, in remission: Secondary | ICD-10-CM | POA: Diagnosis not present

## 2023-06-28 DIAGNOSIS — F411 Generalized anxiety disorder: Secondary | ICD-10-CM | POA: Diagnosis not present

## 2023-06-29 DIAGNOSIS — M6259 Muscle wasting and atrophy, not elsewhere classified, multiple sites: Secondary | ICD-10-CM | POA: Diagnosis not present

## 2023-06-29 DIAGNOSIS — R293 Abnormal posture: Secondary | ICD-10-CM | POA: Diagnosis not present

## 2023-06-29 DIAGNOSIS — M5451 Vertebrogenic low back pain: Secondary | ICD-10-CM | POA: Diagnosis not present

## 2023-07-06 DIAGNOSIS — R293 Abnormal posture: Secondary | ICD-10-CM | POA: Diagnosis not present

## 2023-07-06 DIAGNOSIS — M6259 Muscle wasting and atrophy, not elsewhere classified, multiple sites: Secondary | ICD-10-CM | POA: Diagnosis not present

## 2023-07-06 DIAGNOSIS — M5451 Vertebrogenic low back pain: Secondary | ICD-10-CM | POA: Diagnosis not present

## 2023-07-11 DIAGNOSIS — M6259 Muscle wasting and atrophy, not elsewhere classified, multiple sites: Secondary | ICD-10-CM | POA: Diagnosis not present

## 2023-07-11 DIAGNOSIS — R293 Abnormal posture: Secondary | ICD-10-CM | POA: Diagnosis not present

## 2023-07-11 DIAGNOSIS — M5451 Vertebrogenic low back pain: Secondary | ICD-10-CM | POA: Diagnosis not present

## 2023-07-12 DIAGNOSIS — F411 Generalized anxiety disorder: Secondary | ICD-10-CM | POA: Diagnosis not present

## 2023-07-12 DIAGNOSIS — F331 Major depressive disorder, recurrent, moderate: Secondary | ICD-10-CM | POA: Diagnosis not present

## 2023-07-12 DIAGNOSIS — F1021 Alcohol dependence, in remission: Secondary | ICD-10-CM | POA: Diagnosis not present

## 2023-07-13 DIAGNOSIS — M6259 Muscle wasting and atrophy, not elsewhere classified, multiple sites: Secondary | ICD-10-CM | POA: Diagnosis not present

## 2023-07-13 DIAGNOSIS — R293 Abnormal posture: Secondary | ICD-10-CM | POA: Diagnosis not present

## 2023-07-13 DIAGNOSIS — M5451 Vertebrogenic low back pain: Secondary | ICD-10-CM | POA: Diagnosis not present

## 2023-07-19 DIAGNOSIS — F411 Generalized anxiety disorder: Secondary | ICD-10-CM | POA: Diagnosis not present

## 2023-07-19 DIAGNOSIS — F1021 Alcohol dependence, in remission: Secondary | ICD-10-CM | POA: Diagnosis not present

## 2023-07-19 DIAGNOSIS — F331 Major depressive disorder, recurrent, moderate: Secondary | ICD-10-CM | POA: Diagnosis not present

## 2023-07-24 DIAGNOSIS — M4155 Other secondary scoliosis, thoracolumbar region: Secondary | ICD-10-CM | POA: Diagnosis not present

## 2023-07-24 DIAGNOSIS — M4150 Other secondary scoliosis, site unspecified: Secondary | ICD-10-CM | POA: Diagnosis not present

## 2023-07-24 DIAGNOSIS — Z87891 Personal history of nicotine dependence: Secondary | ICD-10-CM | POA: Diagnosis not present

## 2023-07-24 DIAGNOSIS — M47816 Spondylosis without myelopathy or radiculopathy, lumbar region: Secondary | ICD-10-CM | POA: Diagnosis not present

## 2023-07-24 DIAGNOSIS — M546 Pain in thoracic spine: Secondary | ICD-10-CM | POA: Diagnosis not present

## 2023-07-24 DIAGNOSIS — M8580 Other specified disorders of bone density and structure, unspecified site: Secondary | ICD-10-CM | POA: Diagnosis not present

## 2023-07-24 DIAGNOSIS — M48061 Spinal stenosis, lumbar region without neurogenic claudication: Secondary | ICD-10-CM | POA: Diagnosis not present

## 2023-07-24 DIAGNOSIS — G8929 Other chronic pain: Secondary | ICD-10-CM | POA: Diagnosis not present

## 2023-07-24 DIAGNOSIS — M4316 Spondylolisthesis, lumbar region: Secondary | ICD-10-CM | POA: Diagnosis not present

## 2023-07-25 DIAGNOSIS — M5451 Vertebrogenic low back pain: Secondary | ICD-10-CM | POA: Diagnosis not present

## 2023-07-25 DIAGNOSIS — M6259 Muscle wasting and atrophy, not elsewhere classified, multiple sites: Secondary | ICD-10-CM | POA: Diagnosis not present

## 2023-07-25 DIAGNOSIS — R293 Abnormal posture: Secondary | ICD-10-CM | POA: Diagnosis not present

## 2023-07-27 DIAGNOSIS — R293 Abnormal posture: Secondary | ICD-10-CM | POA: Diagnosis not present

## 2023-07-27 DIAGNOSIS — M6259 Muscle wasting and atrophy, not elsewhere classified, multiple sites: Secondary | ICD-10-CM | POA: Diagnosis not present

## 2023-07-27 DIAGNOSIS — M5451 Vertebrogenic low back pain: Secondary | ICD-10-CM | POA: Diagnosis not present

## 2023-08-01 DIAGNOSIS — R293 Abnormal posture: Secondary | ICD-10-CM | POA: Diagnosis not present

## 2023-08-01 DIAGNOSIS — M6259 Muscle wasting and atrophy, not elsewhere classified, multiple sites: Secondary | ICD-10-CM | POA: Diagnosis not present

## 2023-08-01 DIAGNOSIS — M5451 Vertebrogenic low back pain: Secondary | ICD-10-CM | POA: Diagnosis not present

## 2023-08-03 DIAGNOSIS — M5451 Vertebrogenic low back pain: Secondary | ICD-10-CM | POA: Diagnosis not present

## 2023-08-03 DIAGNOSIS — R293 Abnormal posture: Secondary | ICD-10-CM | POA: Diagnosis not present

## 2023-08-03 DIAGNOSIS — M6259 Muscle wasting and atrophy, not elsewhere classified, multiple sites: Secondary | ICD-10-CM | POA: Diagnosis not present

## 2023-08-04 ENCOUNTER — Other Ambulatory Visit: Payer: Self-pay | Admitting: Neurosurgery

## 2023-08-04 DIAGNOSIS — M546 Pain in thoracic spine: Secondary | ICD-10-CM

## 2023-08-04 DIAGNOSIS — G8929 Other chronic pain: Secondary | ICD-10-CM

## 2023-08-04 DIAGNOSIS — M4150 Other secondary scoliosis, site unspecified: Secondary | ICD-10-CM

## 2023-08-08 DIAGNOSIS — M5451 Vertebrogenic low back pain: Secondary | ICD-10-CM | POA: Diagnosis not present

## 2023-08-08 DIAGNOSIS — R293 Abnormal posture: Secondary | ICD-10-CM | POA: Diagnosis not present

## 2023-08-08 DIAGNOSIS — M6259 Muscle wasting and atrophy, not elsewhere classified, multiple sites: Secondary | ICD-10-CM | POA: Diagnosis not present

## 2023-08-10 DIAGNOSIS — M5451 Vertebrogenic low back pain: Secondary | ICD-10-CM | POA: Diagnosis not present

## 2023-08-10 DIAGNOSIS — M6259 Muscle wasting and atrophy, not elsewhere classified, multiple sites: Secondary | ICD-10-CM | POA: Diagnosis not present

## 2023-08-10 DIAGNOSIS — R293 Abnormal posture: Secondary | ICD-10-CM | POA: Diagnosis not present

## 2023-08-14 ENCOUNTER — Ambulatory Visit: Payer: Medicare PPO | Admitting: Pulmonary Disease

## 2023-08-15 DIAGNOSIS — M5451 Vertebrogenic low back pain: Secondary | ICD-10-CM | POA: Diagnosis not present

## 2023-08-15 DIAGNOSIS — R293 Abnormal posture: Secondary | ICD-10-CM | POA: Diagnosis not present

## 2023-08-15 DIAGNOSIS — M6259 Muscle wasting and atrophy, not elsewhere classified, multiple sites: Secondary | ICD-10-CM | POA: Diagnosis not present

## 2023-08-17 ENCOUNTER — Ambulatory Visit
Admission: RE | Admit: 2023-08-17 | Discharge: 2023-08-17 | Disposition: A | Discharge status: Home / Self Care | Source: Ambulatory Visit | Attending: Neurosurgery | Admitting: Neurosurgery

## 2023-08-17 DIAGNOSIS — G8929 Other chronic pain: Secondary | ICD-10-CM

## 2023-08-17 DIAGNOSIS — M6259 Muscle wasting and atrophy, not elsewhere classified, multiple sites: Secondary | ICD-10-CM | POA: Diagnosis not present

## 2023-08-17 DIAGNOSIS — M4150 Other secondary scoliosis, site unspecified: Secondary | ICD-10-CM

## 2023-08-17 DIAGNOSIS — M5451 Vertebrogenic low back pain: Secondary | ICD-10-CM | POA: Diagnosis not present

## 2023-08-17 DIAGNOSIS — M546 Pain in thoracic spine: Secondary | ICD-10-CM | POA: Diagnosis not present

## 2023-08-17 DIAGNOSIS — M47816 Spondylosis without myelopathy or radiculopathy, lumbar region: Secondary | ICD-10-CM | POA: Diagnosis not present

## 2023-08-17 DIAGNOSIS — M48061 Spinal stenosis, lumbar region without neurogenic claudication: Secondary | ICD-10-CM | POA: Diagnosis not present

## 2023-08-17 DIAGNOSIS — R293 Abnormal posture: Secondary | ICD-10-CM | POA: Diagnosis not present

## 2023-08-22 DIAGNOSIS — M5451 Vertebrogenic low back pain: Secondary | ICD-10-CM | POA: Diagnosis not present

## 2023-08-22 DIAGNOSIS — M6259 Muscle wasting and atrophy, not elsewhere classified, multiple sites: Secondary | ICD-10-CM | POA: Diagnosis not present

## 2023-08-22 DIAGNOSIS — R293 Abnormal posture: Secondary | ICD-10-CM | POA: Diagnosis not present

## 2023-08-23 ENCOUNTER — Encounter: Payer: Self-pay | Admitting: Family Medicine

## 2023-08-23 ENCOUNTER — Ambulatory Visit
Admission: RE | Admit: 2023-08-23 | Discharge: 2023-08-23 | Discharge status: Home / Self Care | Source: Ambulatory Visit | Attending: Family Medicine | Admitting: Family Medicine

## 2023-08-23 DIAGNOSIS — M8580 Other specified disorders of bone density and structure, unspecified site: Secondary | ICD-10-CM

## 2023-08-23 DIAGNOSIS — E2839 Other primary ovarian failure: Secondary | ICD-10-CM | POA: Diagnosis not present

## 2023-08-24 DIAGNOSIS — R293 Abnormal posture: Secondary | ICD-10-CM | POA: Diagnosis not present

## 2023-08-24 DIAGNOSIS — M6259 Muscle wasting and atrophy, not elsewhere classified, multiple sites: Secondary | ICD-10-CM | POA: Diagnosis not present

## 2023-08-24 DIAGNOSIS — M5451 Vertebrogenic low back pain: Secondary | ICD-10-CM | POA: Diagnosis not present

## 2023-08-24 NOTE — Telephone Encounter (Signed)
 Copied from CRM [id number]. Topic: Clinical - Lab/Test Results >> Aug 24, 2023 12:27 PM Dewanda Foots wrote: Reason for CRM: Mrs. Salvati had a bone density test done recently and would like the results of this test as well as her MRI and 2 CT Scans (Done in May) sent over to her Dr. At [facility] (Dr. Jayne Mews).  Fax number is [phone] please.

## 2023-08-24 NOTE — Telephone Encounter (Signed)
 Results faxed to Dr number provided

## 2023-08-24 NOTE — Telephone Encounter (Signed)
 Please see her comments below (not the telephone note), she is going out of town next week

## 2023-08-29 ENCOUNTER — Ambulatory Visit: Payer: Self-pay | Admitting: Family Medicine

## 2023-08-29 ENCOUNTER — Other Ambulatory Visit: Payer: Self-pay | Admitting: Family Medicine

## 2023-08-29 DIAGNOSIS — M810 Age-related osteoporosis without current pathological fracture: Secondary | ICD-10-CM

## 2023-08-29 NOTE — Progress Notes (Signed)
 Please let her know:  "Your bone density study shows that you have osteoporosis.  Your fracture risk is considered "high".  I would like to refer you to the osteoporosis clinic for further evaluation and treatment.  Kevon Pellegrini Persons in a PA at [facility] and does a great job. They will call you to schedule a visit".

## 2023-08-31 ENCOUNTER — Ambulatory Visit: Payer: Self-pay

## 2023-08-31 NOTE — Telephone Encounter (Signed)
  Chief Complaint: Finger Pain Symptoms: swelling and pain to right middle finger Frequency: last two days Pertinent Negatives: Patient denies fever Disposition: [] ED /[x] Urgent Care (no appt availability in office) / [] Appointment(In office/virtual)/ []  [facility]/ [] Home Care/ [] Refused Recommended Disposition /[]  Mobile Bus/ []  Follow-up with PCP Additional Notes: patient calling with concerns for right middle finger pain and swelling. Patient states she has been out of town and noticed in the last two days some swelling to her right middle finger. Patient endorses today she observed the swelling to her right middle finger had worsened and she was having discomfort when she bent her finger. Patient endorses no pain currently. Per protocol, patient is recommended to be seen within three days. No availability in office until next week. Patient recommended to be evaluated in Urgent Care. Patient verbalized understanding and all questions answered.    Copied from CRM [id number]. Topic: Clinical - Red Word Triage >> Aug 31, 2023  3:57 PM Kita Perish H wrote: [facility] that prompted transfer to Nurse Triage: Middle finger on right hand swollen, not really any pain just when she bends it, it feels tight Reason for Disposition  [1] MODERATE pain (e.g., interferes with normal activities) AND [2] present > 3 days  Answer Assessment - Initial Assessment Questions 1. ONSET: "When did the pain start?"      Patient noticed it the last two days 2. LOCATION and RADIATION: "Where is the pain located?"  (e.g., fingertip, around nail, joint, entire  finger)      Right middle finger 3. SEVERITY: "How bad is the pain?" "What does it keep you from doing?"   (Scale 1-10; or mild, moderate, severe)  - MILD (1-3): doesn't interfere with normal activities.   - MODERATE (4-7): interferes with normal activities or awakens from sleep.  - SEVERE (8-10): excruciating pain, unable to hold a glass of  water or bend finger even a little.     No pain currently 4. APPEARANCE: "What does the finger look like?" (e.g., redness, swelling, bruising, pallor)     swelling 5. WORK OR EXERCISE: "Has there been any recent work or exercise that involved this part (i.e., fingers or hand) of the body?"     no 6. CAUSE: "What do you think is causing the pain?"     unsure 7. AGGRAVATING FACTORS: "What makes the pain worse?" (e.g., using computer)     Bending finger 8. OTHER SYMPTOMS: "Do you have any other symptoms?" (e.g., fever, neck pain, numbness)     no  Protocols used: Finger Pain-A-AH

## 2023-09-01 DIAGNOSIS — R293 Abnormal posture: Secondary | ICD-10-CM | POA: Diagnosis not present

## 2023-09-01 DIAGNOSIS — M5451 Vertebrogenic low back pain: Secondary | ICD-10-CM | POA: Diagnosis not present

## 2023-09-01 DIAGNOSIS — M6259 Muscle wasting and atrophy, not elsewhere classified, multiple sites: Secondary | ICD-10-CM | POA: Diagnosis not present

## 2023-09-05 DIAGNOSIS — M5451 Vertebrogenic low back pain: Secondary | ICD-10-CM | POA: Diagnosis not present

## 2023-09-05 DIAGNOSIS — M6259 Muscle wasting and atrophy, not elsewhere classified, multiple sites: Secondary | ICD-10-CM | POA: Diagnosis not present

## 2023-09-05 DIAGNOSIS — R293 Abnormal posture: Secondary | ICD-10-CM | POA: Diagnosis not present

## 2023-09-06 ENCOUNTER — Telehealth: Payer: Self-pay

## 2023-09-06 NOTE — Telephone Encounter (Signed)
 This patient is appearing on a report for being at risk of failing the adherence measure for cholesterol (statin) medications this calendar year.   Medication: rosuvastatin  5 mg Last fill date: 08/19/23 for 90 day supply  Insurance report was not up to date. No action needed at this time.   Abelina Abide, PharmD PGY1 Pharmacy Resident 09/06/2023 10:19 AM

## 2023-09-11 DIAGNOSIS — G8929 Other chronic pain: Secondary | ICD-10-CM | POA: Diagnosis not present

## 2023-09-11 DIAGNOSIS — M4150 Other secondary scoliosis, site unspecified: Secondary | ICD-10-CM | POA: Diagnosis not present

## 2023-09-11 DIAGNOSIS — M546 Pain in thoracic spine: Secondary | ICD-10-CM | POA: Diagnosis not present

## 2023-09-12 DIAGNOSIS — R293 Abnormal posture: Secondary | ICD-10-CM | POA: Diagnosis not present

## 2023-09-12 DIAGNOSIS — M6259 Muscle wasting and atrophy, not elsewhere classified, multiple sites: Secondary | ICD-10-CM | POA: Diagnosis not present

## 2023-09-12 DIAGNOSIS — M5451 Vertebrogenic low back pain: Secondary | ICD-10-CM | POA: Diagnosis not present

## 2023-09-15 DIAGNOSIS — M5451 Vertebrogenic low back pain: Secondary | ICD-10-CM | POA: Diagnosis not present

## 2023-09-15 DIAGNOSIS — R293 Abnormal posture: Secondary | ICD-10-CM | POA: Diagnosis not present

## 2023-09-15 DIAGNOSIS — M6259 Muscle wasting and atrophy, not elsewhere classified, multiple sites: Secondary | ICD-10-CM | POA: Diagnosis not present

## 2023-09-18 DIAGNOSIS — F331 Major depressive disorder, recurrent, moderate: Secondary | ICD-10-CM | POA: Diagnosis not present

## 2023-09-18 DIAGNOSIS — F411 Generalized anxiety disorder: Secondary | ICD-10-CM | POA: Diagnosis not present

## 2023-09-18 DIAGNOSIS — F1021 Alcohol dependence, in remission: Secondary | ICD-10-CM | POA: Diagnosis not present

## 2023-09-19 DIAGNOSIS — R293 Abnormal posture: Secondary | ICD-10-CM | POA: Diagnosis not present

## 2023-09-19 DIAGNOSIS — M5451 Vertebrogenic low back pain: Secondary | ICD-10-CM | POA: Diagnosis not present

## 2023-09-19 DIAGNOSIS — M6259 Muscle wasting and atrophy, not elsewhere classified, multiple sites: Secondary | ICD-10-CM | POA: Diagnosis not present

## 2023-09-21 DIAGNOSIS — M6259 Muscle wasting and atrophy, not elsewhere classified, multiple sites: Secondary | ICD-10-CM | POA: Diagnosis not present

## 2023-09-21 DIAGNOSIS — M5451 Vertebrogenic low back pain: Secondary | ICD-10-CM | POA: Diagnosis not present

## 2023-09-21 DIAGNOSIS — R293 Abnormal posture: Secondary | ICD-10-CM | POA: Diagnosis not present

## 2023-09-26 DIAGNOSIS — M4150 Other secondary scoliosis, site unspecified: Secondary | ICD-10-CM | POA: Diagnosis not present

## 2023-09-26 DIAGNOSIS — M546 Pain in thoracic spine: Secondary | ICD-10-CM | POA: Diagnosis not present

## 2023-09-26 DIAGNOSIS — G8929 Other chronic pain: Secondary | ICD-10-CM | POA: Diagnosis not present

## 2023-10-02 ENCOUNTER — Other Ambulatory Visit: Payer: Self-pay | Admitting: Family Medicine

## 2023-10-02 DIAGNOSIS — Z1231 Encounter for screening mammogram for malignant neoplasm of breast: Secondary | ICD-10-CM

## 2023-10-03 DIAGNOSIS — R293 Abnormal posture: Secondary | ICD-10-CM | POA: Diagnosis not present

## 2023-10-03 DIAGNOSIS — M6259 Muscle wasting and atrophy, not elsewhere classified, multiple sites: Secondary | ICD-10-CM | POA: Diagnosis not present

## 2023-10-03 DIAGNOSIS — M5451 Vertebrogenic low back pain: Secondary | ICD-10-CM | POA: Diagnosis not present

## 2023-10-10 DIAGNOSIS — M6259 Muscle wasting and atrophy, not elsewhere classified, multiple sites: Secondary | ICD-10-CM | POA: Diagnosis not present

## 2023-10-10 DIAGNOSIS — M5451 Vertebrogenic low back pain: Secondary | ICD-10-CM | POA: Diagnosis not present

## 2023-10-10 DIAGNOSIS — R293 Abnormal posture: Secondary | ICD-10-CM | POA: Diagnosis not present

## 2023-10-12 DIAGNOSIS — F331 Major depressive disorder, recurrent, moderate: Secondary | ICD-10-CM | POA: Diagnosis not present

## 2023-10-12 DIAGNOSIS — M6259 Muscle wasting and atrophy, not elsewhere classified, multiple sites: Secondary | ICD-10-CM | POA: Diagnosis not present

## 2023-10-12 DIAGNOSIS — R293 Abnormal posture: Secondary | ICD-10-CM | POA: Diagnosis not present

## 2023-10-12 DIAGNOSIS — F411 Generalized anxiety disorder: Secondary | ICD-10-CM | POA: Diagnosis not present

## 2023-10-12 DIAGNOSIS — M5451 Vertebrogenic low back pain: Secondary | ICD-10-CM | POA: Diagnosis not present

## 2023-10-17 DIAGNOSIS — M6259 Muscle wasting and atrophy, not elsewhere classified, multiple sites: Secondary | ICD-10-CM | POA: Diagnosis not present

## 2023-10-17 DIAGNOSIS — M5451 Vertebrogenic low back pain: Secondary | ICD-10-CM | POA: Diagnosis not present

## 2023-10-17 DIAGNOSIS — R293 Abnormal posture: Secondary | ICD-10-CM | POA: Diagnosis not present

## 2023-10-30 DIAGNOSIS — D2261 Melanocytic nevi of right upper limb, including shoulder: Secondary | ICD-10-CM | POA: Diagnosis not present

## 2023-10-30 DIAGNOSIS — D225 Melanocytic nevi of trunk: Secondary | ICD-10-CM | POA: Diagnosis not present

## 2023-10-30 DIAGNOSIS — M5451 Vertebrogenic low back pain: Secondary | ICD-10-CM | POA: Diagnosis not present

## 2023-10-30 DIAGNOSIS — L570 Actinic keratosis: Secondary | ICD-10-CM | POA: Diagnosis not present

## 2023-10-30 DIAGNOSIS — R293 Abnormal posture: Secondary | ICD-10-CM | POA: Diagnosis not present

## 2023-10-30 DIAGNOSIS — L812 Freckles: Secondary | ICD-10-CM | POA: Diagnosis not present

## 2023-10-30 DIAGNOSIS — L720 Epidermal cyst: Secondary | ICD-10-CM | POA: Diagnosis not present

## 2023-10-30 DIAGNOSIS — L821 Other seborrheic keratosis: Secondary | ICD-10-CM | POA: Diagnosis not present

## 2023-10-30 DIAGNOSIS — F411 Generalized anxiety disorder: Secondary | ICD-10-CM | POA: Diagnosis not present

## 2023-10-30 DIAGNOSIS — M6259 Muscle wasting and atrophy, not elsewhere classified, multiple sites: Secondary | ICD-10-CM | POA: Diagnosis not present

## 2023-10-30 DIAGNOSIS — Z85828 Personal history of other malignant neoplasm of skin: Secondary | ICD-10-CM | POA: Diagnosis not present

## 2023-10-30 DIAGNOSIS — F331 Major depressive disorder, recurrent, moderate: Secondary | ICD-10-CM | POA: Diagnosis not present

## 2023-11-01 DIAGNOSIS — R293 Abnormal posture: Secondary | ICD-10-CM | POA: Diagnosis not present

## 2023-11-01 DIAGNOSIS — M5451 Vertebrogenic low back pain: Secondary | ICD-10-CM | POA: Diagnosis not present

## 2023-11-01 DIAGNOSIS — M6259 Muscle wasting and atrophy, not elsewhere classified, multiple sites: Secondary | ICD-10-CM | POA: Diagnosis not present

## 2023-11-06 ENCOUNTER — Ambulatory Visit
Admission: RE | Admit: 2023-11-06 | Discharge: 2023-11-06 | Disposition: A | Discharge status: Home / Self Care | Source: Ambulatory Visit | Attending: Family Medicine | Admitting: Family Medicine

## 2023-11-06 DIAGNOSIS — Z1231 Encounter for screening mammogram for malignant neoplasm of breast: Secondary | ICD-10-CM

## 2023-11-13 ENCOUNTER — Other Ambulatory Visit (INDEPENDENT_AMBULATORY_CARE_PROVIDER_SITE_OTHER)

## 2023-11-13 DIAGNOSIS — E7800 Pure hypercholesterolemia, unspecified: Secondary | ICD-10-CM

## 2023-11-13 LAB — LIPID PANEL
Cholesterol: 194 mg/dL (ref 0–200)
HDL: 62.3 mg/dL (ref >39.00)
LDL Cholesterol: 123 mg/dL — ABNORMAL HIGH (ref 0–99)
NonHDL: 132.13
Total CHOL/HDL Ratio: 3
Triglycerides: 46 mg/dL (ref 0.0–149.0)
VLDL: 9.2 mg/dL (ref 0.0–40.0)

## 2023-11-13 LAB — ALT: ALT: 13 U/L (ref 0–35)

## 2023-11-14 ENCOUNTER — Ambulatory Visit: Payer: Self-pay | Admitting: Family Medicine

## 2023-11-14 DIAGNOSIS — F331 Major depressive disorder, recurrent, moderate: Secondary | ICD-10-CM | POA: Diagnosis not present

## 2023-11-14 DIAGNOSIS — F411 Generalized anxiety disorder: Secondary | ICD-10-CM | POA: Diagnosis not present

## 2023-11-14 NOTE — Progress Notes (Signed)
 Please let me know if she is ok to increase her Crestor  dose since her LDL has not improved.

## 2023-11-17 ENCOUNTER — Ambulatory Visit: Admitting: Family Medicine

## 2023-11-21 DIAGNOSIS — F331 Major depressive disorder, recurrent, moderate: Secondary | ICD-10-CM | POA: Diagnosis not present

## 2023-11-21 DIAGNOSIS — F411 Generalized anxiety disorder: Secondary | ICD-10-CM | POA: Diagnosis not present

## 2023-11-24 ENCOUNTER — Encounter: Payer: Self-pay | Admitting: Family Medicine

## 2023-11-24 ENCOUNTER — Ambulatory Visit: Admitting: Family Medicine

## 2023-11-24 VITALS — BP 122/76 | HR 60 | Temp 97.8°F | Ht 63.0 in | Wt 122.0 lb

## 2023-11-24 DIAGNOSIS — E7800 Pure hypercholesterolemia, unspecified: Secondary | ICD-10-CM | POA: Diagnosis not present

## 2023-11-24 DIAGNOSIS — R252 Cramp and spasm: Secondary | ICD-10-CM

## 2023-11-24 DIAGNOSIS — K219 Gastro-esophageal reflux disease without esophagitis: Secondary | ICD-10-CM | POA: Diagnosis not present

## 2023-11-24 DIAGNOSIS — R053 Chronic cough: Secondary | ICD-10-CM | POA: Insufficient documentation

## 2023-11-24 DIAGNOSIS — Z9189 Other specified personal risk factors, not elsewhere classified: Secondary | ICD-10-CM

## 2023-11-24 DIAGNOSIS — R202 Paresthesia of skin: Secondary | ICD-10-CM | POA: Diagnosis not present

## 2023-11-24 DIAGNOSIS — I700 Atherosclerosis of aorta: Secondary | ICD-10-CM

## 2023-11-24 DIAGNOSIS — M810 Age-related osteoporosis without current pathological fracture: Secondary | ICD-10-CM | POA: Diagnosis not present

## 2023-11-24 DIAGNOSIS — E559 Vitamin D deficiency, unspecified: Secondary | ICD-10-CM | POA: Diagnosis not present

## 2023-11-24 DIAGNOSIS — H9313 Tinnitus, bilateral: Secondary | ICD-10-CM | POA: Diagnosis not present

## 2023-11-24 DIAGNOSIS — E2839 Other primary ovarian failure: Secondary | ICD-10-CM

## 2023-11-24 LAB — CBC WITH DIFFERENTIAL/PLATELET
Basophils Absolute: 0.1 K/uL (ref 0.0–0.1)
Basophils Relative: 1.2 % (ref 0.0–3.0)
Eosinophils Absolute: 0.2 K/uL (ref 0.0–0.7)
Eosinophils Relative: 3 % (ref 0.0–5.0)
HCT: 36.5 % (ref 36.0–46.0)
Hemoglobin: 12.2 g/dL (ref 12.0–15.0)
Lymphocytes Relative: 28.6 % (ref 12.0–46.0)
Lymphs Abs: 1.7 K/uL (ref 0.7–4.0)
MCHC: 33.3 g/dL (ref 30.0–36.0)
MCV: 85.5 fl (ref 78.0–100.0)
Monocytes Absolute: 0.5 K/uL (ref 0.1–1.0)
Monocytes Relative: 7.8 % (ref 3.0–12.0)
Neutro Abs: 3.6 K/uL (ref 1.4–7.7)
Neutrophils Relative %: 59.4 % (ref 43.0–77.0)
Platelets: 259 K/uL (ref 150.0–400.0)
RBC: 4.27 Mil/uL (ref 3.87–5.11)
RDW: 13 % (ref 11.5–15.5)
WBC: 6.1 K/uL (ref 4.0–10.5)

## 2023-11-24 LAB — COMPREHENSIVE METABOLIC PANEL WITH GFR
ALT: 10 U/L (ref 0–35)
AST: 15 U/L (ref 0–37)
Albumin: 4.1 g/dL (ref 3.5–5.2)
Alkaline Phosphatase: 50 U/L (ref 39–117)
BUN: 13 mg/dL (ref 6–23)
CO2: 31 meq/L (ref 19–32)
Calcium: 9 mg/dL (ref 8.4–10.5)
Chloride: 103 meq/L (ref 96–112)
Creatinine, Ser: 0.72 mg/dL (ref 0.40–1.20)
GFR: 84.9 mL/min (ref >60.00)
Glucose, Bld: 89 mg/dL (ref 70–99)
Potassium: 4 meq/L (ref 3.5–5.1)
Sodium: 139 meq/L (ref 135–145)
Total Bilirubin: 0.3 mg/dL (ref 0.2–1.2)
Total Protein: 6.5 g/dL (ref 6.0–8.3)

## 2023-11-24 LAB — LIPID PANEL
Cholesterol: 163 mg/dL (ref 0–200)
HDL: 62.6 mg/dL (ref >39.00)
LDL Cholesterol: 91 mg/dL (ref 0–99)
NonHDL: 100.83
Total CHOL/HDL Ratio: 3
Triglycerides: 49 mg/dL (ref 0.0–149.0)
VLDL: 9.8 mg/dL (ref 0.0–40.0)

## 2023-11-24 LAB — MAGNESIUM: Magnesium: 2.1 mg/dL (ref 1.5–2.5)

## 2023-11-24 LAB — FERRITIN: Ferritin: 39.8 ng/mL (ref 10.0–291.0)

## 2023-11-24 LAB — VITAMIN B12: Vitamin B-12: 372 pg/mL (ref 211–911)

## 2023-11-24 LAB — CK: Total CK: 108 U/L (ref 17–177)

## 2023-11-24 LAB — TSH: TSH: 1.52 u[IU]/mL (ref 0.35–5.50)

## 2023-11-24 LAB — VITAMIN D 25 HYDROXY (VIT D DEFICIENCY, FRACTURES): VITD: 46.48 ng/mL (ref 30.00–100.00)

## 2023-11-24 LAB — T4, FREE: Free T4: 0.81 ng/dL (ref 0.60–1.60)

## 2023-11-24 LAB — FOLATE: Folate: 15.6 ng/mL (ref >5.9)

## 2023-11-24 MED ORDER — OMEPRAZOLE 20 MG PO CPDR: 20.0000 mg | DELAYED_RELEASE_CAPSULE | Freq: Every day | ORAL | 3 refills | Status: DC

## 2023-11-24 NOTE — Progress Notes (Addendum)
 Subjective:     Patient ID: Nancy Smith, female    DOB: 1956-01-05, 68 y.o.   MRN: [id number]  Chief Complaint  Patient presents with   Follow-up    Discuss increasing Crestor     HPI  Discussed the use of AI scribe software for clinical note transcription with the patient, who gave verbal consent to proceed.  History of Present Illness Nancy Smith is a 68 year old female with hyperlipidemia and scoliosis who presents with leg stiffness and tingling.  Lower extremity neuromuscular symptoms - Stiffness and tingling in both legs, primarily from the knees down - Symptoms more pronounced in one leg but present bilaterally - Movement sometimes alleviates symptoms - Able to walk over two miles without discomfort - Severe nocturnal cramping in both legs, unprecedented, relieved by massaging legs and ambulation - No chest pain, palpitations, or shortness of breath  Chronic back pain secondary to scoliosis - History of scoliosis with significant pain in the past - Completed over 40 sessions of physical therapy - Exercises and dry needling provide symptomatic relief - No medication for scoliosis pain since July, except for occasional use of muscle relaxers during severe pain episodes  Medication intolerance and current pharmacotherapy - Discontinued statins due to muscle pain and sleep disturbances - Currently takes Cymbalta and trazodone  for sleep  Gastroesophageal reflux symptoms - History of acid reflux - Previously treated with omeprazole    She sees psychiatrist and counselor   Osteoporosis- is not currently on medication.    Health Maintenance Due  Topic Date Due   Pneumococcal Vaccine: 50+ Years (2 of 2 - PPSV23, PCV20, or PCV21) 04/19/2019   DTaP/Tdap/Td (2 - Tdap) 10/20/2020   Medicare Annual Wellness (AWV)  04/20/2023   INFLUENZA VACCINE  11/10/2023    Past Medical History:  Diagnosis Date   Bronchitis    Colitis    Lymphocytic colitis; had 2 flares in  her lifetime   Hx of appendicitis    Hyperlipidemia     Past Surgical History:  Procedure Laterality Date   APPENDECTOMY     EYE SURGERY  1996   HERNIA REPAIR  1983   Femoral   MOHS SURGERY     for Melanoma   TONSILLECTOMY  1960    Family History  Problem Relation Age of Onset   Heart failure Mother    Lung cancer Mother        Smoker   CAD Mother        s/p bypass   Osteoporosis Mother    Heart failure Father    Hyperlipidemia Father    Heart disease Father    Atrial fibrillation Father    Congestive Heart Failure Brother    Liver disease Neg Hx    Esophageal cancer Neg Hx    Colon cancer Neg Hx    Breast cancer Neg Hx    BRCA 1/2 Neg Hx     Social History   Socioeconomic History   Marital status: Married    Spouse name: Not on file   Number of children: 0   Years of education: Not on file   Highest education level: Master's degree (e.g., MA, MS, MEng, MEd, MSW, MBA)  Occupational History   Occupation: retired  Tobacco Use   Smoking status: Former    Current packs/day: 0.00    Average packs/day: 0.3 packs/day for 15.0 years (3.8 ttl pk-yrs)    Types: Cigarettes    Start date: 6    Quit date: 1994  Years since quitting: 31.6   Smokeless tobacco: Never  Vaping Use   Vaping status: Never Used  Substance and Sexual Activity   Alcohol use: Not Currently   Drug use: Never   Sexual activity: Not Currently    Comment: More than 5 parnters, Younger than 16 IC, No STD, No abd pap, no des exposure, no breast cancer meds  Other Topics Concern   Not on file  Social History Narrative   Not on file   Social Drivers of Health   Financial Resource Strain: Low Risk  (04/19/2022)   Overall Financial Resource Strain (CARDIA)    Difficulty of Paying Living Expenses: Not hard at all  Food Insecurity: No Food Insecurity (04/19/2022)   Hunger Vital Sign    Worried About Running Out of Food in the Last Year: Never true    Ran Out of Food in the Last Year: Never  true  Transportation Needs: No Transportation Needs (04/19/2022)   PRAPARE - Administrator, Civil Service (Medical): No    Lack of Transportation (Non-Medical): No  Physical Activity: Sufficiently Active (04/19/2022)   Exercise Vital Sign    Days of Exercise per Week: 7 days    Minutes of Exercise per Session: 50 min  Stress: No Stress Concern Present (04/19/2022)   [employer] of Occupational Health - Occupational Stress Questionnaire    Feeling of Stress : Not at all  Social Connections: Socially Integrated (04/19/2022)   Social Connection and Isolation Panel    Frequency of Communication with Friends and Family: More than three times a week    Frequency of Social Gatherings with Friends and Family: Once a week    Attends Religious Services: 1 to 4 times per year    Active Member of [employer] or Organizations: Yes    Attends Banker Meetings: 1 to 4 times per year    Marital Status: Married  Catering manager Violence: Not At Risk (04/19/2022)   Humiliation, Afraid, Rape, and Kick questionnaire    Fear of Current or Ex-Partner: No    Emotionally Abused: No    Physically Abused: No    Sexually Abused: No    Outpatient Medications Prior to Visit  Medication Sig Dispense Refill   cetirizine  (ZYRTEC  ALLERGY) 10 MG tablet Take 1 tablet (10 mg total) by mouth daily. 90 tablet 3   traZODone  (DESYREL ) 100 MG tablet Take 100 mg by mouth at bedtime.     buPROPion  (WELLBUTRIN  XL) 300 MG 24 hr tablet Take 300 mg by mouth daily.     rosuvastatin  (CRESTOR ) 5 MG tablet Take 1 tablet (5 mg total) by mouth daily. 90 tablet 1   No facility-administered medications prior to visit.    No Known Allergies  ROS Per HPI    Objective:    Physical Exam Constitutional:      General: She is not in acute distress.    Appearance: She is not ill-appearing.  HENT:     Right Ear: Tympanic membrane and ear canal normal.     Left Ear: Tympanic membrane and ear canal normal.      Nose: Nose normal.     Mouth/Throat:     Mouth: Mucous membranes are moist.     Pharynx: Oropharynx is clear.  Eyes:     Extraocular Movements: Extraocular movements intact.     Conjunctiva/sclera: Conjunctivae normal.     Pupils: Pupils are equal, round, and reactive to light.  Cardiovascular:     Rate  and Rhythm: Normal rate and regular rhythm.  Pulmonary:     Effort: Pulmonary effort is normal.     Breath sounds: Normal breath sounds.  Abdominal:     General: There is no distension.     Palpations: Abdomen is soft.     Tenderness: There is no abdominal tenderness. There is no guarding or rebound.  Musculoskeletal:        General: Normal range of motion.     Cervical back: Normal range of motion and neck supple. No tenderness.     Right lower leg: No edema.     Left lower leg: No edema.  Lymphadenopathy:     Cervical: No cervical adenopathy.  Skin:    General: Skin is warm and dry.  Neurological:     General: No focal deficit present.     Mental Status: She is alert and oriented to person, place, and time.     Cranial Nerves: No cranial nerve deficit.     Sensory: No sensory deficit.     Motor: No weakness.     Coordination: Coordination normal.     Gait: Gait normal.  Psychiatric:        Mood and Affect: Mood normal.        Behavior: Behavior normal.        Thought Content: Thought content normal.      BP 122/76   Pulse 60   Temp 97.8 F (36.6 C) (Temporal)   Ht 5' 3 (1.6 m)   Wt 122 lb (55.3 kg)   SpO2 98%   BMI 21.61 kg/m  Wt Readings from Last 3 Encounters:  11/24/23 122 lb (55.3 kg)  04/27/23 121 lb (54.9 kg)  04/26/23 121 lb 6.4 oz (55.1 kg)       Assessment & Plan:   Problem List Items Addressed This Visit     Gastroesophageal reflux disease (Chronic)   Relevant Medications   omeprazole  (PRILOSEC) 20 MG capsule   Aortic atherosclerosis (HCC)   Relevant Orders   Lipid panel (Completed)   CT CARDIAC SCORING (SELF PAY ONLY)   EKG 12-Lead    Chronic cough   Hyperlipidemia - Primary   Relevant Orders   Lipid panel (Completed)   CT CARDIAC SCORING (SELF PAY ONLY)   EKG 12-Lead   Osteoporosis without current pathological fracture   Tinnitus of both ears   Relevant Orders   Ambulatory referral to ENT   Vitamin D  deficiency   Relevant Orders   VITAMIN D  25 Hydroxy (Vit-D Deficiency, Fractures) (Completed)   Other Visit Diagnoses       Leg cramps       Relevant Orders   CBC with Differential/Platelet (Completed)   Comprehensive metabolic panel with GFR (Completed)   Ferritin (Completed)   Vitamin B12 (Completed)   Zinc    Vitamin B1   Magnesium (Completed)   CK (Creatine Kinase) (Completed)     Paresthesias       Relevant Orders   CBC with Differential/Platelet (Completed)   Comprehensive metabolic panel with GFR (Completed)   Ferritin (Completed)   Folate (Completed)   TSH (Completed)   T4, free (Completed)   Vitamin B12 (Completed)   Zinc    Vitamin B1   Magnesium (Completed)   CK (Creatine Kinase) (Completed)     Estrogen deficiency         Risk of myocardial infarction or stroke 7.5% or greater in next 10 years       Relevant Orders  CT CARDIAC SCORING (SELF PAY ONLY)   EKG 12-Lead      Assessment and Plan Assessment & Plan Hyperlipidemia Hyperlipidemia with ASCVD risk of 8.8%. Previous statin use resulted in myalgia and sleep disturbances, leading to discontinuation. Current cholesterol levels are not severely elevated, but efforts to reduce them to normal range are ongoing. - Order CT coronary calcium  score - Update labs to check cholesterol levels  Scoliosis Chronic scoliosis pain managed with physical therapy and dry needling. Significant improvement with exercises and non-pharmacological interventions. Occasional use of muscle relaxants for severe pain.  Leg pain, stiffness, and cramping Intermittent leg pain, stiffness, and cramping, primarily from knees down. Symptoms include tingling and  weakness, with episodes of severe cramping. Symptoms not correlated with statin use. Differential includes vitamin deficiency or other metabolic issues. - Order blood work to check magnesium, B12, and CK levels - Perform EKG  Tinnitus Intermittent bilateral tinnitus, possibly related to TMJ or age-related hearing loss. Symptoms worsened during a recent trip to the mountains. Normal bilateral TM exam  - Refer to ENT for further evaluation  Chronic cough Chronic cough previously diagnosed as acid reflux-related. - Refill omeprazole  prescription  Gastroesophageal reflux disease (GERD) GERD with associated chronic cough. - Refill omeprazole  prescription   ASCVD ASCVD risk of 8.8%, not significantly higher than target range. No current symptoms suggestive of acute coronary syndrome.sinus bradycardia, rate 50, unchanged from previous EKG  -EKG shows  - Order CT coronary calcium  score  Vitamin d  deficiency -recommend taking vitamin D  supplement.  -check vitamin d  level   Paresthesias Intermittent tingling in legs, possibly related to vitamin deficiency or other metabolic issues. - Order blood work to check magnesium, B12, and CK levels  Osteoporosis -has not been on treatment.  -discussed oral bisphosphonate once I have her lab results.     I have discontinued Nancy Smith's buPROPion  and rosuvastatin . I am also having her start on omeprazole . Additionally, I am having her maintain her cetirizine  and traZODone .  Meds ordered this encounter  Medications   omeprazole  (PRILOSEC) 20 MG capsule    Sig: Take 1 capsule (20 mg total) by mouth daily.    Dispense:  30 capsule    Refill:  3    Supervising Provider:   ROLLENE NORRIS A [[id number]]

## 2023-11-24 NOTE — Patient Instructions (Signed)
 Please go downstairs for labs.   You will receive calls from ENT and for the CT coronary calcium  scan.   I will be in touch with your labs and regarding medication for osteoporosis.

## 2023-11-26 ENCOUNTER — Ambulatory Visit: Payer: Self-pay | Admitting: Family Medicine

## 2023-11-26 DIAGNOSIS — H9313 Tinnitus, bilateral: Secondary | ICD-10-CM

## 2023-11-26 DIAGNOSIS — H9190 Unspecified hearing loss, unspecified ear: Secondary | ICD-10-CM

## 2023-11-27 ENCOUNTER — Other Ambulatory Visit: Payer: Self-pay | Admitting: Family Medicine

## 2023-11-27 ENCOUNTER — Encounter: Payer: Self-pay | Admitting: Family Medicine

## 2023-11-27 DIAGNOSIS — R252 Cramp and spasm: Secondary | ICD-10-CM

## 2023-11-27 DIAGNOSIS — R200 Anesthesia of skin: Secondary | ICD-10-CM

## 2023-11-30 LAB — ZINC

## 2023-11-30 LAB — EXTRA SPECIMEN

## 2023-11-30 LAB — VITAMIN B1: Vitamin B1 (Thiamine): 13 nmol/L (ref 8–30)

## 2023-12-01 ENCOUNTER — Other Ambulatory Visit: Payer: Self-pay | Admitting: Family Medicine

## 2023-12-01 MED ORDER — ALENDRONATE SODIUM 70 MG PO TABS: 70.0000 mg | ORAL_TABLET | ORAL | 11 refills | Status: AC

## 2023-12-01 NOTE — Progress Notes (Signed)
 Her thiamine (B1) level was normal.  Please ask her if she is ready to start treatment for osteoporosis.  We talked about this briefly at her last visit.  She has the option of weekly alendronate  (Fosamax ) or we could see if insurance will cover monthly ibandronate (Boniva).  Both of these medications are oral bisphosphonates.

## 2023-12-04 ENCOUNTER — Ambulatory Visit (HOSPITAL_COMMUNITY)
Admission: RE | Admit: 2023-12-04 | Discharge: 2023-12-04 | Disposition: A | Discharge status: Home / Self Care | Source: Ambulatory Visit | Attending: Family Medicine | Admitting: Family Medicine

## 2023-12-04 DIAGNOSIS — R202 Paresthesia of skin: Secondary | ICD-10-CM | POA: Diagnosis not present

## 2023-12-04 DIAGNOSIS — R200 Anesthesia of skin: Secondary | ICD-10-CM | POA: Diagnosis not present

## 2023-12-04 DIAGNOSIS — R252 Cramp and spasm: Secondary | ICD-10-CM | POA: Diagnosis not present

## 2023-12-04 LAB — VAS US ABI WITH/WO TBI
Left ABI: 1.18
Right ABI: 1.17

## 2023-12-05 ENCOUNTER — Ambulatory Visit (HOSPITAL_COMMUNITY)
Admission: RE | Admit: 2023-12-05 | Discharge: 2023-12-05 | Disposition: A | Discharge status: Home / Self Care | Payer: Self-pay | Source: Ambulatory Visit | Attending: Family Medicine | Admitting: Family Medicine

## 2023-12-05 ENCOUNTER — Ambulatory Visit: Payer: Self-pay | Admitting: Family Medicine

## 2023-12-05 DIAGNOSIS — F411 Generalized anxiety disorder: Secondary | ICD-10-CM | POA: Diagnosis not present

## 2023-12-05 DIAGNOSIS — E7800 Pure hypercholesterolemia, unspecified: Secondary | ICD-10-CM | POA: Insufficient documentation

## 2023-12-05 DIAGNOSIS — Z9189 Other specified personal risk factors, not elsewhere classified: Secondary | ICD-10-CM | POA: Insufficient documentation

## 2023-12-05 DIAGNOSIS — F331 Major depressive disorder, recurrent, moderate: Secondary | ICD-10-CM | POA: Diagnosis not present

## 2023-12-05 DIAGNOSIS — I700 Atherosclerosis of aorta: Secondary | ICD-10-CM | POA: Insufficient documentation

## 2023-12-05 NOTE — Progress Notes (Signed)
 No sign of peripheral artery disease causing your leg pain. Her vascular ultrasound is normal. I recommend a referral to neurology at this point for her symptoms if she is ok with this. Please let me know. I sent her this message also.

## 2023-12-07 ENCOUNTER — Other Ambulatory Visit: Payer: Self-pay | Admitting: Family Medicine

## 2023-12-07 DIAGNOSIS — I7121 Aneurysm of the ascending aorta, without rupture: Secondary | ICD-10-CM

## 2023-12-07 DIAGNOSIS — R931 Abnormal findings on diagnostic imaging of heart and coronary circulation: Secondary | ICD-10-CM

## 2023-12-07 DIAGNOSIS — E7800 Pure hypercholesterolemia, unspecified: Secondary | ICD-10-CM

## 2023-12-07 NOTE — Progress Notes (Signed)
 Please see my note regarding her CT coronary calcium  result and newly found small thoracic aortic aneurysm.  I have placed a referral to cardiology and they should contact her to schedule.  Please see if she has any questions.

## 2023-12-12 DIAGNOSIS — F331 Major depressive disorder, recurrent, moderate: Secondary | ICD-10-CM | POA: Diagnosis not present

## 2023-12-12 DIAGNOSIS — F411 Generalized anxiety disorder: Secondary | ICD-10-CM | POA: Diagnosis not present

## 2023-12-15 DIAGNOSIS — F1021 Alcohol dependence, in remission: Secondary | ICD-10-CM | POA: Diagnosis not present

## 2023-12-15 DIAGNOSIS — F411 Generalized anxiety disorder: Secondary | ICD-10-CM | POA: Diagnosis not present

## 2023-12-15 DIAGNOSIS — F3341 Major depressive disorder, recurrent, in partial remission: Secondary | ICD-10-CM | POA: Diagnosis not present

## 2023-12-18 ENCOUNTER — Institutional Professional Consult (permissible substitution) (INDEPENDENT_AMBULATORY_CARE_PROVIDER_SITE_OTHER): Admitting: Physician Assistant

## 2023-12-20 ENCOUNTER — Encounter: Payer: Self-pay | Admitting: Family Medicine

## 2023-12-20 DIAGNOSIS — R200 Anesthesia of skin: Secondary | ICD-10-CM

## 2023-12-21 ENCOUNTER — Encounter: Payer: Self-pay | Admitting: Neurology

## 2023-12-21 NOTE — Telephone Encounter (Signed)
 Please advise if neuro referral is appropriate

## 2023-12-21 NOTE — Addendum Note (Signed)
 Addended by: Dolph Tavano E on: 12/21/2023 02:21 PM   Modules accepted: Orders

## 2023-12-25 NOTE — Progress Notes (Signed)
 Cardiology Office Note   Date:  12/26/2023  ID:  Nancy Smith, DOB 04-Jan-1956, MRN [id number] PCP: Lendia Boby CROME, NP-C  [facility] Providers Cardiologist:  Emeline FORBES Calender, MD     History of Present Illness Nancy Smith is a 68 y.o. female with a past medical history of aortic atherosclerosis, thoracic aortic aneurysm, GERD, hyperlipidemia, anxiety who was initially seen by Dr. Alvan in 2023 for palpitations and underwent 7-day Zio patch which was unremarkable.  She returns today to discuss her lipids.  She has not had any chest pain or shortness of breath.  She has been increasing her activity lately and plans to exercise in the future.  She also tries to eat healthy and cooks at home and will start working on a Mediterranean diet.  Denies any tobacco.  She mentions that she has had intermittent tinnitus as well as bilateral lower calf pain that improves with exercise.  ABIs were unremarkable in the past  Family history: Mother had CABG and passed of cancer.  Father had heart disease.  Brother had CABG and heart failure secondary to lifestyle  ROS:  Review of Systems  All other systems reviewed and are negative.   Physical Exam  Physical Exam Vitals and nursing note reviewed.  Constitutional:      Appearance: Normal appearance.  HENT:     Head: Normocephalic and atraumatic.  Eyes:     Conjunctiva/sclera: Conjunctivae normal.  Neck:     Vascular: No carotid bruit.  Cardiovascular:     Rate and Rhythm: Normal rate and regular rhythm.  Pulmonary:     Effort: Pulmonary effort is normal.     Breath sounds: Normal breath sounds.  Musculoskeletal:        General: No swelling or tenderness.  Skin:    Coloration: Skin is not jaundiced or pale.  Neurological:     Mental Status: She is alert.     VS:  BP 132/82   Ht 5' 3 (1.6 m)   Wt 123 lb (55.8 kg)   BMI 21.79 kg/m         Wt Readings from Last 3 Encounters:  12/26/23 123 lb (55.8 kg)  11/24/23 122 lb  (55.3 kg)  04/27/23 121 lb (54.9 kg)     EKG Interpretation Date/Time:    Ventricular Rate:    PR Interval:    QRS Duration:    QT Interval:    QTC Calculation:   R Axis:      Text Interpretation:      Studies Reviewed   Coronary calcium  score 12/05/2023: 57-notable in LAD.  65th percentile for age, race and sex matched controls.   Bilateral lower extremity ABI 12/04/2023: Summary:  Right: Resting right ankle-brachial index is within normal range. The right toe-brachial index is normal.   Left: Resting left ankle-brachial index is within normal range. The left toe-brachial index is abnormal.      Risk Assessment/Calculations              ASSESSMENT  Elevated calcium  score score: 57 in LAD.  Family history of CAD Ascending aortic aneurysm 4.1 thoracic aortic aneurysm noted on calcium  score CT Hyperlipidemia did not tolerate rosuvastatin  5 mg Aortic atherosclerosis noted on CT ASCVD risk score: The 10-year ASCVD risk score (Arnett DK, et al., 2019) is: 9.2%   Values used to calculate the score:     Age: 89 years     Clincally relevant sex: Female     Is Non-Hispanic African  American: No     Diabetic: No     Tobacco smoker: No     Systolic Blood Pressure: 132 mmHg     Is BP treated: No     HDL Cholesterol: 62.6 mg/dL     Total Cholesterol: 163 mg/dL    Plan  Will trial simvastatin  5 mg daily Echo in 1 year to follow-up on the aortic aneurysm Lipid panel Cardiac risk counseling and prevention recommendations: Heart healthy/Mediterranean diet with whole grains, fruits, vegetable, fish, lean meats, nuts, and olive oil. Limit salt. Moderate walking, 3-5 times/week for 30-50 minutes each session. Aim for at least 150 minutes.week. Goal should be pace of 3 miles/hour, or walking 1.5 miles in 30 minutes Avoidance of tobacco products. Avoid excess alcohol.  Follow up: 1 year          Signed, Emeline FORBES Calender, MD

## 2023-12-26 ENCOUNTER — Encounter: Payer: Self-pay | Admitting: Internal Medicine

## 2023-12-26 ENCOUNTER — Ambulatory Visit: Attending: Internal Medicine | Admitting: Internal Medicine

## 2023-12-26 VITALS — BP 132/82 | Ht 63.0 in | Wt 123.0 lb

## 2023-12-26 DIAGNOSIS — E7800 Pure hypercholesterolemia, unspecified: Secondary | ICD-10-CM

## 2023-12-26 DIAGNOSIS — I7121 Aneurysm of the ascending aorta, without rupture: Secondary | ICD-10-CM | POA: Diagnosis not present

## 2023-12-26 DIAGNOSIS — F331 Major depressive disorder, recurrent, moderate: Secondary | ICD-10-CM | POA: Diagnosis not present

## 2023-12-26 DIAGNOSIS — F411 Generalized anxiety disorder: Secondary | ICD-10-CM | POA: Diagnosis not present

## 2023-12-26 DIAGNOSIS — R931 Abnormal findings on diagnostic imaging of heart and coronary circulation: Secondary | ICD-10-CM | POA: Diagnosis not present

## 2023-12-26 DIAGNOSIS — I700 Atherosclerosis of aorta: Secondary | ICD-10-CM | POA: Diagnosis not present

## 2023-12-26 MED ORDER — SIMVASTATIN 5 MG PO TABS: 5.0000 mg | ORAL_TABLET | Freq: Every day | ORAL | 3 refills | Status: DC

## 2023-12-26 NOTE — Patient Instructions (Signed)
 Medication Instructions:  START Simvastatin  (Zocor ) 5 mg tablet daily at bedtime  Lab Work: Lipid panel in 1 year  Testing/Procedures: Your physician has requested that you have an echocardiogram in 1 year. Echocardiography is a painless test that uses sound waves to create images of your heart. It provides your doctor with information about the size and shape of your heart and how well your heart's chambers and valves are working. This procedure takes approximately one hour. There are no restrictions for this procedure. Please do NOT wear cologne, perfume, aftershave, or lotions (deodorant is allowed). Please arrive 15 minutes prior to your appointment time.  Please note: We ask at that you not bring children with you during ultrasound (echo/ vascular) testing. Due to room size and safety concerns, children are not allowed in the ultrasound rooms during exams. Our front office staff cannot provide observation of children in our lobby area while testing is being conducted. An adult accompanying a patient to their appointment will only be allowed in the ultrasound room at the discretion of the ultrasound technician under special circumstances. We apologize for any inconvenience.   Follow-Up: At [facility], you and your health needs are our priority.  As part of our continuing mission to provide you with exceptional heart care, our providers are all part of one team.  This team includes your primary Cardiologist (physician) and Advanced Practice Providers or APPs (Physician Assistants and Nurse Practitioners) who all work together to provide you with the care you need, when you need it.  Your next appointment:   1 year(s)  Provider:   Emeline FORBES Calender, MD

## 2023-12-27 ENCOUNTER — Institutional Professional Consult (permissible substitution) (INDEPENDENT_AMBULATORY_CARE_PROVIDER_SITE_OTHER): Admitting: Physician Assistant

## 2024-01-03 DIAGNOSIS — F411 Generalized anxiety disorder: Secondary | ICD-10-CM | POA: Diagnosis not present

## 2024-01-03 DIAGNOSIS — F331 Major depressive disorder, recurrent, moderate: Secondary | ICD-10-CM | POA: Diagnosis not present

## 2024-01-04 ENCOUNTER — Encounter: Payer: Self-pay | Admitting: Family Medicine

## 2024-01-04 ENCOUNTER — Ambulatory Visit (INDEPENDENT_AMBULATORY_CARE_PROVIDER_SITE_OTHER)

## 2024-01-04 ENCOUNTER — Ambulatory Visit: Admitting: Family Medicine

## 2024-01-04 ENCOUNTER — Ambulatory Visit: Payer: Self-pay | Admitting: Family Medicine

## 2024-01-04 VITALS — BP 120/82 | HR 63 | Temp 97.6°F | Ht 63.0 in | Wt 124.0 lb

## 2024-01-04 DIAGNOSIS — R053 Chronic cough: Secondary | ICD-10-CM | POA: Diagnosis not present

## 2024-01-04 DIAGNOSIS — Z79899 Other long term (current) drug therapy: Secondary | ICD-10-CM | POA: Diagnosis not present

## 2024-01-04 DIAGNOSIS — K219 Gastro-esophageal reflux disease without esophagitis: Secondary | ICD-10-CM

## 2024-01-04 DIAGNOSIS — E559 Vitamin D deficiency, unspecified: Secondary | ICD-10-CM | POA: Diagnosis not present

## 2024-01-04 DIAGNOSIS — M810 Age-related osteoporosis without current pathological fracture: Secondary | ICD-10-CM | POA: Diagnosis not present

## 2024-01-04 DIAGNOSIS — H9190 Unspecified hearing loss, unspecified ear: Secondary | ICD-10-CM

## 2024-01-04 DIAGNOSIS — R0689 Other abnormalities of breathing: Secondary | ICD-10-CM

## 2024-01-04 DIAGNOSIS — E7800 Pure hypercholesterolemia, unspecified: Secondary | ICD-10-CM

## 2024-01-04 DIAGNOSIS — I700 Atherosclerosis of aorta: Secondary | ICD-10-CM

## 2024-01-04 NOTE — Progress Notes (Addendum)
 Subjective:     Patient ID: Nancy Smith, female    DOB: April 28, 1955, 68 y.o.   MRN: [id number]  Chief Complaint  Patient presents with   Medical Management of Chronic Issues    6 week f/u  Discuss refill of trelegy inhaler, was prescribed it last year and was using it as needed    HPI  Discussed the use of AI scribe software for clinical note transcription with the patient, who gave verbal consent to proceed.  History of Present Illness Nancy Smith is a 68 year old female who presents for follow-up on her chronic health conditions.  Dyslipidemia management - Currently taking simvastatin  5 mg daily for three days after a delay due to pharmacy issues -Simvastatin  prescribed by cardiologist.  She is followed Dr. Reeta 12/26/2023 for atherosclerosis elevated CT coronary calcium  score and hyperlipidemia - She did not tolerate rosuvastatin  - ABIs unremarkable  Lower extremity neuropathic symptoms - Burning sensation in the left leg - No impact on mobility - Awaiting neurology appointment at the end of November - Continues Cymbalta per psychiatrist   Chronic cough - Chronic cough previously attributed to acid reflux - Managed with omeprazole  - Occasionally uses Trelegy inhaler with perceived benefit - No pulmonary function testing performed but ordered by pulmonologist  - Needs follow up with Dr. True  - Cough described as a tickle in the throat, possibly related to allergies or acid reflux  Osteoporosis management - Started alendronate  5 weeks ago  - Takes alendronate  (Fosamax ) weekly without issues - Adjusts routine to take alendronate  without coffee     Health Maintenance Due  Topic Date Due   Pneumococcal Vaccine: 50+ Years (2 of 2 - PPSV23, PCV20, or PCV21) 04/19/2019   DTaP/Tdap/Td (2 - Tdap) 10/20/2020   Medicare Annual Wellness (AWV)  04/20/2023   Colonoscopy  04/11/2024    Past Medical History:  Diagnosis Date   Bronchitis    Colitis     Lymphocytic colitis; had 2 flares in her lifetime   Hx of appendicitis    Hyperlipidemia     Past Surgical History:  Procedure Laterality Date   APPENDECTOMY     EYE SURGERY  1996   HERNIA REPAIR  1983   Femoral   MOHS SURGERY     for Melanoma   TONSILLECTOMY  1960    Family History  Problem Relation Age of Onset   Heart failure Mother    Lung cancer Mother        Smoker   CAD Mother        s/p bypass   Osteoporosis Mother    Heart failure Father    Hyperlipidemia Father    Heart disease Father    Atrial fibrillation Father    Congestive Heart Failure Brother    Liver disease Neg Hx    Esophageal cancer Neg Hx    Colon cancer Neg Hx    Breast cancer Neg Hx    BRCA 1/2 Neg Hx     Social History   Socioeconomic History   Marital status: Married    Spouse name: Not on file   Number of children: 0   Years of education: Not on file   Highest education level: Master's degree (e.g., MA, MS, MEng, MEd, MSW, MBA)  Occupational History   Occupation: retired  Tobacco Use   Smoking status: Former    Current packs/day: 0.00    Average packs/day: 0.3 packs/day for 15.0 years (3.8 ttl pk-yrs)    Types:  Cigarettes    Start date: 28    Quit date: 1994    Years since quitting: 31.7   Smokeless tobacco: Never  Vaping Use   Vaping status: Never Used  Substance and Sexual Activity   Alcohol use: Not Currently   Drug use: Never   Sexual activity: Not Currently    Comment: More than 5 parnters, Younger than 16 IC, No STD, No abd pap, no des exposure, no breast cancer meds  Other Topics Concern   Not on file  Social History Narrative   Not on file   Social Drivers of Health   Financial Resource Strain: Low Risk  (04/19/2022)   Overall Financial Resource Strain (CARDIA)    Difficulty of Paying Living Expenses: Not hard at all  Food Insecurity: No Food Insecurity (04/19/2022)   Hunger Vital Sign    Worried About Running Out of Food in the Last Year: Never true    Ran  Out of Food in the Last Year: Never true  Transportation Needs: No Transportation Needs (04/19/2022)   PRAPARE - Administrator, Civil Service (Medical): No    Lack of Transportation (Non-Medical): No  Physical Activity: Sufficiently Active (04/19/2022)   Exercise Vital Sign    Days of Exercise per Week: 7 days    Minutes of Exercise per Session: 50 min  Stress: No Stress Concern Present (04/19/2022)   [employer] of Occupational Health - Occupational Stress Questionnaire    Feeling of Stress : Not at all  Social Connections: Socially Integrated (04/19/2022)   Social Connection and Isolation Panel    Frequency of Communication with Friends and Family: More than three times a week    Frequency of Social Gatherings with Friends and Family: Once a week    Attends Religious Services: 1 to 4 times per year    Active Member of [employer] or Organizations: Yes    Attends Banker Meetings: 1 to 4 times per year    Marital Status: Married  Catering manager Violence: Not At Risk (04/19/2022)   Humiliation, Afraid, Rape, and Kick questionnaire    Fear of Current or Ex-Partner: No    Emotionally Abused: No    Physically Abused: No    Sexually Abused: No    Outpatient Medications Prior to Visit  Medication Sig Dispense Refill   alendronate  (FOSAMAX ) 70 MG tablet Take 1 tablet (70 mg total) by mouth every 7 (seven) days. Take with a full glass of water on an empty stomach. 4 tablet 11   baclofen (LIORESAL) 10 MG tablet Take by mouth as needed for muscle spasms.     cetirizine  (ZYRTEC  ALLERGY) 10 MG tablet Take 1 tablet (10 mg total) by mouth daily. 90 tablet 3   DULoxetine (CYMBALTA) 60 MG capsule Take 60 mg by mouth every morning.     hydrOXYzine (ATARAX) 10 MG tablet Take 5-10 mg by mouth daily as needed.     omeprazole  (PRILOSEC) 20 MG capsule Take 1 capsule (20 mg total) by mouth daily. 30 capsule 3   simvastatin  (ZOCOR ) 5 MG tablet Take 1 tablet (5 mg total) by mouth at  bedtime. 90 tablet 3   traZODone  (DESYREL ) 100 MG tablet Take 100 mg by mouth at bedtime.     tretinoin (RETIN-A) 0.025 % cream daily.     No facility-administered medications prior to visit.    No Known Allergies  Review of Systems  Constitutional:  Negative for chills, fever and malaise/fatigue.  HENT:  Positive for hearing loss. Negative for congestion, sore throat and tinnitus.   Respiratory:  Positive for cough. Negative for sputum production, shortness of breath and wheezing.   Cardiovascular:  Negative for chest pain, palpitations and leg swelling.  Gastrointestinal:  Negative for abdominal pain, constipation, diarrhea, nausea and vomiting.  Genitourinary:  Negative for dysuria, frequency and urgency.  Musculoskeletal:  Positive for myalgias.  Neurological:  Negative for dizziness, focal weakness and headaches.       Objective:    Physical Exam Constitutional:      General: She is not in acute distress.    Appearance: She is not ill-appearing.  HENT:     Mouth/Throat:     Mouth: Mucous membranes are moist.     Pharynx: Oropharynx is clear.  Eyes:     Extraocular Movements: Extraocular movements intact.     Conjunctiva/sclera: Conjunctivae normal.     Pupils: Pupils are equal, round, and reactive to light.  Cardiovascular:     Rate and Rhythm: Normal rate and regular rhythm.  Pulmonary:     Effort: Pulmonary effort is normal.     Breath sounds: Examination of the left-lower field reveals decreased breath sounds. Decreased breath sounds present.  Musculoskeletal:     Cervical back: Normal range of motion and neck supple.     Right lower leg: No edema.     Left lower leg: No edema.  Skin:    General: Skin is warm and dry.  Neurological:     General: No focal deficit present.     Mental Status: She is alert and oriented to person, place, and time.  Psychiatric:        Mood and Affect: Mood normal.        Behavior: Behavior normal.        Thought Content: Thought  content normal.      BP 120/82   Pulse 63   Temp 97.6 F (36.4 C) (Temporal)   Ht 5' 3 (1.6 m)   Wt 124 lb (56.2 kg)   SpO2 98%   BMI 21.97 kg/m  Wt Readings from Last 3 Encounters:  01/04/24 124 lb (56.2 kg)  12/26/23 123 lb (55.8 kg)  11/24/23 122 lb (55.3 kg)       Assessment & Plan:   Problem List Items Addressed This Visit     Gastroesophageal reflux disease (Chronic)   Aortic atherosclerosis   Relevant Orders   Comprehensive metabolic panel with GFR   Lipid panel   Chronic cough   Relevant Orders   DG Chest 2 View   Osteoporosis without current pathological fracture   Relevant Orders   Lipid panel   CBC with Differential/Platelet   Vitamin D  deficiency   Other Visit Diagnoses       Hypercholesterolemia    -  Primary   Relevant Orders   Comprehensive metabolic panel with GFR   Lipid panel     Decreased hearing, unspecified laterality         Decreased breath sounds at left lung base       Relevant Orders   DG Chest 2 View     Medication management       Relevant Orders   Comprehensive metabolic panel with GFR   Lipid panel   CBC with Differential/Platelet       Assessment and Plan Assessment & Plan Hyperlipidemia and aortic atherosclerosis Recently started on simvastatin  5 mg by cardiologist. Has taken it for three days. - Check cholesterol levels  after 6-8 weeks of simvastatin  therapy - Schedule follow-up appointment in 3-4 months to review cholesterol levels and adjust treatment if necessary  Osteoporosis Currently taking alendronate  (Fosamax ) once a week. Tolerating well without any issues. - Recheck CBC, calcium  in approximately 6-8 weeks   Chronic cough and gastroesophageal reflux disease (GERD) Chronic cough possibly related to GERD. Previously diagnosed with acid reflux, which can cause cough. Considering differential diagnosis including allergies and asthma/COPD. Trelegy inhaler provides some relief, suggesting possible asthma/COPD  component. -Pulmonologist ordered pulmonary function test to assess for asthma/COPD but she was unable to have the test done at that time.  She will call [place] pulmonology to schedule. - Continue omeprazole  for GERD management - Consider allergy treatment if cough persists despite GERD management  Left lower lobe diminished lung sounds - Chest x-ray ordered in setting of chronic cough - CT coronary calcium  past in August showed clear lungs  Left leg burning sensation (paresthesia) Intermittent burning sensation in the left leg. - Neurology appointment scheduled for the end of November to further evaluate - On Cymbalta for mood which may be beneficial   Decreased hearing Reports occasional difficulty hearing, especially in noisy environments. No current tinnitus. - Discussed potential audiology evaluation in the future if hearing issues persist     I am having Lamarr Sick maintain her cetirizine , traZODone , omeprazole , alendronate , baclofen, DULoxetine, hydrOXYzine, tretinoin, and simvastatin .  No orders of the defined types were placed in this encounter.

## 2024-01-04 NOTE — Addendum Note (Signed)
 Addended by: Jalilah Wiltsie L on: 01/04/2024 10:43 AM   Modules accepted: Orders

## 2024-01-04 NOTE — Patient Instructions (Signed)
 Please schedule a follow-up for labs in approximately 6 to 7 weeks for fasting cholesterol.  Come in hydrated by drinking 1 to 2 glasses of water that morning.  Let me know if you need the referral to audiology for hearing loss  Treat acid reflux to see if it helps with cough.  I encourage you to call pulmonology and schedule your pulmonary function test and follow-up with your pulmonologist.

## 2024-01-05 ENCOUNTER — Ambulatory Visit: Admitting: Family Medicine

## 2024-01-09 DIAGNOSIS — F331 Major depressive disorder, recurrent, moderate: Secondary | ICD-10-CM | POA: Diagnosis not present

## 2024-01-09 DIAGNOSIS — F411 Generalized anxiety disorder: Secondary | ICD-10-CM | POA: Diagnosis not present

## 2024-01-10 DIAGNOSIS — F1021 Alcohol dependence, in remission: Secondary | ICD-10-CM | POA: Diagnosis not present

## 2024-01-10 DIAGNOSIS — F411 Generalized anxiety disorder: Secondary | ICD-10-CM | POA: Diagnosis not present

## 2024-01-10 DIAGNOSIS — F3341 Major depressive disorder, recurrent, in partial remission: Secondary | ICD-10-CM | POA: Diagnosis not present

## 2024-01-16 DIAGNOSIS — F331 Major depressive disorder, recurrent, moderate: Secondary | ICD-10-CM | POA: Diagnosis not present

## 2024-01-16 DIAGNOSIS — F411 Generalized anxiety disorder: Secondary | ICD-10-CM | POA: Diagnosis not present

## 2024-01-18 ENCOUNTER — Telehealth: Payer: Self-pay | Admitting: *Deleted

## 2024-01-18 DIAGNOSIS — R0600 Dyspnea, unspecified: Secondary | ICD-10-CM

## 2024-01-18 NOTE — Telephone Encounter (Signed)
 Copied from CRM #[id number]. Topic: Clinical - Request for Lab/Test Order >> Jan 15, 2024 11:43 AM Leila BROCKS wrote: Reason for CRM: Patient [phone] states was suppose to have PFT and f/u on the same day appointment wih Dr. Theophilus and cancelled the appointments 08/14/23. Patient is asking if the her order has expired. Informed patient, will send a message since there's no order PFT and schedule follow up with Dr. Theophilus same day. Please advise and call back.  I have placed order for the PFT. I sent communication to the pt to let her know she can call and schedule PFT and ov with Mannam.

## 2024-01-23 DIAGNOSIS — F331 Major depressive disorder, recurrent, moderate: Secondary | ICD-10-CM | POA: Diagnosis not present

## 2024-01-23 DIAGNOSIS — F411 Generalized anxiety disorder: Secondary | ICD-10-CM | POA: Diagnosis not present

## 2024-02-05 ENCOUNTER — Encounter: Payer: Self-pay | Admitting: Neurology

## 2024-02-05 ENCOUNTER — Ambulatory Visit: Admitting: Neurology

## 2024-02-07 DIAGNOSIS — F411 Generalized anxiety disorder: Secondary | ICD-10-CM | POA: Diagnosis not present

## 2024-02-07 DIAGNOSIS — F331 Major depressive disorder, recurrent, moderate: Secondary | ICD-10-CM | POA: Diagnosis not present

## 2024-02-13 DIAGNOSIS — M546 Pain in thoracic spine: Secondary | ICD-10-CM | POA: Diagnosis not present

## 2024-02-13 DIAGNOSIS — G8929 Other chronic pain: Secondary | ICD-10-CM | POA: Diagnosis not present

## 2024-02-13 DIAGNOSIS — M4150 Other secondary scoliosis, site unspecified: Secondary | ICD-10-CM | POA: Diagnosis not present

## 2024-02-20 DIAGNOSIS — F411 Generalized anxiety disorder: Secondary | ICD-10-CM | POA: Diagnosis not present

## 2024-02-20 DIAGNOSIS — F331 Major depressive disorder, recurrent, moderate: Secondary | ICD-10-CM | POA: Diagnosis not present

## 2024-03-04 ENCOUNTER — Ambulatory Visit: Payer: Self-pay | Admitting: Family Medicine

## 2024-03-04 ENCOUNTER — Ambulatory Visit: Admitting: Family Medicine

## 2024-03-04 VITALS — BP 122/78 | HR 62 | Temp 97.6°F | Ht 63.0 in | Wt 124.6 lb

## 2024-03-04 DIAGNOSIS — R102 Pelvic and perineal pain unspecified side: Secondary | ICD-10-CM | POA: Diagnosis not present

## 2024-03-04 DIAGNOSIS — R300 Dysuria: Secondary | ICD-10-CM | POA: Diagnosis not present

## 2024-03-04 DIAGNOSIS — K5900 Constipation, unspecified: Secondary | ICD-10-CM | POA: Diagnosis not present

## 2024-03-04 LAB — POCT URINALYSIS DIPSTICK
Bilirubin, UA: NEGATIVE
Blood, UA: NEGATIVE
Glucose, UA: NEGATIVE
Ketones, UA: NEGATIVE
Leukocytes, UA: NEGATIVE
Nitrite, UA: NEGATIVE
Protein, UA: POSITIVE — AB
Spec Grav, UA: >=1.03 — AB (ref 1.010–1.025)
Urobilinogen, UA: 0.2 U/dL
pH, UA: 6 (ref 5.0–8.0)

## 2024-03-04 MED ORDER — NITROFURANTOIN MONOHYD MACRO 100 MG PO CAPS: 100.0000 mg | ORAL_CAPSULE | Freq: Two times a day (BID) | ORAL | 0 refills | Status: DC

## 2024-03-04 NOTE — Patient Instructions (Signed)
 Start the antibiotic.  Work on the constipation.  Try taking 2-3 capfuls of MiraLAX daily for the next 2 to 3 days and then if you start having diarrhea, back off on the amount of MiraLAX.   If your symptoms are not improving and your urine culture is negative, I would then recommend following up with urology.

## 2024-03-04 NOTE — Progress Notes (Signed)
 Subjective:  @ Discussed the use of AI scribe software for clinical note transcription with the patient, who gave verbal consent to proceed.  History of Present Illness Nancy Smith is a 68 year old female with interstitial cystitis who presents with dysuria and urinary retention.  Dysuria and pelvic pain - Dysuria and urinary retention since Saturday - Pressure and burning sensation in the pelvic area, particularly at the urethra - Urinary leakage present, this is not new - No prior history of urinary tract infection - Uncertain if current symptoms are related to interstitial cystitis. Hx of IC but not flare up since ?2010  Urinary retention and incontinence - Sensation of incomplete bladder emptying - Urinary leakage requiring frequent use of pads to avoid irritation - History of seeing a urogynecologist - Pelvic floor physical therapy was not beneficial  Interstitial cystitis - Previously diagnosed interstitial cystitis - No flares in recent years - Past flares involved pelvic wall burning, not associated with urination  Bowel habits and constipation - Constipation managed with senna, taken last night  Lifestyle and self-management - Drinks mostly water - Has not taken over-the-counter medications for current symptoms - Practices yoga     Past Medical History:  Diagnosis Date   Bronchitis    Colitis    Lymphocytic colitis; had 2 flares in her lifetime   Hx of appendicitis    Hyperlipidemia     ROS as in subjective  Reviewed allergies, medications, past medical, surgical, and social history.    Objective: Vitals:   03/04/24 1436  BP: 122/78  Pulse: 62  Temp: 97.6 F (36.4 C)  SpO2: 98%    General appearance: alert, no distress, WD/WN, female Abdomen: +bs, soft, TTP bilateral lower abdomen, non distended Back: no CVA tenderness      Laboratory:  Urine dipstick: negative for all components.  Specific gravity >1.030      Assessment/Plan:  Assessment and Plan Assessment & Plan Dysuria and urinary retention Reports dysuria and urinary retention since Saturday, with pressure and deep burning pain in the urethral area. Urinalysis does not indicate infection, but symptoms persist. Differential includes urinary tract infection (UTI) and interstitial cystitis (IC). - Ordered urine culture to confirm infection - Macrobid  sent - Will consider referral to urologist if symptoms do not resolve   Pelvic pain Reports pelvic pain with burning sensation in the pelvic wall. Differential includes interstitial cystitis and urinary retention. - Will consider referral to urologist if symptoms persist  Constipation Reports constipation, which may contribute to pelvic pressure. Constipation can cause pressure and discomfort in the pelvic region. - Recommended increasing Miralax to 2-3 capfuls daily in 6-8 ounces of liquid or taking Senna with Colace which has worked in the past  History of interstitial cystitis Diagnosed with interstitial cystitis, with no flare-ups since 2010. Current symptoms may suggest a recurrence. - Will consider referral to urologist if symptoms persist

## 2024-03-05 ENCOUNTER — Encounter: Payer: Self-pay | Admitting: Neurology

## 2024-03-05 ENCOUNTER — Ambulatory Visit: Admitting: Neurology

## 2024-03-05 VITALS — BP 134/78 | HR 71 | Ht 63.0 in | Wt 126.0 lb

## 2024-03-05 DIAGNOSIS — G4762 Sleep related leg cramps: Secondary | ICD-10-CM

## 2024-03-05 DIAGNOSIS — F411 Generalized anxiety disorder: Secondary | ICD-10-CM | POA: Diagnosis not present

## 2024-03-05 DIAGNOSIS — F331 Major depressive disorder, recurrent, moderate: Secondary | ICD-10-CM | POA: Diagnosis not present

## 2024-03-05 DIAGNOSIS — R208 Other disturbances of skin sensation: Secondary | ICD-10-CM

## 2024-03-05 LAB — URINE CULTURE: Result:: NO GROWTH

## 2024-03-05 NOTE — Progress Notes (Signed)
 [facility] Clinic Note - Initial Visit   Date: 03/05/2024   Nancy Smith MRN: [id number] DOB: 06-05-1955   Dear Nancy Mackintosh, NP-C:  Thank you for your kind referral of Nancy Smith for consultation of bilateral leg pain. Although her history is well known to you, please allow us  to reiterate it for the purpose of our medical record. The patient was accompanied to the clinic by self.    Nancy Smith is a 68 y.o. right-handed female with hyperlipidemia, osteoporosis, depression, and GERD presenting for evaluation of bilateral leg pain.   IMPRESSION/PLAN: Nocturnal leg cramps, improved. OK to take baclofen as needed.  Encouraged posterior leg stretches before bedtime and hydration. Bilateral lower leg pain and dysesthesias, resolved after taking vitamin B12 supplements.  Return to clinic as needed  ------------------------------------------------------------- History of present illness:  Discussed the use of AI scribe software for clinical note transcription with the patient, who gave verbal consent to proceed.  History of Present Illness Nancy Smith is a 68 year old female with scoliosis who presents with leg pain and cramps.  She began experiencing leg pain and cramps in February 2025. Initially, the cramps occurred at night and progressed to a burning sensation in the front of both legs. At the time, she was taking a statin, which she discontinued, resulting in some symptom improvement. Despite this, symptoms persisted until she started vitamin B12 and vitamin D  supplements. Since starting the supplements, the symptoms have almost completely resolved, with no more burning or cramps.  Currently, she has very transient feeling of heaviness or fullness in her legs upon waking, which quickly resolves after she gets up and moves around.   She has a history of scoliosis, which causes back pain. She has undergone physical therapy and dry needling for  this condition and performs exercises regularly. She is followed by spine specialist in [facility] for this.  She takes baclofen as needed, particularly during long car rides, to manage muscle pain associated with scoliosis.  No diabetes, smoking, or alcohol use. She lives in a condo with stairs, which she navigates daily without difficulty.   Out-side paper records, electronic medical record, and images have been reviewed where available and summarized as:  MRI lumbar spine 08/23/2023: 1. Multilevel lumbar spondylosis, most pronounced at L4-5 and L5-S1. 2. Moderate left and mild right foraminal stenosis at L4-5. 3. Mild-moderate bilateral foraminal stenosis at L5-S1. 4. No significant canal stenosis at any level.  MRI thoracic spine wo contrast 06/09/2023: IMPRESSION: No evidence of high-grade spinal canal or neural foraminal stenosis.     Lab Results  Component Value Date   HGBA1C 5.4 03/01/2021   Lab Results  Component Value Date   VITAMINB12 372 11/24/2023   Lab Results  Component Value Date   TSH 1.52 11/24/2023   No results found for: ESRSEDRATE, POCTSEDRATE  Past Medical History:  Diagnosis Date   Bronchitis    Colitis    Lymphocytic colitis; had 2 flares in her lifetime   Hx of appendicitis    Hyperlipidemia     Past Surgical History:  Procedure Laterality Date   APPENDECTOMY     EYE SURGERY  1996   HERNIA REPAIR  1983   Femoral   MOHS SURGERY     for Melanoma   TONSILLECTOMY  1960     Medications:  Outpatient Encounter Medications as of 03/05/2024  Medication Sig   alendronate  (FOSAMAX ) 70 MG tablet Take 1 tablet (70 mg total) by mouth every 7 (  seven) days. Take with a full glass of water on an empty stomach.   baclofen (LIORESAL) 10 MG tablet Take by mouth as needed for muscle spasms.   cetirizine  (ZYRTEC  ALLERGY) 10 MG tablet Take 1 tablet (10 mg total) by mouth daily.   DULoxetine (CYMBALTA) 60 MG capsule Take 60 mg by mouth every morning.    hydrOXYzine (ATARAX) 10 MG tablet Take 5-10 mg by mouth daily as needed.   nitrofurantoin , macrocrystal-monohydrate, (MACROBID ) 100 MG capsule Take 1 capsule (100 mg total) by mouth 2 (two) times daily.   omeprazole  (PRILOSEC) 20 MG capsule Take 1 capsule (20 mg total) by mouth daily.   simvastatin  (ZOCOR ) 5 MG tablet Take 1 tablet (5 mg total) by mouth at bedtime. (Patient taking differently: Take 5 mg by mouth at bedtime. Taking in the morning)   traZODone  (DESYREL ) 100 MG tablet Take 100 mg by mouth at bedtime.   tretinoin (RETIN-A) 0.025 % cream daily.   No facility-administered encounter medications on file as of 03/05/2024.    Allergies: No Known Allergies  Family History: Family History  Problem Relation Age of Onset   Heart failure Mother    Lung cancer Mother        Smoker   CAD Mother        s/p bypass   Osteoporosis Mother    Heart failure Father    Hyperlipidemia Father    Heart disease Father    Atrial fibrillation Father    Congestive Heart Failure Brother    Liver disease Neg Hx    Esophageal cancer Neg Hx    Colon cancer Neg Hx    Breast cancer Neg Hx    BRCA 1/2 Neg Hx     Social History: Social History   Tobacco Use   Smoking status: Former    Current packs/day: 0.00    Average packs/day: 0.3 packs/day for 15.0 years (3.8 ttl pk-yrs)    Types: Cigarettes    Start date: 2    Quit date: 1994    Years since quitting: 31.9   Smokeless tobacco: Never  Vaping Use   Vaping status: Never Used  Substance Use Topics   Alcohol use: Not Currently   Drug use: Never   Social History   Social History Narrative   Are you right handed or left handed? Right Handed   Are you currently employed ? 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 No    Who lives with you? Husband and a dog    What type of home do you live in: 1 story or 2 story? Lives in a condo on the 3rd floor. Has an elevator is needed.         Vital Signs:  BP 134/78   Pulse 71   Ht 5' 3 (1.6 m)   Wt 126 lb (57.2 kg)   SpO2 96%   BMI 22.32 kg/m   Neurological Exam: MENTAL STATUS including orientation to time, place, person, recent and remote memory, attention span and concentration, language, and fund of knowledge is normal.  Speech is not dysarthric.  CRANIAL NERVES: II:  No visual field defects.     III-IV-VI: Pupils equal round and reactive to light.  Normal conjugate, extra-ocular eye movements in all directions of gaze.  No nystagmus.  No ptosis.   V:  Normal facial sensation.    VII:  Normal facial symmetry and movements.  VIII:  Normal hearing and vestibular function.   IX-X:  Normal palatal movement.   XI:  Normal shoulder shrug and head rotation.   XII:  Normal tongue strength and range of motion, no deviation or fasciculation.  MOTOR:  Motor strength is 5/5 throughout.  No atrophy, fasciculations or abnormal movements.  No pronator drift.   MSRs:                                           Right        Left brachioradialis 2+  2+  biceps 2+  2+  triceps 2+  2+  patellar 2+  2+  ankle jerk 2+  2+  plantar response down  down   SENSORY:  Normal and symmetric perception of light touch, temperature, and vibration.   COORDINATION/GAIT: Normal finger-to- nose-finger.  Intact rapid alternating movements bilaterally.  Gait narrow based and stable. Tandem and stressed gait intact.    Thank you for allowing me to participate in patient's care.  If I can answer any additional questions, I would be pleased to do so.    Sincerely,    Aerin Delany K. Tobie, DO

## 2024-03-12 ENCOUNTER — Ambulatory Visit

## 2024-03-12 DIAGNOSIS — R0600 Dyspnea, unspecified: Secondary | ICD-10-CM

## 2024-03-12 LAB — PULMONARY FUNCTION TEST
DL/VA % pred: 89 %
DL/VA: 3.75 ml/min/mmHg/L
DLCO unc % pred: 94 %
DLCO unc: 17.72 ml/min/mmHg
FEF 25-75 Post: 1.9 L/s
FEF 25-75 Pre: 1.58 L/s
FEF2575-%Change-Post: 20 %
FEF2575-%Pred-Post: 103 %
FEF2575-%Pred-Pre: 85 %
FEV1-%Change-Post: 5 %
FEV1-%Pred-Post: 102 %
FEV1-%Pred-Pre: 97 %
FEV1-Post: 2.23 L
FEV1-Pre: 2.12 L
FEV1FVC-%Change-Post: 2 %
FEV1FVC-%Pred-Pre: 96 %
FEV6-%Change-Post: 4 %
FEV6-%Pred-Post: 108 %
FEV6-%Pred-Pre: 103 %
FEV6-Post: 2.97 L
FEV6-Pre: 2.85 L
FEV6FVC-%Change-Post: 0 %
FEV6FVC-%Pred-Post: 103 %
FEV6FVC-%Pred-Pre: 103 %
FVC-%Change-Post: 3 %
FVC-%Pred-Post: 104 %
FVC-%Pred-Pre: 100 %
FVC-Post: 2.99 L
FVC-Pre: 2.9 L
Post FEV1/FVC ratio: 75 %
Post FEV6/FVC ratio: 99 %
Pre FEV1/FVC ratio: 73 %
Pre FEV6/FVC Ratio: 98 %
RV % pred: 126 %
RV: 2.7 L
TLC % pred: 117 %
TLC: 5.76 L

## 2024-03-12 NOTE — Progress Notes (Signed)
 Full pft performed today

## 2024-03-12 NOTE — Patient Instructions (Signed)
 Full pft performed today

## 2024-03-14 ENCOUNTER — Encounter: Payer: Self-pay | Admitting: Pulmonary Disease

## 2024-03-14 ENCOUNTER — Ambulatory Visit: Admitting: Pulmonary Disease

## 2024-03-14 VITALS — BP 138/80 | HR 62 | Temp 98.3°F | Ht 63.0 in | Wt 129.0 lb

## 2024-03-14 DIAGNOSIS — R059 Cough, unspecified: Secondary | ICD-10-CM

## 2024-03-14 DIAGNOSIS — F1010 Alcohol abuse, uncomplicated: Secondary | ICD-10-CM | POA: Diagnosis not present

## 2024-03-14 DIAGNOSIS — R0600 Dyspnea, unspecified: Secondary | ICD-10-CM

## 2024-03-14 DIAGNOSIS — Z87891 Personal history of nicotine dependence: Secondary | ICD-10-CM

## 2024-03-14 DIAGNOSIS — F411 Generalized anxiety disorder: Secondary | ICD-10-CM | POA: Diagnosis not present

## 2024-03-14 DIAGNOSIS — F3341 Major depressive disorder, recurrent, in partial remission: Secondary | ICD-10-CM | POA: Diagnosis not present

## 2024-03-14 NOTE — Progress Notes (Signed)
 Nancy Smith    [id number]    [birth year]  Primary Care Physician:Henson, Boby CROME, NP-C  Referring Physician: Shaune Westfall, MD [address] [place],  [place] [address]  Chief complaint: Consult for abnormal chest x-ray, evaluate for COPD   HPI: 68 y.o. who  has a past medical history of Bronchitis, Colitis, appendicitis, and Hyperlipidemia.   Discussed the use of AI scribe software for clinical note transcription with the patient, who gave verbal consent to proceed.  The patient, a retired runner, broadcasting/film/video, presents with concerns of possible COPD after a recent episode of bronchitis in the fall. The bronchitis lasted a few weeks and was trea actually doing Dorothyann ted with antibiotics and an inhaler. The patient reports a long-standing mild cough, which was initially diagnosed as acid reflux and improved with treatment. Recently, the patient has noticed a dry cough over the past couple of weeks.  She had a chest x-ray by primary care which showed hyperinflation suggestive of COPD and given a Trelegy inhaler.  She has not started the Trelegy inhaler as she is feeling much better.  The patient has a history of social smoking, quitting in the 1990s, and a family history of lung cancer and COPD. The patient is otherwise healthy and active, with no current respiratory symptoms.    Interim history: History of Present Illness Nancy Smith is a 68 year old female who presents for follow-up of a chest x-ray showing hyperinflation.   Pulmonary imaging findings - Chest x-ray in September demonstrated hyperinflation concerning for COPD.  Cough and respiratory symptoms - Intermittent cough and congestion, primarily associated with illnesses or reflux. - Cough improves almost completely with regular use of acid reflux medication.   Relevant pulmonary history Pets: Dog Occupation: Retired engineer, site Exposures: No mold, hot tub, Jacuzzi.  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 Take with a full glass of water on an empty stomach.   baclofen (LIORESAL) 10 MG tablet Take by mouth as needed for muscle spasms.   cetirizine  (ZYRTEC  ALLERGY) 10 MG tablet Take 1 tablet (10 mg total) by mouth daily. (Patient taking differently: Take 10 mg by mouth daily. PRN)   DULoxetine (CYMBALTA) 60 MG capsule Take 60 mg by mouth every morning.   hydrOXYzine (ATARAX) 10 MG tablet Take 5-10 mg by mouth daily as needed.   omeprazole  (PRILOSEC) 20 MG capsule Take 1 capsule (20 mg total) by mouth daily.   simvastatin  (ZOCOR ) 5 MG tablet Take 1 tablet (5 mg total) by mouth at bedtime.   traZODone  (DESYREL ) 100 MG tablet Take 100 mg by mouth at bedtime.   nitrofurantoin , macrocrystal-monohydrate, (MACROBID ) 100 MG capsule Take 1 capsule (100 mg total) by mouth 2 (two) times daily. (Patient not taking: Reported on 03/14/2024)   tretinoin (RETIN-A) 0.025 % cream daily. (Patient not taking: Reported on 03/14/2024)   No facility-administered encounter medications on file as of 03/14/2024.   Today's Vitals   03/14/24 0913  BP: 138/80  Pulse: 62  Temp: 98.3 F (36.8 C)  TempSrc: Oral  SpO2: 94%  Weight: 129 lb (58.5 kg)  Height: 5' 3 (1.6 m)   Body mass  index is 22.85 kg/m.  Physical Exam GEN: No acute distress. CV: Regular rate and rhythm, no murmurs. LUNGS: Clear to auscultation bilaterally, normal respiratory effort. SKIN JOINTS: Warm and dry, no rash.   Data Reviewed: Imaging: Chest x-ray 01/06/2023-chronic pulmonary hyperinflation, no acute cardiopulmonary abnormality I reviewed the images personally.  PFTs: 03/12/2024 FVC 2.99  [104%], FEV1 2.23 [102%], F/F75, TLC 5.76 [117%], DLCO 17.72 [94%] Normal test  Labs:  Assessment & Plan Screening for chronic respiratory disease Previous chest x-ray in September showed hyperinflation, raising concern for COPD. Pulmonary function test results are normal, ruling out COPD. Hyperinflation is likely benign and not indicative of lung disease. Occasional cough and congestion attributed to acid reflux and seasonal bronchitis. No current respiratory issues requiring intervention. - No further pulmonary follow-up needed unless symptoms develop.   Occasional Dry Cough Recent onset, no other associated symptoms. Possible sensitivity to certain triggers like hot peppers and certain store environments. -Monitor symptoms and discuss further if cough persists or worsens.   Recommendations: Follow-up as needed  Zayde Stroupe MD [facility] 03/14/2024, 9:29 AM  CC: Amey Hossain, MD

## 2024-03-14 NOTE — Patient Instructions (Signed)
  VISIT SUMMARY: You came in for a follow-up regarding a chest x-ray that showed hyperinflation, which raised concerns about COPD. After further evaluation, including a pulmonary function test, it was determined that you do not have COPD. Your occasional cough and congestion are likely due to acid reflux and seasonal bronchitis.  YOUR PLAN: SCREENING FOR CHRONIC RESPIRATORY DISEASE: Your chest x-ray showed hyperinflation, but further tests confirmed that you do not have COPD. The hyperinflation is likely benign. -No further pulmonary follow-up is needed unless you develop new symptoms.  COUGH AND RESPIRATORY SYMPTOMS: Your intermittent cough and congestion are primarily due to acid reflux and seasonal bronchitis. -Continue using your acid reflux medication regularly to manage your cough. -Monitor your symptoms and seek medical advice if they worsen or new symptoms develop.

## 2024-03-20 ENCOUNTER — Ambulatory Visit: Payer: Self-pay

## 2024-03-20 ENCOUNTER — Encounter: Payer: Self-pay | Admitting: Family Medicine

## 2024-03-20 ENCOUNTER — Ambulatory Visit: Admitting: Family Medicine

## 2024-03-20 VITALS — BP 134/80 | HR 55 | Temp 98.4°F | Ht 63.0 in | Wt 129.4 lb

## 2024-03-20 DIAGNOSIS — S00411A Abrasion of right ear, initial encounter: Secondary | ICD-10-CM | POA: Diagnosis not present

## 2024-03-20 DIAGNOSIS — H9221 Otorrhagia, right ear: Secondary | ICD-10-CM

## 2024-03-20 NOTE — Telephone Encounter (Addendum)
 FYI Only or Action Required?: FYI only for provider: appointment scheduled on 03/20/2024 at 3 PM.  Patient was last seen in primary care on 03/04/2024 by Lendia Nordmann L, NP-C.  Called Nurse Triage reporting Ear Drainage.  Symptoms began today.  Interventions attempted: Rest, hydration, or home remedies.  Symptoms are: unchanged.  Triage Disposition: See HCP Within 4 Hours (Or PCP Triage)  Patient/caregiver understands and will follow disposition?: Yes  Copied from CRM #[id number]. Topic: Clinical - Red Word Triage >> Mar 20, 2024 10:24 AM Alfonso ORN wrote: Red Word that prompted transfer to Nurse Triage: pt stated she has blood coming out of ears, ringing in ears prior Reason for Disposition  Diabetes mellitus or weak immune system (e.g., HIV positive, cancer chemo, splenectomy, organ transplant, chronic steroids)  Answer Assessment - Initial Assessment Questions Patient reports bloody ear drainage-started today states her ear felt full and had pressure yesterday.   1. LOCATION: Which ear is involved?      Right ear 2. COLOR: What is the color of the discharge?      blood 3. CONSISTENCY: How runny is the discharge? Could it be water?      blood 4. ONSET: When did you first notice the discharge?     today 5. PAIN: Is there any earache? How bad is it?  (Scale 0-10; none, mild, moderate or severe)     No pain but endorses fullness to t 6. OBJECTS: Have you put anything in your ear? (e.g., Q-tip, other object)      no 7. OTHER SYMPTOMS: Do you have any other symptoms? (e.g., headache, fever, dizziness, vomiting, runny nose)     no  Protocols used: Ear - Discharge-A-AH

## 2024-03-20 NOTE — Progress Notes (Signed)
 Established Patient Office Visit   Subjective  Patient ID: Nancy Smith, female    DOB: [birth year]  Age: 68 y.o. MRN: [id number]  Chief Complaint  Patient presents with   Acute Visit    Patient here because her right ear was bleeding this morning, bleeding stopped and she has been having tinnitus and pressure in her right ear.  Patient's right ear felt full over the weekend.    Pt is a 68 yo female seen by [name], NP who present for acute concern.  Pt with R ear feeling full x 3- 4 days while out of town visiting a friend.  Upon return pt noticed dried blood on Q-tip.  Today noticed ear bleeding.  Lasted ~20 minutes.  Denies pain.  Noticed blood on fingertip upon insertion into ear canal.  Not currently on blood thinners.  Notes history of TMJ and possible vertigo.    Patient Active Problem List   Diagnosis Date Noted   Aortic atherosclerosis 11/24/2023   Osteoporosis without current pathological fracture 11/24/2023   Chronic cough 11/24/2023   Tinnitus of both ears 11/24/2023   Vitamin D  deficiency 04/27/2023   Well woman exam with routine gynecological exam 03/06/2023   Encounter for general adult medical examination with abnormal findings 04/19/2022   Urinary frequency 04/19/2022   Lymphocytic colitis 04/19/2022   Left upper quadrant pain 04/19/2022   Seasonal allergies 11/08/2021   Depression, recurrent 11/08/2021   History of melanoma 09/09/2019   History of basal cell carcinoma of skin 09/09/2019   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   Osteopenia 09/09/2019   Dense breast tissue on mammogram 09/09/2019   Hyperlipidemia 09/09/2019   History of colon polyps 09/09/2019   Hemangioma of liver 09/09/2019   Hepatic cyst 09/09/2019   History of posterior vitreous detachment 09/09/2019   Anxiety and depression 09/02/2019   Gastroesophageal reflux disease 09/02/2019   Past Medical History:  Diagnosis Date   Bronchitis    Colitis    Lymphocytic colitis; had 2  flares in her lifetime   Depression    GERD (gastroesophageal reflux disease)    Hx of appendicitis    Hyperlipidemia    Past Surgical History:  Procedure Laterality Date   APPENDECTOMY     EYE SURGERY  1996   HERNIA REPAIR  1983   Femoral   MOHS SURGERY     for Melanoma   TONSILLECTOMY  1960   Social History   Tobacco Use   Smoking status: Former    Current packs/day: 0.00    Average packs/day: 0.3 packs/day for 15.0 years (3.8 ttl pk-yrs)    Types: Cigarettes    Start date: 21    Quit date: 1994    Years since quitting: 31.9   Smokeless tobacco: Never  Vaping Use   Vaping status: Never Used  Substance Use Topics   Alcohol use: Not Currently   Drug use: Never   Family History  Problem Relation Age of Onset   Heart failure Mother    Lung cancer Mother        Smoker   CAD Mother        s/p bypass   Osteoporosis Mother    Heart disease Mother    Heart failure Father    Hyperlipidemia Father    Heart disease Father    Atrial fibrillation Father    Heart attack Father    Hypertension Father    Congestive Heart Failure Brother    Heart attack Brother  Heart disease Brother    Heart failure Brother    Hypertension Brother    Liver disease Neg Hx    Esophageal cancer Neg Hx    Colon cancer Neg Hx    Breast cancer Neg Hx    BRCA 1/2 Neg Hx    No Known Allergies  ROS Negative unless stated above    Objective:     BP 134/80 (BP Location: Left Arm, Patient Position: Sitting, Cuff Size: Normal)   Pulse (!) 55   Temp 98.4 F (36.9 C) (Oral)   Ht 5' 3 (1.6 m)   Wt 129 lb 6.4 oz (58.7 kg)   SpO2 90%   BMI 22.92 kg/m  BP Readings from Last 3 Encounters:  03/20/24 134/80  03/14/24 138/80  03/05/24 134/78   Wt Readings from Last 3 Encounters:  03/20/24 129 lb 6.4 oz (58.7 kg)  03/14/24 129 lb (58.5 kg)  03/05/24 126 lb (57.2 kg)      Physical Exam Constitutional:      General: She is not in acute distress.    Appearance: Normal  appearance.  HENT:     Head: Normocephalic and atraumatic.     Right Ear: Hearing, tympanic membrane and external ear normal.     Left Ear: Hearing, tympanic membrane, ear canal and external ear normal.     Ears:     Comments: Scant cerumen in bilateral ear canals.  Abrasion in distal right ear canal approximately 5 mm in length with fresh blood present, hemostatic.  No eschar noted.    Nose: Nose normal.     Mouth/Throat:     Mouth: Mucous membranes are moist.  Cardiovascular:     Rate and Rhythm: Normal rate and regular rhythm.     Heart sounds: Normal heart sounds. No murmur heard.    No gallop.  Pulmonary:     Effort: Pulmonary effort is normal. No respiratory distress.     Breath sounds: Normal breath sounds. No wheezing, rhonchi or rales.  Skin:    General: Skin is warm and dry.  Neurological:     Mental Status: She is alert and oriented to person, place, and time.        03/04/2024    2:55 PM 03/04/2024    2:54 PM 11/24/2023    1:04 PM  Depression screen PHQ 2/9  Decreased Interest 0 0 0  Down, Depressed, Hopeless 0 0 0  PHQ - 2 Score 0 0 0  Altered sleeping 0    Tired, decreased energy 0    Change in appetite 0    Feeling bad or failure about yourself  0    Trouble concentrating 0    Moving slowly or fidgety/restless 0    Suicidal thoughts 0    PHQ-9 Score 0    Difficult doing work/chores Not difficult at all        03/04/2024    2:55 PM 02/17/2021   11:04 AM 08/11/2020    4:03 PM  GAD 7 : Generalized Anxiety Score  Nervous, Anxious, on Edge 0 1 3  Control/stop worrying 0 1 1  Worry too much - different things 0 1 2  Trouble relaxing 0 0 0  Restless 0 0 0  Easily annoyed or irritable 0 0 0  Afraid - awful might happen 0 1 0  Total GAD 7 Score 0 4 6  Anxiety Difficulty Not difficult at all Somewhat difficult Not difficult at all     No results found  for any visits on 03/20/24.    Assessment & Plan:   Blood in right ear canal  Abrasion of right  ear canal, initial encounter  New problem.  Blood in R ear canal due to abrasion.  May have scratched ear canal with finger nail at some point. Avoid digging in ear/using Q-tip as may disrupt clot formation.  TM without s/s of infection.  Given precautions.  Return if symptoms worsen or fail to improve.   Clotilda JONELLE Single, MD

## 2024-04-05 ENCOUNTER — Encounter: Payer: Self-pay | Admitting: Pharmacist

## 2024-04-05 NOTE — Progress Notes (Signed)
 Pharmacy Quality Measure Review  This patient is appearing on a report for being at risk of failing the adherence measure for cholesterol (statin) medications this calendar year.   Medication: Simvastatin  Last fill date: 12/26/23 for 90 day supply  Contacted pharmacy to facilitate refills.  Darrelyn Drum, PharmD, BCPS, CPP Clinical Pharmacist Practitioner [facility] [phone]

## 2024-04-12 ENCOUNTER — Telehealth: Payer: Self-pay

## 2024-04-12 NOTE — Telephone Encounter (Signed)
 Copied from CRM [id number]. Topic: Clinical - Request for Lab/Test Order >> Apr 12, 2024 11:18 AM Nancy Smith wrote: Reason for CRM: Patient would like order put in for cholesterol check, Please call her at [phone] to schedule when lab order is in. She would like it done before her appointment 04/15/24.

## 2024-04-12 NOTE — Telephone Encounter (Signed)
 Looks like lab orders are still in from September. Keep those and ok for her to come in?

## 2024-04-15 ENCOUNTER — Other Ambulatory Visit (INDEPENDENT_AMBULATORY_CARE_PROVIDER_SITE_OTHER)

## 2024-04-15 DIAGNOSIS — Z79899 Other long term (current) drug therapy: Secondary | ICD-10-CM

## 2024-04-15 DIAGNOSIS — E7800 Pure hypercholesterolemia, unspecified: Secondary | ICD-10-CM

## 2024-04-15 DIAGNOSIS — M810 Age-related osteoporosis without current pathological fracture: Secondary | ICD-10-CM

## 2024-04-15 DIAGNOSIS — I700 Atherosclerosis of aorta: Secondary | ICD-10-CM | POA: Diagnosis not present

## 2024-04-15 LAB — CBC WITH DIFFERENTIAL/PLATELET
Basophils Absolute: 0.1 K/uL (ref 0.0–0.1)
Basophils Relative: 1.3 % (ref 0.0–3.0)
Eosinophils Absolute: 0.2 K/uL (ref 0.0–0.7)
Eosinophils Relative: 4.7 % (ref 0.0–5.0)
HCT: 37.7 % (ref 36.0–46.0)
Hemoglobin: 12.7 g/dL (ref 12.0–15.0)
Lymphocytes Relative: 34.9 % (ref 12.0–46.0)
Lymphs Abs: 1.7 K/uL (ref 0.7–4.0)
MCHC: 33.6 g/dL (ref 30.0–36.0)
MCV: 85.8 fl (ref 78.0–100.0)
Monocytes Absolute: 0.4 K/uL (ref 0.1–1.0)
Monocytes Relative: 8.1 % (ref 3.0–12.0)
Neutro Abs: 2.5 K/uL (ref 1.4–7.7)
Neutrophils Relative %: 51 % (ref 43.0–77.0)
Platelets: 245 K/uL (ref 150.0–400.0)
RBC: 4.4 Mil/uL (ref 3.87–5.11)
RDW: 13 % (ref 11.5–15.5)
WBC: 4.9 K/uL (ref 4.0–10.5)

## 2024-04-15 LAB — COMPREHENSIVE METABOLIC PANEL WITH GFR
ALT: 10 U/L (ref 3–35)
AST: 16 U/L (ref 5–37)
Albumin: 4.4 g/dL (ref 3.5–5.2)
Alkaline Phosphatase: 42 U/L (ref 39–117)
BUN: 11 mg/dL (ref 6–23)
CO2: 31 meq/L (ref 19–32)
Calcium: 9.2 mg/dL (ref 8.4–10.5)
Chloride: 103 meq/L (ref 96–112)
Creatinine, Ser: 0.73 mg/dL (ref 0.40–1.20)
GFR: 83.28 mL/min
Glucose, Bld: 92 mg/dL (ref 70–99)
Potassium: 4.1 meq/L (ref 3.5–5.1)
Sodium: 139 meq/L (ref 135–145)
Total Bilirubin: 0.5 mg/dL (ref 0.2–1.2)
Total Protein: 6.8 g/dL (ref 6.0–8.3)

## 2024-04-15 LAB — LIPID PANEL
Cholesterol: 181 mg/dL (ref 28–200)
HDL: 56.6 mg/dL
LDL Cholesterol: 112 mg/dL — ABNORMAL HIGH (ref 10–99)
NonHDL: 124.72
Total CHOL/HDL Ratio: 3
Triglycerides: 64 mg/dL (ref 10.0–149.0)
VLDL: 12.8 mg/dL (ref 0.0–40.0)

## 2024-04-15 NOTE — Telephone Encounter (Signed)
 Called and informed pt lab orders are in, pt states she will come in and have them done this morning so we should have them by tomorrow for her visit

## 2024-04-16 ENCOUNTER — Ambulatory Visit: Admitting: Family Medicine

## 2024-04-16 ENCOUNTER — Encounter: Payer: Self-pay | Admitting: Family Medicine

## 2024-04-16 VITALS — BP 126/82 | HR 70 | Temp 97.6°F | Ht 63.0 in | Wt 121.0 lb

## 2024-04-16 DIAGNOSIS — H9191 Unspecified hearing loss, right ear: Secondary | ICD-10-CM

## 2024-04-16 DIAGNOSIS — H9311 Tinnitus, right ear: Secondary | ICD-10-CM | POA: Diagnosis not present

## 2024-04-16 DIAGNOSIS — I700 Atherosclerosis of aorta: Secondary | ICD-10-CM

## 2024-04-16 DIAGNOSIS — R053 Chronic cough: Secondary | ICD-10-CM | POA: Diagnosis not present

## 2024-04-16 DIAGNOSIS — E7800 Pure hypercholesterolemia, unspecified: Secondary | ICD-10-CM | POA: Diagnosis not present

## 2024-04-16 DIAGNOSIS — M810 Age-related osteoporosis without current pathological fracture: Secondary | ICD-10-CM

## 2024-04-16 MED ORDER — SIMVASTATIN 10 MG PO TABS: 10.0000 mg | ORAL_TABLET | Freq: Every day | ORAL | 0 refills | Status: AC

## 2024-04-16 MED ORDER — SIMVASTATIN 10 MG PO TABS: 10.0000 mg | ORAL_TABLET | Freq: Every day | ORAL | 0 refills | Status: DC

## 2024-04-16 NOTE — Progress Notes (Signed)
 Discussed in person. Increased simvastatin  and she will stop ice cream. Follow up fasting in 8 weeks.

## 2024-04-16 NOTE — Progress Notes (Signed)
 "  Subjective:     Patient ID: Nancy Smith, female    DOB: 1955-07-31, 69 y.o.   MRN: [id number]  Chief Complaint  Patient presents with   Medical Management of Chronic Issues    HPI  Discussed the use of AI scribe software for clinical note transcription with the patient, who gave verbal consent to proceed.  History of Present Illness Nancy Smith is a 69 year old female with hyperlipidemia, aortic atherosclerosis, and elevated CT coronary calcium  score who presents for follow-up on chronic health conditions.  Hyperlipidemia and atherosclerotic cardiovascular disease risk - Takes simvastatin  5 mg daily, well tolerated. - Previously did not tolerate rosuvastatin . - Takes simvastatin  in the morning due to prior sleep disturbance with other statins. - Consumes ice cream daily and expresses concern about its impact on cholesterol. - History of aortic atherosclerosis and elevated CT coronary calcium  score.  Chronic cough and gastroesophageal reflux symptoms - Mild chronic cough attributed to acid reflux. - COPD previously ruled out. - Cough is not bothersome.  Osteoporosis management - Takes alendronate  for osteoporosis.  Right ear fullness and hearing changes - Fullness, ringing, and slightly muffled hearing in the right ear. - Symptoms recently worsened with use of pain relievers containing acetaminophen and ibuprofen. - History of TMJ  Scoliosis and musculoskeletal discomfort - Scoliosis with recent worsening discomfort. - Inconsistent with physical therapy exercises. - Uses over-the-counter pain medications for relief.     Health Maintenance Due  Topic Date Due   Pneumococcal Vaccine: 50+ Years (2 of 2 - PPSV23, PCV20, or PCV21) 04/19/2019   DTaP/Tdap/Td (2 - Tdap) 10/20/2020   Medicare Annual Wellness (AWV)  04/20/2023   Colonoscopy  04/11/2024    Past Medical History:  Diagnosis Date   Bronchitis    Colitis    Lymphocytic colitis; had 2 flares in her  lifetime   Depression    GERD (gastroesophageal reflux disease)    Hx of appendicitis    Hyperlipidemia     Past Surgical History:  Procedure Laterality Date   APPENDECTOMY     EYE SURGERY  1996   HERNIA REPAIR  1983   Femoral   MOHS SURGERY     for Melanoma   TONSILLECTOMY  1960    Family History  Problem Relation Age of Onset   Heart failure Mother    Lung cancer Mother        Smoker   CAD Mother        s/p bypass   Osteoporosis Mother    Heart disease Mother    Heart failure Father    Hyperlipidemia Father    Heart disease Father    Atrial fibrillation Father    Heart attack Father    Hypertension Father    Congestive Heart Failure Brother    Heart attack Brother    Heart disease Brother    Heart failure Brother    Hypertension Brother    Liver disease Neg Hx    Esophageal cancer Neg Hx    Colon cancer Neg Hx    Breast cancer Neg Hx    BRCA 1/2 Neg Hx     Social History   Socioeconomic History   Marital status: Married    Spouse name: Not on file   Number of children: 0   Years of education: Not on file   Highest education level: Master's degree (e.g., MA, MS, MEng, MEd, MSW, MBA)  Occupational History   Occupation: retired  Tobacco Use   Smoking  status: Former    Current packs/day: 0.00    Average packs/day: 0.3 packs/day for 15.0 years (3.8 ttl pk-yrs)    Types: Cigarettes    Start date: 41    Quit date: 54    Years since quitting: 32.0   Smokeless tobacco: Never  Vaping Use   Vaping status: Never Used  Substance and Sexual Activity   Alcohol use: Not Currently   Drug use: Never   Sexual activity: Not Currently    Comment: More than 5 parnters, Younger than 16 IC, No STD, No abd pap, no des exposure, no breast cancer meds  Other Topics Concern   Not on file  Social History Narrative   Are you right handed or left handed? Right Handed   Are you currently employed ? 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 No    Who lives with you? Husband and a dog    What type of home do you live in: 1 story or 2 story? Lives in a condo on the 3rd floor. Has an elevator is needed.       Social Drivers of Health   Tobacco Use: Medium Risk (04/16/2024)   Patient History    Smoking Tobacco Use: Former    Smokeless Tobacco Use: Never    Passive Exposure: Not on file  Financial Resource Strain: Low Risk (03/04/2024)   Overall Financial Resource Strain (CARDIA)    Difficulty of Paying Living Expenses: Not hard at all  Food Insecurity: No Food Insecurity (03/04/2024)   Epic    Worried About Programme Researcher, Broadcasting/film/video in the Last Year: Never true    Ran Out of Food in the Last Year: Never true  Transportation Needs: No Transportation Needs (03/04/2024)   Epic    Lack of Transportation (Medical): No    Lack of Transportation (Non-Medical): No  Physical Activity: Insufficiently Active (03/04/2024)   Exercise Vital Sign    Days of Exercise per Week: 3 days    Minutes of Exercise per Session: 30 min  Stress: No Stress Concern Present (03/04/2024)   [employer] of Occupational [employer] - Occupational Stress Questionnaire    Feeling of Stress: Only a little  Social Connections: Moderately Integrated (03/04/2024)   Social Connection and Isolation Panel    Frequency of Communication with Friends and Family: More than three times a week    Frequency of Social Gatherings with Friends and Family: Patient declined    Attends Religious Services: Patient declined    Database Administrator or Organizations: Yes    Attends Banker Meetings: Patient declined    Marital Status: Married  Catering Manager Violence: Not At Risk (04/19/2022)   Humiliation, Afraid, Rape, and Kick questionnaire    Fear of Current or Ex-Partner: No    Emotionally Abused: No    Physically Abused: No    Sexually Abused: No  Depression (PHQ2-9): Low Risk (03/04/2024)   Depression (PHQ2-9)    PHQ-2 Score: 0   Alcohol Screen: Low Risk (04/19/2022)   Alcohol Screen    Last Alcohol Screening Score (AUDIT): 0  Housing: Low Risk (03/04/2024)   Epic    Unable to Pay for Housing in the Last Year: No    Number of Times Moved in the Last Year: 0    Homeless in the Last Year: No  Utilities: Not At Risk (04/19/2022)   [facility] Utilities    Threatened  with loss of utilities: No  Health Literacy: Not on file    Outpatient Medications Prior to Visit  Medication Sig Dispense Refill   alendronate  (FOSAMAX ) 70 MG tablet Take 1 tablet (70 mg total) by mouth every 7 (seven) days. Take with a full glass of water on an empty stomach. 4 tablet 11   baclofen (LIORESAL) 10 MG tablet Take by mouth as needed for muscle spasms.     cetirizine  (ZYRTEC  ALLERGY) 10 MG tablet Take 1 tablet (10 mg total) by mouth daily. (Patient taking differently: Take 10 mg by mouth daily. PRN) 90 tablet 3   DULoxetine (CYMBALTA) 60 MG capsule Take 60 mg by mouth every morning.     hydrOXYzine (ATARAX) 10 MG tablet Take 5-10 mg by mouth daily as needed.     omeprazole  (PRILOSEC) 20 MG capsule Take 1 capsule (20 mg total) by mouth daily. 30 capsule 3   traZODone  (DESYREL ) 100 MG tablet Take 100 mg by mouth at bedtime.     tretinoin (RETIN-A) 0.025 % cream daily.     nitrofurantoin , macrocrystal-monohydrate, (MACROBID ) 100 MG capsule Take 1 capsule (100 mg total) by mouth 2 (two) times daily. (Patient not taking: Reported on 03/20/2024) 10 capsule 0   simvastatin  (ZOCOR ) 5 MG tablet Take 1 tablet (5 mg total) by mouth at bedtime. 90 tablet 3   No facility-administered medications prior to visit.    Allergies[1]  Review of Systems  Constitutional:  Negative for chills, fever and malaise/fatigue.  HENT:  Positive for hearing loss and tinnitus. Negative for congestion, sinus pain and sore throat.   Eyes:  Negative for blurred vision and double vision.  Respiratory:  Positive for cough. Negative for sputum production, shortness of breath and  wheezing.   Cardiovascular:  Negative for chest pain, palpitations and leg swelling.  Gastrointestinal:  Negative for abdominal pain, constipation, diarrhea, nausea and vomiting.  Musculoskeletal:  Positive for back pain.  Neurological:  Negative for dizziness, tingling, focal weakness and headaches.       Objective:    Physical Exam Constitutional:      General: She is not in acute distress.    Appearance: She is not ill-appearing.  HENT:     Right Ear: Decreased hearing noted. No mastoid tenderness.     Left Ear: Tympanic membrane, ear canal and external ear normal.     Ears:     Comments: Right ear canal with scant amount of cerumen. TM with minimal bulge compared to left without infection or perforation. Gross hearing decreased on right.     Mouth/Throat:     Mouth: Mucous membranes are moist.     Pharynx: Oropharynx is clear.  Eyes:     Extraocular Movements: Extraocular movements intact.     Conjunctiva/sclera: Conjunctivae normal.  Neck:     Vascular: No carotid bruit.  Cardiovascular:     Rate and Rhythm: Normal rate and regular rhythm.  Pulmonary:     Effort: Pulmonary effort is normal.     Breath sounds: Normal breath sounds.  Musculoskeletal:     Cervical back: Normal range of motion and neck supple. No tenderness.     Right lower leg: No edema.     Left lower leg: No edema.  Lymphadenopathy:     Cervical: No cervical adenopathy.  Skin:    General: Skin is warm and dry.  Neurological:     General: No focal deficit present.     Mental Status: She is alert and oriented to  person, place, and time.     Cranial Nerves: No cranial nerve deficit.     Motor: No weakness.     Coordination: Coordination normal.     Gait: Gait normal.  Psychiatric:        Mood and Affect: Mood normal.        Behavior: Behavior normal.        Thought Content: Thought content normal.      BP 126/82   Pulse 70   Temp 97.6 F (36.4 C) (Temporal)   Ht 5' 3 (1.6 m)   Wt 121 lb  (54.9 kg)   SpO2 95%   BMI 21.43 kg/m  Wt Readings from Last 3 Encounters:  04/16/24 121 lb (54.9 kg)  03/20/24 129 lb 6.4 oz (58.7 kg)  03/14/24 129 lb (58.5 kg)       Assessment & Plan:   Problem List Items Addressed This Visit     Aortic atherosclerosis   Relevant Medications   simvastatin  (ZOCOR ) 10 MG tablet   Chronic cough   Hyperlipidemia   Relevant Medications   simvastatin  (ZOCOR ) 10 MG tablet   Osteoporosis without current pathological fracture   Other Visit Diagnoses       Tinnitus of right ear    -  Primary   Relevant Orders   Ambulatory referral to ENT     Decreased hearing of right ear       Relevant Orders   Ambulatory referral to ENT       Assessment and Plan Assessment & Plan Aortic atherosclerosis Elevated CT coronary calcium  score, HLD- Currently managed with simvastatin  but not at goal. - Increased simvastatin  to 10 mg daily. - Will recheck cholesterol levels in 8 weeks. - Will consider Repatha if simvastatin  is not tolerated.  Hypercholesterolemia Cholesterol levels have increased. Currently on simvastatin  5 mg daily, now increased to 10 mg daily due to tolerance and elevated cholesterol levels. Dietary habits, including daily ice cream consumption, may contribute to elevated cholesterol. - Increased simvastatin  to 10 mg daily. - Advised dietary modifications, including reducing ice cream consumption. - Will recheck cholesterol levels in 8 weeks.  Osteoporosis Managed with alendronate .  Chronic cough Previously evaluated by pulmonology and ruled out as COPD. Likely related to acid reflux, managed with medication.  Right ear tinnitus and fullness Right ear tinnitus and fullness with a history of ear ?abrasion and seen in another office. No significant wax buildup. ENT referral planned for further evaluation. - Referred to ENT for further evaluation.      I have discontinued Chandelle Gulley's nitrofurantoin  (macrocrystal-monohydrate).  I am also having her maintain her cetirizine , traZODone , omeprazole , alendronate , baclofen, DULoxetine, hydrOXYzine, tretinoin, and simvastatin .  Meds ordered this encounter  Medications   DISCONTD: simvastatin  (ZOCOR ) 10 MG tablet    Sig: Take 1 tablet (10 mg total) by mouth at bedtime.    Dispense:  90 tablet    Refill:  0    Supervising Provider:   ROLLENE NORRIS A [[id number]]   simvastatin  (ZOCOR ) 10 MG tablet    Sig: Take 1 tablet (10 mg total) by mouth at bedtime.    Dispense:  90 tablet    Refill:  0       [1] No Known Allergies  "

## 2024-04-19 ENCOUNTER — Encounter: Payer: Self-pay | Admitting: Family Medicine

## 2024-04-29 ENCOUNTER — Ambulatory Visit (INDEPENDENT_AMBULATORY_CARE_PROVIDER_SITE_OTHER): Admitting: Physician Assistant

## 2024-04-29 ENCOUNTER — Encounter (INDEPENDENT_AMBULATORY_CARE_PROVIDER_SITE_OTHER): Payer: Self-pay | Admitting: Physician Assistant

## 2024-04-29 VITALS — BP 121/71 | HR 60 | Temp 98.3°F | Ht 63.0 in | Wt 122.0 lb

## 2024-04-29 DIAGNOSIS — H9311 Tinnitus, right ear: Secondary | ICD-10-CM | POA: Diagnosis not present

## 2024-04-29 DIAGNOSIS — H938X1 Other specified disorders of right ear: Secondary | ICD-10-CM

## 2024-04-29 NOTE — Progress Notes (Unsigned)
.  [id number]

## 2024-04-30 NOTE — Progress Notes (Signed)
 Dear Dr. Lendia, Here is my assessment for our mutual patient, Nancy Smith. Thank you for allowing me the opportunity to care for your patient. Please do not hesitate to contact me should you have any other questions. Sincerely, Chyrl Cohen PA-C  Otolaryngology Clinic Note Referring provider: Dr. Lendia HPI:  Nancy Smith is a 69 y.o. female kindly referred by Dr. Lendia   Discussed the use of AI scribe software for clinical note transcription with the patient, who gave verbal consent to proceed.  History of Present Illness    Nancy Smith is a 69 year old female with temporomandibular joint dysfunction who presents with intermittent right ear fullness and tinnitus.  She reports intermittent episodes of right ear fullness occurring several times per week, described as a non-painful but bothersome sensation. Episodes typically do not last a full day and can be exhausting if prolonged. The fullness is occasionally relieved by a muffled sound or popping sensation, after which the ear feels clearer, though she has not experienced this recently. She denies hearing loss, otalgia, pruritus, or otic clicking. There has been no recent travel.  She has persistent right-sided tinnitus for several years, with increased awareness of ear fullness over the past year. She reports that her ear ringing worsened after taking over-the-counter medications containing acetaminophen and aspirin, so she discontinued them. She does not use regular allergy medications but keeps Zyrtec  available for hives, though she has not used it recently.  She experiences brief episodes of dizziness lasting a few seconds, typically triggered by rapid movement. These episodes are not associated with increased tinnitus, hearing loss, or other neurological symptoms. She has a lifelong history of motion sickness, including car sickness and intolerance to swings.  She has longstanding temporomandibular joint dysfunction, previously  associated with jaw pain, popping, and difficulty opening her mouth, though these symptoms have improved. She currently notes bilateral TMJ tenderness, which she distinguishes from her ear symptoms. She suspects a possible relationship between ear fullness and TMJ dysfunction, but does not associate tinnitus with jaw symptoms.           Independent Review of Additional Tests or Records:  PCP office noted 04/16/2024   PMH/Meds/All/SocHx/FamHx/ROS:   Past Medical History:  Diagnosis Date   Bronchitis    Colitis    Lymphocytic colitis; had 2 flares in her lifetime   Depression    GERD (gastroesophageal reflux disease)    Hx of appendicitis    Hyperlipidemia      Past Surgical History:  Procedure Laterality Date   APPENDECTOMY     EYE SURGERY  1996   HERNIA REPAIR  1983   Femoral   MOHS SURGERY     for Melanoma   TONSILLECTOMY  1960    Family History  Problem Relation Age of Onset   Heart failure Mother    Lung cancer Mother        Smoker   CAD Mother        s/p bypass   Osteoporosis Mother    Heart disease Mother    Heart failure Father    Hyperlipidemia Father    Heart disease Father    Atrial fibrillation Father    Heart attack Father    Hypertension Father    Congestive Heart Failure Brother    Heart attack Brother    Heart disease Brother    Heart failure Brother    Hypertension Brother    Liver disease Neg Hx    Esophageal cancer Neg Hx  Colon cancer Neg Hx    Breast cancer Neg Hx    BRCA 1/2 Neg Hx      Social Connections: Moderately Integrated (03/04/2024)   Social Connection and Isolation Panel    Frequency of Communication with Friends and Family: More than three times a week    Frequency of Social Gatherings with Friends and Family: Patient declined    Attends Religious Services: Patient declined    Database Administrator or Organizations: Yes    Attends Banker Meetings: Patient declined    Marital Status: Married     Current  Medications[1]   Physical Exam:   BP 121/71   Pulse 60   Temp 98.3 F (36.8 C)   Ht 5' 3 (1.6 m)   Wt 122 lb (55.3 kg)   SpO2 98%   BMI 21.61 kg/m   Pertinent Findings  CN II-XII grossly intact Bilateral EAC clear and TM intact with well pneumatized middle ear spaces Anterior rhinoscopy: Septum midline; bilateral inferior turbinates with no hypertrophy  No lesions of oral cavity/oropharynx; dentition wnl No obviously palpable neck masses/lymphadenopathy/thyromegaly No respiratory distress or stridor Seprately Identifiable Procedures:  None  Impression & Plans:  Nancy Smith is a 69 y.o. female with the following   Assessment and Plan    Right ear fullness and tinnitus Intermittent right ear fullness and longstanding tinnitus without hearing loss, otalgia, or significant vertigo. Differential includes eustachian tube dysfunction, middle ear pressure issues, TMJ-related symptoms, and low likelihood of serious pathology. Aspirin and acetaminophen exacerbated tinnitus; symptoms reduced upon discontinuation. Empiric allergy treatment deferred. - Ordered audiometry to assess hearing loss and further characterize etiology. - Scheduled follow-up phone visit to review audiometry results and discuss management. - Discussed potential for further imaging (MRI or CT) if audiometry is normal, but not immediately indicated. - Reviewed empiric trial of allergy medications (loratadine or cetirizine ), fluticasone  nasal spray, and saline irrigation as low-risk options if symptoms worsen; deferred initiation. - Provided anticipatory guidance on benign nature of symptoms and plan to reassess post-audiometry.           - f/u Phone office visit with audio results    Thank you for allowing me the opportunity to care for your patient. Please do not hesitate to contact me should you have any other questions.  Sincerely, Chyrl Cohen PA-C [facility] Phone: [phone] Fax:  [phone]  04/30/2024, 10:19 AM        [1]  Current Outpatient Medications:    alendronate  (FOSAMAX ) 70 MG tablet, Take 1 tablet (70 mg total) by mouth every 7 (seven) days. Take with a full glass of water on an empty stomach., Disp: 4 tablet, Rfl: 11   baclofen (LIORESAL) 10 MG tablet, Take by mouth as needed for muscle spasms., Disp: , Rfl:    cetirizine  (ZYRTEC  ALLERGY) 10 MG tablet, Take 1 tablet (10 mg total) by mouth daily. (Patient taking differently: Take 10 mg by mouth daily. PRN), Disp: 90 tablet, Rfl: 3   DULoxetine (CYMBALTA) 60 MG capsule, Take 60 mg by mouth every morning., Disp: , Rfl:    hydrOXYzine (ATARAX) 10 MG tablet, Take 5-10 mg by mouth daily as needed., Disp: , Rfl:    omeprazole  (PRILOSEC) 20 MG capsule, Take 1 capsule (20 mg total) by mouth daily., Disp: 30 capsule, Rfl: 3   simvastatin  (ZOCOR ) 10 MG tablet, Take 1 tablet (10 mg total) by mouth at bedtime., Disp: 90 tablet, Rfl: 0   traZODone  (DESYREL ) 100 MG  tablet, Take 100 mg by mouth at bedtime., Disp: , Rfl:    tretinoin (RETIN-A) 0.025 % cream, daily., Disp: , Rfl:

## 2024-05-03 ENCOUNTER — Ambulatory Visit (INDEPENDENT_AMBULATORY_CARE_PROVIDER_SITE_OTHER): Admitting: Audiology

## 2024-05-07 ENCOUNTER — Ambulatory Visit

## 2024-05-08 ENCOUNTER — Other Ambulatory Visit: Payer: Self-pay | Admitting: Family Medicine

## 2024-05-13 ENCOUNTER — Ambulatory Visit: Admitting: Neurology

## 2024-06-05 ENCOUNTER — Ambulatory Visit (INDEPENDENT_AMBULATORY_CARE_PROVIDER_SITE_OTHER): Admitting: Audiology
